# Patient Record
Sex: Female | Born: 1935 | Race: White | Hispanic: No | Marital: Married | Smoking: Never smoker
Health system: Southern US, Community
[De-identification: ages and names within clinical notes are randomized; demographics above are authoritative.]

## PROBLEM LIST (undated history)

## (undated) DIAGNOSIS — Z8669 Personal history of other diseases of the nervous system and sense organs: Secondary | ICD-10-CM

## (undated) DIAGNOSIS — K5190 Ulcerative colitis, unspecified, without complications: Secondary | ICD-10-CM

## (undated) DIAGNOSIS — B0229 Other postherpetic nervous system involvement: Secondary | ICD-10-CM

## (undated) DIAGNOSIS — I10 Essential (primary) hypertension: Secondary | ICD-10-CM

## (undated) DIAGNOSIS — I341 Nonrheumatic mitral (valve) prolapse: Secondary | ICD-10-CM

## (undated) DIAGNOSIS — H3530 Unspecified macular degeneration: Secondary | ICD-10-CM

## (undated) DIAGNOSIS — R002 Palpitations: Secondary | ICD-10-CM

## (undated) DIAGNOSIS — E785 Hyperlipidemia, unspecified: Secondary | ICD-10-CM

## (undated) DIAGNOSIS — C9000 Multiple myeloma not having achieved remission: Secondary | ICD-10-CM

## (undated) DIAGNOSIS — Z8619 Personal history of other infectious and parasitic diseases: Secondary | ICD-10-CM

## (undated) DIAGNOSIS — M1990 Unspecified osteoarthritis, unspecified site: Secondary | ICD-10-CM

## (undated) HISTORY — DX: Nonrheumatic mitral (valve) prolapse: I34.1

## (undated) HISTORY — DX: Essential (primary) hypertension: I10

## (undated) HISTORY — DX: Personal history of other infectious and parasitic diseases: Z86.19

## (undated) HISTORY — DX: Unspecified osteoarthritis, unspecified site: M19.90

## (undated) HISTORY — DX: Hyperlipidemia, unspecified: E78.5

## (undated) HISTORY — DX: Other postherpetic nervous system involvement: B02.29

## (undated) HISTORY — DX: Personal history of other diseases of the nervous system and sense organs: Z86.69

## (undated) HISTORY — DX: Multiple myeloma not having achieved remission: C90.00

## (undated) HISTORY — DX: Unspecified macular degeneration: H35.30

## (undated) HISTORY — DX: Palpitations: R00.2

## (undated) HISTORY — DX: Ulcerative colitis, unspecified, without complications: K51.90

---
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
## 1973-05-10 HISTORY — PX: BREAST BIOPSY: SHX20

## 2004-05-06 ENCOUNTER — Ambulatory Visit: Payer: Self-pay | Admitting: Gastroenterology

## 2004-08-18 ENCOUNTER — Ambulatory Visit: Payer: Self-pay | Admitting: Gastroenterology

## 2004-09-17 ENCOUNTER — Ambulatory Visit: Payer: Self-pay | Admitting: Gastroenterology

## 2004-09-28 ENCOUNTER — Ambulatory Visit: Payer: Self-pay | Admitting: Gastroenterology

## 2004-09-28 ENCOUNTER — Encounter (INDEPENDENT_AMBULATORY_CARE_PROVIDER_SITE_OTHER): Payer: Self-pay | Admitting: Specialist

## 2004-10-30 ENCOUNTER — Ambulatory Visit: Payer: Self-pay | Admitting: Gastroenterology

## 2007-05-26 ENCOUNTER — Ambulatory Visit: Payer: Self-pay | Admitting: Gastroenterology

## 2009-05-01 ENCOUNTER — Encounter: Payer: Self-pay | Admitting: Gastroenterology

## 2009-05-10 HISTORY — PX: COLONOSCOPY: SHX174

## 2009-10-14 ENCOUNTER — Ambulatory Visit: Payer: Self-pay | Admitting: Gastroenterology

## 2009-10-14 DIAGNOSIS — K5120 Ulcerative (chronic) proctitis without complications: Secondary | ICD-10-CM

## 2009-10-14 DIAGNOSIS — Z8679 Personal history of other diseases of the circulatory system: Secondary | ICD-10-CM | POA: Insufficient documentation

## 2009-10-14 DIAGNOSIS — K5130 Ulcerative (chronic) rectosigmoiditis without complications: Secondary | ICD-10-CM | POA: Insufficient documentation

## 2009-10-21 ENCOUNTER — Telehealth: Payer: Self-pay | Admitting: Gastroenterology

## 2009-11-03 ENCOUNTER — Encounter: Payer: Self-pay | Admitting: Gastroenterology

## 2009-11-13 ENCOUNTER — Ambulatory Visit: Payer: Self-pay | Admitting: Gastroenterology

## 2009-11-18 ENCOUNTER — Telehealth: Payer: Self-pay | Admitting: Gastroenterology

## 2009-11-19 ENCOUNTER — Encounter (INDEPENDENT_AMBULATORY_CARE_PROVIDER_SITE_OTHER): Payer: Self-pay | Admitting: *Deleted

## 2009-11-20 ENCOUNTER — Ambulatory Visit: Payer: Self-pay | Admitting: Gastroenterology

## 2009-11-24 ENCOUNTER — Ambulatory Visit: Payer: Self-pay | Admitting: Gastroenterology

## 2009-11-25 ENCOUNTER — Telehealth (INDEPENDENT_AMBULATORY_CARE_PROVIDER_SITE_OTHER): Payer: Self-pay | Admitting: *Deleted

## 2009-11-25 ENCOUNTER — Telehealth: Payer: Self-pay | Admitting: Gastroenterology

## 2009-11-26 ENCOUNTER — Encounter: Payer: Self-pay | Admitting: Gastroenterology

## 2010-05-08 ENCOUNTER — Ambulatory Visit: Payer: Self-pay | Admitting: Cardiology

## 2010-06-09 NOTE — Progress Notes (Signed)
Summary: Rowasa enema change  Phone Note Outgoing Call   Summary of Call: Received fax from [facility] requesting change from Rowasa enema to Mesalamine.  States pt can save $5.04 per year.  LM for pt to call to see if she wants to change. Initial call taken by: Alberteen Spindle RN,  October 21, 2009 11:47 AM  Follow-up for Phone Call        Talked with pt.  She does not want to change meds.  Pt wants to continue Rowasa. Follow-up by: Alberteen Spindle RN,  October 21, 2009 3:20 PM

## 2010-06-09 NOTE — Assessment & Plan Note (Signed)
Summary: F/U APPT...LSW.   History of Present Illness Visit Type: Follow-up Visit Primary GI MD: Verl Blalock MD [place] Primary Provider: Darlin Coco, MD Requesting Provider: n/a Chief Complaint: Follow up appointment, wants to discuss Rowasa and canasa medications History of Present Illness:   The Patient continues with blood and mucus in her stools and has been unable to tolerate Canasa suppositories, in fact, feels that these may be worsening her condition. She tolerates her half a Rowasa enema at bedtime well and continues on Azulfidine 1 g twice a day by mouth. She denies abdominal pain or systemic complaints. She continued to deny use of NSAIDs, alcohol, or cigarettes. She is on Benefiber daily MiraLax. Last colonoscopy exam was over 5 years ago. She denies fever, chills, upper gastrointestinal, or hepatobiliary complaints.   GI Review of Systems    Reports abdominal pain.      Denies acid reflux, belching, bloating, chest pain, dysphagia with liquids, dysphagia with solids, heartburn, loss of appetite, nausea, vomiting, vomiting blood, weight loss, and  weight gain.      Reports rectal bleeding.     Denies anal fissure, black tarry stools, change in bowel habit, constipation, diarrhea, diverticulosis, fecal incontinence, heme positive stool, hemorrhoids, irritable bowel syndrome, jaundice, light color stool, liver problems, and  rectal pain.    Current Medications (verified): 1)  Sulfasalazine 500 Mg Tabs (Sulfasalazine) .... Take 2 Tablet By Mouth Twice A Day 2)  Propranolol Hcl 20 Mg Tabs (Propranolol Hcl) .... Once Daily 3)  Multivitamins  Tabs (Multiple Vitamin) .... Once Daily 4)  Miralax  Pack (Polyethylene Glycol 3350) .... Once Daily 5)  Benefiber  Powd (Wheat Dextrin) .... Once Daily 6)  Canasa 1000 Mg  Supp (Mesalamine) .Marland Kitchen.. 1 Per Rectum Q Am 7)  Rowasa 4 Gm  (Mesalamine) .Marland Kitchen.. 1 Per Rectum Q Hs  Allergies (verified): 1)  ! Cortisone 2)  ! Codeine  Past  History:  Family History: Last updated: 10/14/2009 Melenoma: Father & Brother Family History of Heart Disease: Father & Mother  Social History: Last updated: 10/14/2009 Occupation: Retired Patient has never smoked.  Alcohol Use - no Daily Caffeine Use Illicit Drug Use - no  Past medical, surgical, family and social histories (including risk factors) reviewed for relevance to current acute and chronic problems.  Past Medical History: Reviewed history from 10/14/2009 and no changes required. Proctitis  Past Surgical History: Reviewed history from 10/14/2009 and no changes required. Breast Biopsy  Family History: Reviewed history from 10/14/2009 and no changes required. Melenoma: Father & Brother Family History of Heart Disease: Father & Mother  Social History: Reviewed history from 10/14/2009 and no changes required. Occupation: Retired Patient has never smoked.  Alcohol Use - no Daily Caffeine Use Illicit Drug Use - no  Review of Systems       The patient complains of fatigue.  The patient denies allergy/sinus, anemia, anxiety-new, arthritis/joint pain, back pain, blood in urine, breast changes/lumps, change in vision, confusion, cough, coughing up blood, depression-new, fainting, fever, headaches-new, hearing problems, heart murmur, heart rhythm changes, itching, menstrual pain, muscle pains/cramps, night sweats, nosebleeds, pregnancy symptoms, shortness of breath, skin rash, sleeping problems, sore throat, swelling of feet/legs, swollen lymph glands, thirst - excessive , urination - excessive , urination changes/pain, urine leakage, vision changes, and voice change.    Vital Signs:  Patient profile:   77 year old female Height:      63 inches Weight:      139 pounds BMI:  24.71 BSA:     1.66 Pulse rate:   64 / minute Pulse rhythm:   regular BP sitting:   128 / 78  (left arm)  Vitals Entered By: Whitman Deborra Medina) (November 13, 2009 8:53 AM)  Physical  Exam  General:  Well developed, well nourished, no acute distress.healthy appearing.  healthy appearing.   Head:  Normocephalic and atraumatic. Eyes:  PERRLA, no icterus.exam deferred to patient's ophthalmologist.  exam deferred to patient's ophthalmologist.   Abdomen:  Soft, nontender and nondistended. No masses, hepatosplenomegaly or hernias noted. Normal bowel sounds. Rectal:  Skin tags noted but no fissures or fistulae, masses or tenderness noted. Stool is brown normal color but is trace guaiac positive.hemoccult positive.  hemoccult positive.   Extremities:  No clubbing, cyanosis, edema or deformities noted. Neurologic:  Alert and  oriented x4;  grossly normal neurologically. Psych:  Alert and cooperative. Normal mood and affect.   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 Colonoscopy followup has been scheduled, and screening labs for review including sed rate and anemia profile.  Problem # 2:  MITRAL VALVE PROLAPSE, HX OF (ICD-V12.50) Assessment: Unchanged continue propanolol 20 mg a day as per Dr. Mare Ferrari.  Patient Instructions: 1)  We will let you know if you need to have labs drawn. 2)  Stop Azulfidine and suppositories. 3)  Begin Lialda 2 tabs daily. 4)  Please call back to sch a colonoscopy. 5)  The medication list was reviewed and reconciled.  All changed / newly prescribed medications were explained.  A complete medication list was provided to the patient / caregiver. 6)  Copy sent to : Dr. Darlin Coco.

## 2010-06-09 NOTE — Letter (Signed)
Summary: Patient Notice- Colon Biospy Results  [facility]  [address] [place], [place] [address]   Phone: [phone]  Fax: [phone]        November 26, 2009 MRN: [id number]    D[address] [place], [place]  [address]    Dear Ms. Clarey,  I am pleased to inform you that the biopsies taken during your recent colonoscopy did not show any evidence of cancer upon pathologic examination.  Additional information/recommendations:  __No further action is needed at this time.  Please follow-up with      your primary care physician for your other healthcare needs.  __Please call [phone] to schedule a return visit to review      your condition.  _X_Continue with the treatment plan as outlined on the day of your      exam.  __You should have a repeat colonoscopy examination for this problem           in _ years.  Please call us if you are having persistent problems or have questions about your condition that have not been fully answered at this time.  Sincerely,  Sable Feil MD [facility]   This letter has been electronically signed by your physician.  Appended Document: Patient Notice- Colon Biospy Results letter mailed.

## 2010-06-09 NOTE — Assessment & Plan Note (Signed)
Summary: Rectal Bleeding/dfs   History of Present Illness Visit Type: Follow-up Visit Primary GI MD: Verl Blalock MD [place] Primary Provider: Darlin Coco, MD Chief Complaint: Rectal bleeding x 3 months,  BRB & mucous in toilet History of Present Illness:   77 year old Caucasian female with a long history of posterior proctosigmoiditis in remission for several year long sulfasalazine 500 mg 2 tablets in the morning and one in the evening. She also in the past has responded to topical aminosalicylate therapy. She's had a flare of the last 3 weeks of her colitis with tenesmus and some minor rectal bleeding. She denies abdominal pain or any systemic complaints. She also denies use of NSAIDs, antibiotics, or other new medications. Her health is excellent excellent otherwise.   GI Review of Systems    Reports abdominal pain and  bloating.     Location of  Abdominal pain: LLQ.    Denies acid reflux, belching, chest pain, dysphagia with liquids, dysphagia with solids, heartburn, loss of appetite, nausea, vomiting, vomiting blood, weight loss, and  weight gain.      Reports constipation, hemorrhoids, and  rectal bleeding.     Denies anal fissure, black tarry stools, change in bowel habit, diarrhea, diverticulosis, fecal incontinence, heme positive stool, irritable bowel syndrome, jaundice, light color stool, liver problems, and  rectal pain. Preventive Screening-Counseling & Management  Alcohol-Tobacco     Smoking Status: never      Drug Use:  no.      Current Medications (verified): 1)  Sulfasalazine 500 Mg Tabs (Sulfasalazine) .... Take 2 Tablet By Mouth Twice A Day 2)  Propranolol Hcl 20 Mg Tabs (Propranolol Hcl) .... Once Daily 3)  Multivitamins  Tabs (Multiple Vitamin) .... Once Daily 4)  Miralax  Pack (Polyethylene Glycol 3350) .... Once Daily 5)  Benefiber  Powd (Wheat Dextrin) .... Once Daily  Allergies (verified): 1)  ! Cortisone 2)  ! Codeine  Past History:  Past  medical, surgical, family and social histories (including risk factors) reviewed for relevance to current acute and chronic problems.  Past Medical History: Proctitis  Past Surgical History: Breast Biopsy  Family History: Reviewed history and no changes required. Melenoma: Father & Brother Family History of Heart Disease: Father & Mother  Social History: Reviewed history and no changes required. Occupation: Retired Patient has never smoked.  Alcohol Use - no Daily Caffeine Use Illicit Drug Use - no Smoking Status:  never Drug Use:  no  Review of Systems       The patient complains of change in vision, muscle pains/cramps, and sleeping problems.  The patient denies allergy/sinus, anemia, anxiety-new, arthritis/joint pain, back pain, blood in urine, breast changes/lumps, confusion, cough, coughing up blood, depression-new, fainting, fatigue, fever, headaches-new, hearing problems, heart murmur, heart rhythm changes, itching, menstrual pain, night sweats, nosebleeds, pregnancy symptoms, shortness of breath, skin rash, sore throat, swelling of feet/legs, swollen lymph glands, thirst - excessive, urination - excessive, urination changes/pain, urine leakage, vision changes, and voice change.    Vital Signs:  Patient profile:   77 year old female Height:      63 inches Weight:      141.50 pounds BMI:     25.16 Pulse rate:   60 / minute Pulse rhythm:   regular BP sitting:   132 / 70  (right arm) Cuff size:   regular  Vitals Entered By: June McMurray Culver Deborra Medina) (October 14, 2009 2:06 PM)  Physical Exam  General:  Well developed, well nourished, no  acute distress.healthy appearing.   Head:  Normocephalic and atraumatic. Eyes:  PERRLA, no icterus.exam deferred to patient's ophthalmologist.   Neck:  Supple; no masses or thyromegaly. Lungs:  Clear throughout to auscultation. Heart:  Regular rate and rhythm; no murmurs, rubs,  or bruits. Abdomen:  Soft, nontender and nondistended. No  masses, hepatosplenomegaly or hernias noted. Normal bowel sounds. Rectal:  Normal exam.Bloody mucus present rectal vault without masses or tenderness. There is no evidence of fissuring or fistulae. Extremities:  No clubbing, cyanosis, edema or deformities noted. Neurologic:  Alert and  oriented x4;  grossly normal neurologically. Psych:  Alert and cooperative. Normal mood and affect.   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 She may well be due for followup colonoscopy exam, but compliance has been a problem in the past.  Problem # 2:  MITRAL VALVE PROLAPSE, HX OF (ICD-V12.50) Assessment: Unchanged I can appreciate a click on exam but no significant murmur. She certainly is asymptomatic in terms of any current or past cardiovascular problems. She relates that Dr. Mare Ferrari does lab work every 6 months, and we'll request that information including her CBC.  Patient Instructions: 1)  Begin Canasa suppository each morning for 2 weeks.Marland Kitchen 2)  Begin Rowasa enema at bedtime for 2 weeks. 3)  After 2 weeks you may use these meds just when needed. 4)  Increase Azulfidine to 2 tabs two times a day. 5)  Please schedule a follow-up appointment in 1 month.  6)  The medication list was reviewed and reconciled.  All changed / newly prescribed medications were explained.  A complete medication list was provided to the patient / caregiver. 7)  Copy sent to : Dr. Darlin Coco 8)  Please schedule a follow-up appointment in 4 to 6 weeks.  Prescriptions: SULFASALAZINE 500 MG TABS (SULFASALAZINE) Take 2 tablet by mouth twice a day  #120 x 6   Entered by:   Alberteen Spindle RN    Authorized by:   Sable Feil MD [facility]   Signed by:   Alberteen Spindle RN on 10/14/2009   Method used:   Electronically to        [facility] [address] [address] [facility]* (retail)       [address]       [place], [place]  [address]       Ph: [phone] or [phone]       Fax: [phone]   RxID:   [id number] ROWASA 4 GM  (MESALAMINE) 1 per rectum q hs  #30 x 6   Entered by:   Alberteen Spindle RN   Authorized by:   Sable Feil MD [facility]   Signed by:   Alberteen Spindle RN on 10/14/2009   Method used:   Faxed to ...       [facility]* (retail)       [address]       [place], [place]  [address]       Ph: [phone] or [phone]       Fax: [phone]   RxID:  [id number] CANASA 1000 MG  SUPP (MESALAMINE) 1 per rectum q am  #30 x 6   Entered by:   Alberteen Spindle RN   Authorized by:   Sable Feil MD [facility]   Signed by:   Alberteen Spindle RN on 10/14/2009   Method used:   Electronically to        [facility] (retail)       [address]       [place], [place]  [address]       Ph: [phone] or [phone]       Fax: [phone]   RxID:   [id number]

## 2010-06-09 NOTE — Progress Notes (Signed)
  Phone Note Call from Patient   Reason for Call: Talk to Nurse Summary of Call: Patient wanted explanation of colitis vs proctitis.  Discussed in detail and reassured patient that [name] would see her if she had a flare-up.  Reassured her that she should continue her medications as listed on the med rec form.  Patient verbally stated," I am satisfied." Initial call taken by: Silver Huguenin RN,  November 25, 2009 4:55 PM

## 2010-06-09 NOTE — Procedures (Signed)
Summary: Colonoscopy  Patient: [name] Note: All result statuses are Final unless otherwise noted.  Tests: (1) Colonoscopy (COL)   COL Colonoscopy           DONE (C)     [facility]     [place], [place]  [address]           COLONOSCOPY PROCEDURE REPORT           PATIENT:  Diana Brewer, Diana Brewer  MR#:  [id number]     BIRTHDATE:  1933-07-06, 75 yrs. old  GENDER:  female     ENDOSCOPIST:  Loralee Pacas. Sharlett Iles, MD, [facility]     REF. BY:  Loralee Pacas. Sharlett Iles, M.D.     PROCEDURE DATE:  11/24/2009     PROCEDURE:  Colonoscopy with biopsy     ASA CLASS:  Class II     INDICATIONS:  Colorectal cancer screening, average risk,     evaluation of ulcerative colitis     MEDICATIONS:   Fentanyl 50 mcg PO, Versed 4 mg IV           DESCRIPTION OF PROCEDURE:   After the risks benefits and     alternatives of the procedure were thoroughly explained, informed     consent was obtained.  Digital rectal exam was performed and     revealed no abnormalities.   The LB CF-[id number] endoscope     was introduced through the anus and advanced to the cecum, which     was identified by both the appendix and ileocecal valve, limited     by a redundant colon.    The quality of the prep was excellent,     using MoviPrep.  The instrument was then slowly withdrawn as the     colon was fully examined.     <<PROCEDUREIMAGES>>           FINDINGS:  There were mucosal changes consistent with left-sided     ulcerative colitis. red,granular,friable mucosa from 0-10 cm     biopsied.no ulcers or bleeding.r  No polyps or cancers were seen.     This was otherwise a normal examination of the colon.  Mild     diverticulosis was found in the sigmoid colon.   Retroflexed views     in the rectum revealed no abnormalities.    The scope was then     withdrawn from the patient and the procedure completed.           COMPLICATIONS:  None     ENDOSCOPIC IMPRESSION:     1) Colitis - left UC     2) No  polyps or cancers     3) Otherwise normal examination     4) Diverticulosis     minor PROCTITIS.     RECOMMENDATIONS:     1) Await biopsy results     2) Continue Surveillance     3) Continue current medications     REPEAT EXAM:  No           ______________________________     Loralee Pacas. Sharlett Iles, MD, [facility]           CC:  Darlin Coco, MD           n.     REVISED:  11/24/2009 09:25 AM     eSIGNED:   Loralee Pacas. Patterson at 11/24/2009 09:25 AM           Doristine Church,  [id number]  Note: An exclamation mark (!) indicates a result that was not dispersed into the flowsheet. Document Creation Date: 11/24/2009 9:26 AM _______________________________________________________________________  (1) Order result status: Final Collection or observation date-time: 11/24/2009 09:14 Requested date-time:  Receipt date-time:  Reported date-time:  Referring Physician:   Ordering Physician: Verl Blalock [phone]) Specimen Source:  Source: Tawanna Cooler Order Number: [id number] Lab site:

## 2010-06-09 NOTE — Miscellaneous (Signed)
Summary: Colon 11/26/09/dfs  Clinical Lists Changes  Medications: Added new medication of MOVIPREP 100 GM  SOLR (PEG-KCL-NACL-NASULF-NA ASC-C) As directed - Signed Rx of MOVIPREP 100 GM  SOLR (PEG-KCL-NACL-NASULF-NA ASC-C) As directed;  #1 x 0;  Signed;  Entered by: Ernestine Conrad RN;  Authorized by: Sable Feil MD [facility];  Method used: Electronically to [facility] [phone]*, [address] [place], [place], [place]  [address], Ph: [phone] or [phone], Fax: [phone] Allergies: Changed allergy or adverse reaction from CORTISONE to CORTISONE Changed allergy or adverse reaction from CODEINE to CODEINE    Prescriptions: MOVIPREP 100 GM  SOLR (PEG-KCL-NACL-NASULF-NA ASC-C) As directed  #1 x 0   Entered by:   Ernestine Conrad RN   Authorized by:   Sable Feil MD [facility]   Signed by:   Ernestine Conrad RN on 11/20/2009   Method used:   Electronically to        [facility] (retail)       [address]       [place], [place]  [address]       Ph: [phone] or [phone]       Fax: [phone]   RxID:   [id number]

## 2010-06-09 NOTE — Progress Notes (Signed)
Summary: Triage  Phone Note Call from Patient Call back at Home Phone [phone] Call back at [phone] [phone]   Caller: Patient Call For: [name] Reason for Call: Talk to Nurse Summary of Call: pt. has some questions about her procedure yesterday, meds. diagnosis, etc. Initial call taken by: Webb Laws,  November 25, 2009 8:20 AM  Follow-up for Phone Call        Attempted to call patient and the line was busy x 2.  Will attempt to call back later on today.    Patient called our message line with many questions about her diagnosis and treatments.   She said that we were supposed to call earlier which I did but the wrong # was on the sheet.   Pt is clear about what she is supposed to do.  She was also told to make an appt with [name] if she had any further questions about her care. Follow-up by: Ernestine Conrad RN,  November 25, 2009 8:41 AM

## 2010-06-09 NOTE — Letter (Signed)
Summary: [facility] Instructions   Gastroenterology  [place] [address], [place] [address]   Phone: [phone]  Fax: [phone]       Diana Brewer    [birth year]    MRN: [id number]        Procedure Day Diana Brewer:  Diana Brewer  11/24/09     Arrival Time:  8:00AM     Procedure Time:  9:00AM     Location of Procedure:                    [place] _  [place] (4th Floor)                        [place]   Starting 5 days prior to your procedure 11/19/09 do not eat nuts, seeds, popcorn, corn, beans, peas,  salads, or any raw vegetables.  Do not take any fiber supplements (e.g. Metamucil, Citrucel, and Benefiber).  THE DAY BEFORE YOUR PROCEDURE         DATE: 11/23/09  DAY: SUNDAY  1.  Drink clear liquids the entire day-NO SOLID FOOD  2.  Do not drink anything colored red or purple.  Avoid juices with pulp.  No orange juice.  3.  Drink at least 64 oz. (8 glasses) of fluid/clear liquids during the day to prevent dehydration and help the prep work efficiently.  CLEAR LIQUIDS INCLUDE: Water Jello Ice Popsicles Tea (sugar ok, no milk/cream) Powdered fruit flavored drinks Coffee (sugar ok, no milk/cream) Gatorade Juice: apple, white grape, white cranberry  Lemonade Clear bullion, consomm, broth Carbonated beverages (any kind) Strained chicken noodle soup Hard Candy                             4.  In the morning, mix first dose of MoviPrep solution:    Empty 1 Pouch A and 1 Pouch B into the disposable container    Add lukewarm drinking water to the top line of the container. Mix to dissolve    Refrigerate (mixed solution should be used within 24 hrs)  5.  Begin drinking the prep at 5:00 p.m. The MoviPrep container is divided by 4 marks.   Every 15 minutes drink the solution down to the next mark (approximately 8 oz) until the full liter is complete.   6.  Follow completed prep with 16 oz of clear liquid of your choice  (Nothing red or purple).  Continue to drink clear liquids until bedtime.  7.  Before going to bed, mix second dose of MoviPrep solution:    Empty 1 Pouch A and 1 Pouch B into the disposable container    Add lukewarm drinking water to the top line of the container. Mix to dissolve    Refrigerate  THE DAY OF YOUR PROCEDURE      DATE: 11/24/09  DAY: MONDAY  Beginning at 4:00AM (5 hours before procedure):         1. Every 15 minutes, drink the solution down to the next mark (approx 8 oz) until the full liter is complete.  2. Follow completed prep with 16 oz. of clear liquid of your choice.    3. You may drink clear liquids until 7:00AM (2 HOURS BEFORE PROCEDURE).   MEDICATION INSTRUCTIONS  Unless otherwise instructed, you should take regular prescription medications with a small sip of water   as early as possible the morning  of your procedure.           OTHER INSTRUCTIONS  You will need a responsible adult at least 75 years of age to accompany you and drive you home.   This person must remain in the waiting room during your procedure.  Wear loose fitting clothing that is easily removed.  Leave jewelry and other valuables at home.  However, you may wish to bring a book to read or  an iPod/MP3 player to listen to music as you wait for your procedure to start.  Remove all body piercing jewelry and leave at home.  Total time from sign-in until discharge is approximately 2-3 hours.  You should go home directly after your procedure and rest.  You can resume normal activities the  day after your procedure.  The day of your procedure you should not:   Drive   Make legal decisions   Operate machinery   Drink alcohol   Return to work  You will receive specific instructions about eating, activities and medications before you leave.    The above instructions have been reviewed and explained to me by   Ernestine Conrad, RN_______________________    I fully understand  and can verbalize these instructions _____________________________ Date _________

## 2010-06-09 NOTE — Progress Notes (Signed)
Summary: Triage  Phone Note Call from Patient   Caller: Patient Summary of Call: Pt called.  She does not feel that she can take tha lialda.  Symptoms are worse since starting the med.  Having more abd pain, loose stools, mucus in stools.  Incontenient at times.  Pt only took one lialda daily.  She was afraid to take 2 tabs.  Asks if she should go back to azulfidine.  Pt is using the rowase enema 1/2 at bedtime.  Pt will be having lab work drawn at PCP office and have results sent to [place]. Initial call taken by: Alberteen Spindle RN,  November 18, 2009 11:58 AM    Additional Follow-up for Phone Call Additional follow up Details #2::    ok...colon asap Follow-up by: Sable Feil MD Nita Sickle 12:31 PM  Additional Follow-up for Phone Call Additional follow up Details #3:: Details for Additional Follow-up Action Taken: Lm for pt to call Alberteen Spindle RN  November 19, 2009 10:26 AM  Previsit and colon scheduled.  Pt will resume azulfidine until colon is done. Additional Follow-up by: Alberteen Spindle RN,  November 19, 2009 11:10 AM

## 2010-07-29 ENCOUNTER — Other Ambulatory Visit: Payer: Self-pay | Admitting: *Deleted

## 2010-07-29 MED ORDER — SULFASALAZINE 500 MG PO TABS: 1000.0000 mg | ORAL_TABLET | Freq: Two times a day (BID) | ORAL | Status: DC

## 2010-09-22 NOTE — Assessment & Plan Note (Signed)
[name]                         GASTROENTEROLOGY OFFICE NOTE   NAME:BYRLEYMarlaysia, Diana Brewer                  MRN:          [id number]  DATE:05/26/2007                            DOB:          Mar 09, 1934    I have not seen [name] since June of 2006.  She was supposed to be on  Azulfidine 1 gram twice a day and she self dropped this back to 500 mg  twice a day and discontinued her prophylactic Rowasa enemas.  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  She sees  Dr. Mare Ferrari routinely and has blood work performed every 6 months and  I will ask him to send [place] a copy of this report.  In addition to her  Azulfidine, she is on daily Benefiber and Inderal 20 mg a day.   She is awake and alert, in no acute distress, appearing her stated age.  She weighs 135 pounds and blood pressure is 112/62 and pulse was 72 and  regular.  ABDOMINAL:  Benign without organomegaly, masses or significant  tenderness.  Inspection of the rectum was unremarkable as was rectal exam.  There was  soft stool present, it had a little bloody mucus on the surface.   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   RECOMMENDATIONS:  1. Increase Azulfidine to 1 gram twice a day.  2. Nightly Rowasa 4 gram enemas for the next month and then will try      to taper as tolerated to every other day and eventually every 3 day      prophylactic use.  3. I have asked Dr. Mare Ferrari to send [place] pertinent laboratory data.     Loralee Pacas. Sharlett Iles, MD, [place], [place]  Electronically Signed    DRP/MedQ  DD: 05/26/2007  DT: 05/26/2007  Job #: [id number]   cc:   Darlin Coco, M.D.

## 2010-11-17 ENCOUNTER — Telehealth: Payer: Self-pay | Admitting: Cardiology

## 2010-11-17 DIAGNOSIS — Z79899 Other long term (current) drug therapy: Secondary | ICD-10-CM

## 2010-11-17 DIAGNOSIS — E7800 Pure hypercholesterolemia, unspecified: Secondary | ICD-10-CM

## 2010-11-17 NOTE — Telephone Encounter (Signed)
Scheduled appointment

## 2010-11-17 NOTE — Telephone Encounter (Signed)
Patient needs appointment. Should have been seen in June.  Patient a little upset, needs to be seen.

## 2010-12-15 ENCOUNTER — Encounter: Payer: Self-pay | Admitting: Cardiology

## 2010-12-24 ENCOUNTER — Encounter: Payer: Self-pay | Admitting: Cardiology

## 2010-12-24 ENCOUNTER — Ambulatory Visit (INDEPENDENT_AMBULATORY_CARE_PROVIDER_SITE_OTHER): Payer: Medicare Other | Admitting: Cardiology

## 2010-12-24 ENCOUNTER — Ambulatory Visit (INDEPENDENT_AMBULATORY_CARE_PROVIDER_SITE_OTHER): Payer: Medicare Other | Admitting: *Deleted

## 2010-12-24 VITALS — BP 124/80 | HR 80 | Wt 140.0 lb

## 2010-12-24 DIAGNOSIS — E785 Hyperlipidemia, unspecified: Secondary | ICD-10-CM

## 2010-12-24 DIAGNOSIS — Z8679 Personal history of other diseases of the circulatory system: Secondary | ICD-10-CM

## 2010-12-24 DIAGNOSIS — R002 Palpitations: Secondary | ICD-10-CM | POA: Insufficient documentation

## 2010-12-24 DIAGNOSIS — E782 Mixed hyperlipidemia: Secondary | ICD-10-CM

## 2010-12-24 DIAGNOSIS — Z79899 Other long term (current) drug therapy: Secondary | ICD-10-CM

## 2010-12-24 DIAGNOSIS — E7800 Pure hypercholesterolemia, unspecified: Secondary | ICD-10-CM | POA: Insufficient documentation

## 2010-12-24 NOTE — Assessment & Plan Note (Signed)
Patient has a past history of significant hypercholesterolemia but because of her gastrointestinal problems has been unable or unwilling to take statin therapy.  She is trying to bring her cholesterol down with prudent diet.

## 2010-12-24 NOTE — Assessment & Plan Note (Signed)
The patient has had a history of mitral valve prolapse diagnosed initially in 1989.  Echocardiogram at that time showed mild mitral regurgitation.  Clinically the mitral valve regurgitation has not increased significantly over the years.

## 2010-12-24 NOTE — Progress Notes (Signed)
Diana Brewer Date of Birth:  29-Jul-1933 [facility] [address]   [place], [place]  [address] [phone]           Fax   [phone]  History of Present Illness: This pleasant 77 year old and is seen for a scheduled followup office visit.  She has a past history of palpitations and mild mitral valve prolapse.  She had an echocardiogram on 05/27/87 which showed an abnormal mitral valve with mild mitral valve prolapse and mild mitral regurgitation.  Patient also has a history of hypercholesterolemia.  She had lipids drawn in December of 2011 which included a total cholesterol of 235 and LDL cholesterol of 151.  Because of other medical issues the patient has not been able to take any statins.  The patient does have significant problems with chronic inflammatory proctitis and is followed closely by [name].Patient has not been expressing any recent chest pain or increased shortness of breath.  Current Outpatient Prescriptions  Medication Sig Dispense Refill  . Multiple Vitamins-Minerals (CENTRUM SILVER) tablet Take 1 tablet by mouth as needed.        . polyethylene glycol (MIRALAX / GLYCOLAX) packet Take 17 g by mouth daily.        . propranolol (INDERAL) 20 MG tablet Take 20 mg by mouth 2 (two) times daily.        Marland Kitchen sulfaSALAzine (AZULFIDINE) 500 MG tablet Take 2 tablets (1,000 mg total) by mouth 2 (two) times daily.  120 tablet  3  . Wheat Dextrin (BENEFIBER PO) Take by mouth as needed.          Allergies  Allergen Reactions  . Codeine     REACTION: swelling  . Cortisone     REACTION: nauseated  . Mucinex     Patient Active Problem List  Diagnoses  . ULCERATIVE PROCTITIS  . MITRAL VALVE PROLAPSE, HX OF    History  Smoking status  . Never Smoker   Smokeless tobacco  . Not on file    History  Alcohol Use No    Family History  Problem Relation Age of Onset  . Hypertension Mother   . Stroke Mother   . Heart disease  Father   . Arthritis Father     Review of Systems: Constitutional: no fever chills diaphoresis or fatigue or change in weight.  Head and neck: no hearing loss, no epistaxis, no photophobia or visual disturbance. Respiratory: No cough, shortness of breath or wheezing. Cardiovascular: No chest pain peripheral edema, palpitations. Gastrointestinal: No abdominal distention, no abdominal pain, no change in bowel habits hematochezia or melena. Genitourinary: No dysuria, no frequency, no urgency, no nocturia. Musculoskeletal:No arthralgias, no back pain, no gait disturbance or myalgias. Neurological: No dizziness, no headaches, no numbness, no seizures, no syncope, no weakness, no tremors. Hematologic: No lymphadenopathy, no easy bruising. Psychiatric: No confusion, no hallucinations, no sleep disturbance.    Physical Exam: Filed Vitals:   12/24/10 1601  BP: 124/80  Pulse: 80  The general appearance reveals a well-developed well-nourished woman in no distress.Pupils equal and reactive.   Extraocular Movements are full.  There is no scleral icterus.  The mouth and pharynx are normal.  The neck is supple.  The carotids reveal no bruits.  The jugular venous pressure is normal.  The thyroid is not enlarged.  There is no lymphadenopathy.  The chest is clear to percussion and auscultation. There are no rales or rhonchi. Expansion of the chest is symmetrical.  The heart reveals a soft midsystolic clickThe abdomen is soft and nontender. Bowel sounds are normal. The liver and spleen are not enlarged. There Are no abdominal masses. There are no bruits.  The pedal pulses are good.  There is no phlebitis or edema.  There is no cyanosis or clubbing.  Strength is normal and symmetrical in all extremities.  There is no lateralizing weakness.  There are no sensory deficits.  The skin is warm and dry.  There is no rash.      Assessment / Plan: The patient is to continue same medication.  Recheck in  6 months for followup office visit and lab work and EKG

## 2010-12-25 LAB — HEPATIC FUNCTION PANEL
ALT: 19 U/L (ref 0–35)
AST: 27 U/L (ref 0–37)
Alkaline Phosphatase: 97 U/L (ref 39–117)
Bilirubin, Direct: 0.1 mg/dL (ref 0.0–0.3)
Total Bilirubin: 0.9 mg/dL (ref 0.3–1.2)

## 2010-12-25 LAB — CBC WITH DIFFERENTIAL/PLATELET
Basophils Absolute: 0 10*3/uL (ref 0.0–0.1)
Eosinophils Absolute: 0.4 10*3/uL (ref 0.0–0.7)
Lymphocytes Relative: 24 % (ref 12.0–46.0)
Lymphs Abs: 1.9 10*3/uL (ref 0.7–4.0)
MCHC: 33.2 g/dL (ref 30.0–36.0)
Monocytes Relative: 9.2 % (ref 3.0–12.0)
Platelets: 282 10*3/uL (ref 150.0–400.0)
RDW: 14 % (ref 11.5–14.6)

## 2010-12-25 LAB — BASIC METABOLIC PANEL
BUN: 18 mg/dL (ref 6–23)
CO2: 30 mEq/L (ref 19–32)
Calcium: 9.6 mg/dL (ref 8.4–10.5)
GFR: 50.96 mL/min — ABNORMAL LOW (ref >60.00)
Glucose, Bld: 92 mg/dL (ref 70–99)

## 2010-12-29 ENCOUNTER — Telehealth: Payer: Self-pay | Admitting: Cardiology

## 2010-12-29 NOTE — Telephone Encounter (Signed)
PATIENT states that she is returning your call

## 2010-12-29 NOTE — Telephone Encounter (Signed)
Left message

## 2010-12-30 ENCOUNTER — Other Ambulatory Visit: Payer: Self-pay | Admitting: Cardiology

## 2010-12-30 ENCOUNTER — Telehealth: Payer: Self-pay | Admitting: *Deleted

## 2010-12-30 MED ORDER — PROPRANOLOL HCL 20 MG PO TABS: 20.0000 mg | ORAL_TABLET | Freq: Two times a day (BID) | ORAL | Status: DC

## 2010-12-30 NOTE — Telephone Encounter (Signed)
Message copied by Deniece Ree on Wed Dec 30, 2010  1:30 PM ------      Message from: Darlin Coco      Created: Sat Dec 26, 2010  7:44 PM       Lipids good.Cholest only sl high 214.  LDL 139.  Continue careful diet.LIVer and kidneys okay

## 2010-12-30 NOTE — Progress Notes (Signed)
Advised patient of labs

## 2010-12-30 NOTE — Telephone Encounter (Signed)
Advised patient of labs

## 2010-12-30 NOTE — Telephone Encounter (Signed)
Advised of labs 

## 2010-12-30 NOTE — Telephone Encounter (Signed)
Message copied by Deniece Ree on Wed Dec 30, 2010  1:28 PM ------      Message from: Darlin Coco      Created: Sat Dec 26, 2010  7:45 PM       CBC okay.  No anemia.

## 2010-12-30 NOTE — Telephone Encounter (Signed)
Pharmacy is [facility] [phone].  Pt states the pharmacy got a response from our office that she was not a patient.  Pt was going to pick up Propranolol and they stated not a patient.  Please call in script to [facility] and confirm.  Pt states there may have been a name mix up.  Please call pt to confirm there will be a prescription available to pick up.

## 2010-12-30 NOTE — Telephone Encounter (Signed)
Diana Brewer spoke with patient and gave lab results

## 2010-12-30 NOTE — Telephone Encounter (Signed)
Called pharmacy

## 2011-02-16 ENCOUNTER — Other Ambulatory Visit: Payer: Self-pay

## 2011-02-16 MED ORDER — SULFASALAZINE 500 MG PO TABS: 1000.0000 mg | ORAL_TABLET | Freq: Two times a day (BID) | ORAL | Status: DC

## 2011-06-30 ENCOUNTER — Encounter: Payer: Self-pay | Admitting: Cardiology

## 2011-06-30 ENCOUNTER — Ambulatory Visit (INDEPENDENT_AMBULATORY_CARE_PROVIDER_SITE_OTHER): Payer: Medicare Other | Admitting: Cardiology

## 2011-06-30 VITALS — BP 128/82 | HR 78 | Ht 62.0 in | Wt 143.0 lb

## 2011-06-30 DIAGNOSIS — K6289 Other specified diseases of anus and rectum: Secondary | ICD-10-CM | POA: Diagnosis not present

## 2011-06-30 DIAGNOSIS — R002 Palpitations: Secondary | ICD-10-CM

## 2011-06-30 DIAGNOSIS — E7800 Pure hypercholesterolemia, unspecified: Secondary | ICD-10-CM

## 2011-06-30 DIAGNOSIS — E785 Hyperlipidemia, unspecified: Secondary | ICD-10-CM

## 2011-06-30 DIAGNOSIS — K5120 Ulcerative (chronic) proctitis without complications: Secondary | ICD-10-CM

## 2011-06-30 LAB — LIPID PANEL
HDL: 59.8 mg/dL (ref >39.00)
VLDL: 23 mg/dL (ref 0.0–40.0)

## 2011-06-30 LAB — BASIC METABOLIC PANEL
BUN: 20 mg/dL (ref 6–23)
Chloride: 106 mEq/L (ref 96–112)
Glucose, Bld: 93 mg/dL (ref 70–99)
Potassium: 4.7 mEq/L (ref 3.5–5.1)
Sodium: 142 mEq/L (ref 135–145)

## 2011-06-30 LAB — CBC WITH DIFFERENTIAL/PLATELET
Eosinophils Relative: 3.3 % (ref 0.0–5.0)
HCT: 38.1 % (ref 36.0–46.0)
Hemoglobin: 12.7 g/dL (ref 12.0–15.0)
Lymphs Abs: 1.6 10*3/uL (ref 0.7–4.0)
MCV: 97.8 fl (ref 78.0–100.0)
Monocytes Absolute: 0.6 10*3/uL (ref 0.1–1.0)
Neutro Abs: 4.5 10*3/uL (ref 1.4–7.7)
Platelets: 302 10*3/uL (ref 150.0–400.0)
WBC: 7.1 10*3/uL (ref 4.5–10.5)

## 2011-06-30 LAB — HEPATIC FUNCTION PANEL
Bilirubin, Direct: 0.1 mg/dL (ref 0.0–0.3)
Total Bilirubin: 0.7 mg/dL (ref 0.3–1.2)

## 2011-06-30 LAB — LDL CHOLESTEROL, DIRECT: Direct LDL: 149.7 mg/dL

## 2011-06-30 NOTE — Assessment & Plan Note (Signed)
The patient has been doing well in terms of her proctitis.  She has one formed stool per day and she has not been seeing any blood in her stool.  She has an appointment soon with her gastroenterologist.

## 2011-06-30 NOTE — Assessment & Plan Note (Signed)
Since last visit to her frequency of palpitations has decreased.  She remains on long-term daily Inderal.

## 2011-06-30 NOTE — Assessment & Plan Note (Signed)
The patient has a history of hypercholesterolemia.  She is not on any statin therapy.  She has been able to decrease her lipids diet careful diet.  We're checking blood work today.

## 2011-06-30 NOTE — Progress Notes (Signed)
Diana Brewer Date of Birth:  1933-11-15 [facility] [address] [place], [place]  [address] [phone]         Fax   [phone]  History of Present Illness: This pleasant 78 year old woman is seen for a scheduled followup office visit.  She has a history of palpitations and a history of mitral valve prolapse.  She has a history of hypercholesterolemia.  She also has a history of chronic inflammatory proctitis.  Since last visit she has been doing reasonably well.  She has developed a problem with a bony spur in her left shoulder causing her discomfort.  She had been prescribed Voltaren gel by her orthopedic surgeon but when she read the potential side effects including cardiovascular problems she was reluctant to take it.  She was also concerned that it might change her gastrointestinal balance which is quite precarious.  We agreed that it would be better for her not to use the Voltaren gel but just use aspirin or Tylenol if necessary  Current Outpatient Prescriptions  Medication Sig Dispense Refill  . Multiple Vitamins-Minerals (CENTRUM SILVER) tablet Take 1 tablet by mouth as needed.        . polyethylene glycol (MIRALAX / GLYCOLAX) packet Take 17 g by mouth daily.        . propranolol (INDERAL) 20 MG tablet Take 1 tablet (20 mg total) by mouth 2 (two) times daily.  60 tablet  11  . sulfaSALAzine (AZULFIDINE) 500 MG tablet Take 2 tablets (1,000 mg total) by mouth 2 (two) times daily.  120 tablet  1  . Wheat Dextrin (BENEFIBER PO) Take by mouth as needed.        . VOLTAREN 1 % GEL as directed.        Allergies  Allergen Reactions  . Codeine     REACTION: swelling  . Cortisone     REACTION: nauseated  . Mucinex     Patient Active Problem List  Diagnoses  . ULCERATIVE PROCTITIS  . MITRAL VALVE PROLAPSE, HX OF  . Rapid palpitations  . Hypercholesterolemia    History  Smoking status  . Never Smoker   Smokeless tobacco  . Not on file     History  Alcohol Use No    Family History  Problem Relation Age of Onset  . Hypertension Mother   . Stroke Mother   . Heart disease Father   . Arthritis Father     Review of Systems: Constitutional: no fever chills diaphoresis or fatigue or change in weight.  Head and neck: no hearing loss, no epistaxis, no photophobia or visual disturbance. Respiratory: No cough, shortness of breath or wheezing. Cardiovascular: No chest pain peripheral edema, palpitations. Gastrointestinal: No abdominal distention, no abdominal pain, no change in bowel habits hematochezia or melena. Genitourinary: No dysuria, no frequency, no urgency, no nocturia. Musculoskeletal:No arthralgias, no back pain, no gait disturbance or myalgias. Neurological: No dizziness, no headaches, no numbness, no seizures, no syncope, no weakness, no tremors. Hematologic: No lymphadenopathy, no easy bruising. Psychiatric: No confusion, no hallucinations, no sleep disturbance.    Physical Exam: Filed Vitals:   06/30/11 0936  BP: 128/82  Pulse: 78   the general appearance reveals a well-developed well-nourished woman in no distress.Pupils equal and reactive.   Extraocular Movements are full.  There is no scleral icterus.  The mouth and pharynx are normal.  The neck is supple.  The carotids reveal no bruits.  The jugular venous pressure is normal.  The  thyroid is not enlarged.  There is no lymphadenopathy.  The chest is clear to percussion and auscultation. There are no rales or rhonchi. Expansion of the chest is symmetrical.  Heart reveals a soft apical murmur of mitral regurgitation.The abdomen is soft and nontender. Bowel sounds are normal. The liver and spleen are not enlarged. There Are no abdominal masses. There are no bruits.  The pedal pulses are good.  There is no phlebitis or edema.  There is no cyanosis or clubbing. Strength is normal and symmetrical in all extremities.  There is no lateralizing weakness.  There  are no sensory deficits.  The skin is warm and dry.  There is no rash.    Assessment / Plan: Continue same medication.  Recheck for office visit and lab work in 6 months.  Blood work today is pending

## 2011-06-30 NOTE — Patient Instructions (Signed)
Will obtain labs today and call you with the results  Your physician recommends that you continue on your current medications as directed. Please refer to the Current Medication list given to you today.  Your physician wants you to follow-up in: 6 months You will receive a reminder letter in the mail two months in advance. If you don't receive a letter, please call our office to schedule the follow-up appointment.   

## 2011-06-30 NOTE — Progress Notes (Signed)
Quick Note:  Please report to patient. The recent labs are stable. Continue same medication and careful diet.The cholesterol and LDL are still too high. CBC normal ______

## 2011-07-05 ENCOUNTER — Telehealth: Payer: Self-pay | Admitting: *Deleted

## 2011-07-05 NOTE — Telephone Encounter (Signed)
Message copied by Earvin Hansen on Mon Jul 05, 2011 10:38 AM ------      Message from: Darlin Coco      Created: Wed Jun 30, 2011  9:07 PM       Please report to patient.  The recent labs are stable. Continue same medication and careful diet.The cholesterol and LDL are still too high.  CBC normal

## 2011-07-05 NOTE — Telephone Encounter (Signed)
Mailed copy of labs and left message to call if any questions  

## 2011-07-07 ENCOUNTER — Encounter: Payer: Self-pay | Admitting: *Deleted

## 2011-07-08 ENCOUNTER — Encounter: Payer: Self-pay | Admitting: Gastroenterology

## 2011-07-08 ENCOUNTER — Ambulatory Visit (INDEPENDENT_AMBULATORY_CARE_PROVIDER_SITE_OTHER): Payer: Medicare Other | Admitting: Gastroenterology

## 2011-07-08 VITALS — BP 120/76 | HR 68 | Ht 62.5 in | Wt 145.0 lb

## 2011-07-08 DIAGNOSIS — K5190 Ulcerative colitis, unspecified, without complications: Secondary | ICD-10-CM | POA: Diagnosis not present

## 2011-07-08 MED ORDER — SULFASALAZINE 500 MG PO TABS: 1000.0000 mg | ORAL_TABLET | Freq: Two times a day (BID) | ORAL | Status: DC

## 2011-07-08 NOTE — Patient Instructions (Signed)
Your prescription(s) have been sent to you pharmacy.

## 2011-07-08 NOTE — Progress Notes (Signed)
This is a 78 year old Caucasian female with chronic left-sided ulcerative colitis currently in remission on Azulfidine 1 g a day. She is suppose to be on 1 g twice a day, but self medicates her dosage per her symptomatology. Currently she denies abdominal pain, rectal bleeding but does have some loose stooling related to MiraLax use. Patient denies systemic, upper GI, or hepatobiliary complaints. Her appetite is good and her weight is stable. She is followed by Dr. Darlin Coco for her cardiovascular issues, but denies having a primary care physician. Review of labs this month showed normal CBC and metabolic profile.   Current Medications, Allergies, Past Medical History, Past Surgical History, Family History and Social History were reviewed in [employer] record.  Pertinent Review of Systems Negative   Physical Exam: Healthy patient with blood pressure 120/76 and pulse 68 and regular. BMI is 26.1. I cannot appreciate stigmata of chronic liver disease, thyromegaly or lymphadenopathy. Chest is clear she appears to be in a regular rhythm without murmurs gallops or rubs. There is no organomegaly, abdominal masses or tenderness. Bowel sounds are normal. Mental status is normal, there is no peripheral edema, phlebitis, or swollen joints.    Assessment and Plan: Chronic left-sided ulcerative colitis in remission on low-dose maintenance therapy. I have reviewed her medications and we'll see her yearly or when necessary as needed. She is up-to-date on her colonoscopy exams. She does not want referral to primary care.  Copy to [employer] No diagnosis found.

## 2011-08-17 ENCOUNTER — Encounter (INDEPENDENT_AMBULATORY_CARE_PROVIDER_SITE_OTHER): Payer: Medicare Other | Admitting: Ophthalmology

## 2011-08-17 DIAGNOSIS — H43819 Vitreous degeneration, unspecified eye: Secondary | ICD-10-CM | POA: Diagnosis not present

## 2011-08-17 DIAGNOSIS — H2510 Age-related nuclear cataract, unspecified eye: Secondary | ICD-10-CM

## 2011-08-17 DIAGNOSIS — H3530 Unspecified macular degeneration: Secondary | ICD-10-CM | POA: Diagnosis not present

## 2012-01-05 ENCOUNTER — Other Ambulatory Visit (INDEPENDENT_AMBULATORY_CARE_PROVIDER_SITE_OTHER): Payer: Medicare Other

## 2012-01-05 ENCOUNTER — Encounter: Payer: Self-pay | Admitting: Cardiology

## 2012-01-05 ENCOUNTER — Ambulatory Visit (INDEPENDENT_AMBULATORY_CARE_PROVIDER_SITE_OTHER): Payer: Medicare Other | Admitting: Cardiology

## 2012-01-05 VITALS — BP 128/80 | HR 68 | Ht 62.0 in | Wt 142.0 lb

## 2012-01-05 DIAGNOSIS — K5120 Ulcerative (chronic) proctitis without complications: Secondary | ICD-10-CM | POA: Diagnosis not present

## 2012-01-05 DIAGNOSIS — Z8679 Personal history of other diseases of the circulatory system: Secondary | ICD-10-CM | POA: Diagnosis not present

## 2012-01-05 DIAGNOSIS — E7800 Pure hypercholesterolemia, unspecified: Secondary | ICD-10-CM

## 2012-01-05 DIAGNOSIS — R002 Palpitations: Secondary | ICD-10-CM | POA: Diagnosis not present

## 2012-01-05 DIAGNOSIS — R0989 Other specified symptoms and signs involving the circulatory and respiratory systems: Secondary | ICD-10-CM

## 2012-01-05 LAB — LIPID PANEL
HDL: 59 mg/dL (ref >39.00)
Triglycerides: 87 mg/dL (ref 0.0–149.0)
VLDL: 17.4 mg/dL (ref 0.0–40.0)

## 2012-01-05 LAB — BASIC METABOLIC PANEL
BUN: 21 mg/dL (ref 6–23)
Chloride: 105 mEq/L (ref 96–112)
Potassium: 4 mEq/L (ref 3.5–5.1)
Sodium: 141 mEq/L (ref 135–145)

## 2012-01-05 LAB — LDL CHOLESTEROL, DIRECT: Direct LDL: 128 mg/dL

## 2012-01-05 LAB — HEPATIC FUNCTION PANEL
Bilirubin, Direct: 0.1 mg/dL (ref 0.0–0.3)
Total Bilirubin: 0.7 mg/dL (ref 0.3–1.2)
Total Protein: 6.6 g/dL (ref 6.0–8.3)

## 2012-01-05 NOTE — Assessment & Plan Note (Signed)
The patient has a history of hypercholesterolemia.  She has declined statin therapy.  She is adhering to a heart healthy diet.  We are checking lab work today.  Her weight is down 1 pound since last visit.  She stays physically active working in her garden and she brings vegetables to the [employer] every Saturday morning.

## 2012-01-05 NOTE — Assessment & Plan Note (Signed)
The patient denies any recent chest pain.  She's had very little palpitations since last visit

## 2012-01-05 NOTE — Patient Instructions (Addendum)
Will obtain labs today and call you with the results (lp/bmet/hfp)  Your physician recommends that you continue on your current medications as directed. Please refer to the Current Medication list given to you today.  Your physician wants you to follow-up in: 6 months with fasting labs (lp/bmet/hfp)  You will receive a reminder letter in the mail two months in advance. If you don't receive a letter, please call our office to schedule the follow-up appointment.

## 2012-01-05 NOTE — Assessment & Plan Note (Signed)
The patient has been stable in terms of her ulcerative proctitis.  She needs a lot of vegetables and she is on daily MiraLax.  She has to avoid nuts.  She has not been having any hematochezia.

## 2012-01-05 NOTE — Progress Notes (Signed)
Quick Note:  Please report to patient. The recent labs are stable. Continue same medication and careful diet.Lipids are better. ______

## 2012-01-05 NOTE — Progress Notes (Signed)
Kennith Maes Date of Birth:  1933/06/25 [facility] [address] [place], [place]  [address] [phone]         Fax   [phone]  History of Present Illness: This pleasant 78 year old woman is seen for a scheduled followup office visit. She has a history of palpitations and a history of mitral valve prolapse. She has a history of hypercholesterolemia. She also has a history of chronic inflammatory proctitis. Since last visit she has been doing reasonably well. She has developed a problem with a bony spur in her left shoulder causing her discomfort.   Current Outpatient Prescriptions  Medication Sig Dispense Refill  . Multiple Vitamins-Minerals (CENTRUM SILVER) tablet Take 1 tablet by mouth as needed.        . polyethylene glycol (MIRALAX / GLYCOLAX) packet Take 17 g by mouth daily.        . propranolol (INDERAL) 20 MG tablet Take 1 tablet (20 mg total) by mouth 2 (two) times daily.  60 tablet  11  . sulfaSALAzine (AZULFIDINE) 500 MG tablet Take 2 tablets (1,000 mg total) by mouth 2 (two) times daily.  120 tablet  6  . Wheat Dextrin (BENEFIBER PO) Take by mouth as needed.          Allergies  Allergen Reactions  . Codeine     REACTION; swelling    . Cortisone     REACTION: nauseated  . Guaifenesin Er     Patient Active Problem List  Diagnosis  . ULCERATIVE PROCTITIS  . MITRAL VALVE PROLAPSE, HX OF  . Rapid palpitations  . Hypercholesterolemia  . Colitis, ulcerative chronic    History  Smoking status  . Never Smoker   Smokeless tobacco  . Not on file    History  Alcohol Use No    Family History  Problem Relation Age of Onset  . Hypertension Mother   . Stroke Mother   . Heart disease Father   . Arthritis Father   . Melanoma Father   . Melanoma Brother     Review of Systems: Constitutional: no fever chills diaphoresis or fatigue or change in weight.  Head and neck: no hearing loss, no epistaxis, no photophobia or visual  disturbance. Respiratory: No cough, shortness of breath or wheezing. Cardiovascular: No chest pain peripheral edema, palpitations. Gastrointestinal: No abdominal distention, no abdominal pain, no change in bowel habits hematochezia or melena. Genitourinary: No dysuria, no frequency, no urgency, no nocturia. Musculoskeletal:No arthralgias, no back pain, no gait disturbance or myalgias. Neurological: No dizziness, no headaches, no numbness, no seizures, no syncope, no weakness, no tremors. Hematologic: No lymphadenopathy, no easy bruising. Psychiatric: No confusion, no hallucinations, no sleep disturbance.    Physical Exam: Filed Vitals:   01/05/12 0843  BP: 128/80  Pulse: 68   the general appearance reveals a well-developed well-nourished woman in no distress.The head and neck exam reveals pupils equal and reactive.  Extraocular movements are full.  There is no scleral icterus.  The mouth and pharynx are normal.  The neck is supple.  The carotids reveal no bruits.  The jugular venous pressure is normal.  The  thyroid is not enlarged.  There is no lymphadenopathy.  The chest is clear to percussion and auscultation.  There are no rales or rhonchi.  Expansion of the chest is symmetrical.  The precordium is quiet.  The first heart sound is normal.  The second heart sound is physiologically split.  There is no murmur gallop rub or  click.  Rhythm is regular There is no abnormal lift or heave.  The abdomen is soft and nontender.  The bowel sounds are normal.  The liver and spleen are not enlarged.  There are no abdominal masses.  There are no abdominal bruits.  Extremities reveal good pedal pulses.  There is no phlebitis or edema.  There is no cyanosis or clubbing.  Strength is normal and symmetrical in all extremities.  There is no lateralizing weakness.  There are no sensory deficits.  The skin is warm and dry.  There is no rash.     Assessment / Plan: Continue same medication.  Recheck in 6 months  for office visit lipid panel hepatic function panel and basal metabolic panel

## 2012-01-06 ENCOUNTER — Telehealth: Payer: Self-pay | Admitting: *Deleted

## 2012-01-06 NOTE — Telephone Encounter (Signed)
Message copied by Earvin Hansen on Thu Jan 06, 2012  2:24 PM ------      Message from: Darlin Coco      Created: Wed Jan 05, 2012  9:29 PM       Please report to patient.  The recent labs are stable. Continue same medication and careful diet.Lipids are better.

## 2012-01-06 NOTE — Telephone Encounter (Signed)
Advised patient of lab results  

## 2012-02-14 ENCOUNTER — Other Ambulatory Visit: Payer: Self-pay | Admitting: Cardiology

## 2012-02-14 ENCOUNTER — Other Ambulatory Visit: Payer: Self-pay | Admitting: *Deleted

## 2012-02-14 MED ORDER — PROPRANOLOL HCL 20 MG PO TABS: 20.0000 mg | ORAL_TABLET | Freq: Two times a day (BID) | ORAL | Status: DC

## 2012-02-14 NOTE — Telephone Encounter (Signed)
Refilled Propranol.

## 2012-03-16 ENCOUNTER — Other Ambulatory Visit: Payer: Self-pay

## 2012-03-16 ENCOUNTER — Other Ambulatory Visit: Payer: Self-pay | Admitting: Cardiology

## 2012-03-16 MED ORDER — PROPRANOLOL HCL 20 MG PO TABS: 20.0000 mg | ORAL_TABLET | Freq: Two times a day (BID) | ORAL | Status: DC

## 2012-05-26 ENCOUNTER — Telehealth: Payer: Self-pay | Admitting: Nurse Practitioner

## 2012-05-26 ENCOUNTER — Other Ambulatory Visit: Payer: Self-pay | Admitting: *Deleted

## 2012-05-26 ENCOUNTER — Telehealth: Payer: Self-pay | Admitting: Cardiology

## 2012-05-26 MED ORDER — SULFASALAZINE 500 MG PO TABS: 1000.0000 mg | ORAL_TABLET | Freq: Two times a day (BID) | ORAL | Status: DC

## 2012-05-26 NOTE — Telephone Encounter (Signed)
New Problem:   Patient called in wanting to know if she could stop by today and have you take her BP.  Please call back.

## 2012-05-26 NOTE — Telephone Encounter (Signed)
Pt called stating that her bp remained high this afternoon, confirmed by bp cuff at local pharmacy - 905'W systolic.  She tells me that she has not been taking her propranolol bid, though it was previously Rx this way.  I rec that she take a second dose of propranolol 60m tonight and check her BP in a few hrs.  If she is still above 1797mg, she should call back as she may require ER eval for more intensive BP lowering.  She verbalized understanding.

## 2012-05-26 NOTE — Telephone Encounter (Signed)
Patient called stating that her blood pressure had been running up in the 160's yesterday and today with her home blood pressure cuff. Patient stated she used a wrist cuff, advised that those were not usually recommended by  Dr. Mare Ferrari. Patient stated her daughter brought her arm cuff blood pressure machine over this morning and her blood pressure was 167/101 heart rate 74.  Discussed with Dr Acie Fredrickson and he recommended patient cut back on her salt intake and to check blood pressure and keep a log.  Patient has had normal blood pressure readings at her office visits.  Did schedule an appointment for patient to see Cecille Rubin NP on Monday. Recommended that patient get an [employer] blood pressure machine to monitor her blood pressure.  If continues to be up she can go to be evaluated or call on call MD. Patient did tell me that her Propranolol she had been taking had expired (husband got wrong bottle)but last night and this am medication not expired. Advised patient she needed to dispose of her expired Propranolol. Patient verbalized understanding.

## 2012-05-29 ENCOUNTER — Encounter: Payer: Self-pay | Admitting: Nurse Practitioner

## 2012-05-29 ENCOUNTER — Ambulatory Visit (INDEPENDENT_AMBULATORY_CARE_PROVIDER_SITE_OTHER): Payer: Medicare Other | Admitting: Nurse Practitioner

## 2012-05-29 VITALS — BP 140/80 | HR 68 | Resp 20 | Ht 62.0 in | Wt 141.9 lb

## 2012-05-29 DIAGNOSIS — R030 Elevated blood-pressure reading, without diagnosis of hypertension: Secondary | ICD-10-CM

## 2012-05-29 DIAGNOSIS — IMO0001 Reserved for inherently not codable concepts without codable children: Secondary | ICD-10-CM

## 2012-05-29 NOTE — Patient Instructions (Addendum)
Continue with your current medicines. We are not going to make changes today.  Resume your normal activities  Monitor your blood pressure at home three times a day for the next 2 weeks.   Record your readings and bring to your next visit. If your readings are above 140 consistently - then we will add medicine  Limit sodium intake.  See Dr. Mare Ferrari next month as planned - we will get you a time scheduled today.   Call the [facility] office at [phone] if you have any questions, problems or concerns.

## 2012-05-29 NOTE — Progress Notes (Signed)
Dartha Lodge Date of Birth: 1934-04-23 Medical Record #[id number]  History of Present Illness: Ms. Diana Brewer is seen back today for a work in visit. She is seen for Dr. Mare Ferrari. She has HLD, MVP and palpitations. Last seen here in August and was doing ok.   She called last week with reports of elevated BP. Was using a wrist cuff. Has not had issues with her blood pressure here in the office noted. She noted that for perhaps one week she was taking a bottle of expired Propranolol.    She comes in today. She is here alone. She feels ok. She got a new BP cuff. It does correlate. She feels ok. Trying to not use salt but probably had had more recently. She is normally very active and usually walks at the mall. Took it "easy" over the past weekend. No chest pain. Not short of breath. Not dizzy or lightheaded. No swelling.   Current Outpatient Prescriptions on File Prior to Visit  Medication Sig Dispense Refill  . Multiple Vitamins-Minerals (CENTRUM SILVER) tablet Take 1 tablet by mouth as needed.        . polyethylene glycol (MIRALAX / GLYCOLAX) packet Take 17 g by mouth daily.        . propranolol (INDERAL) 20 MG tablet Take 1 tablet (20 mg total) by mouth 2 (two) times daily.  60 tablet  6  . sulfaSALAzine (AZULFIDINE) 500 MG tablet Take 2 tablets (1,000 mg total) by mouth 2 (two) times daily.  120 tablet  6  . Wheat Dextrin (BENEFIBER PO) Take by mouth as needed.          Allergies  Allergen Reactions  . Codeine     REACTION; swelling    . Cortisone     REACTION: nauseated  . Guaifenesin Er     Past Medical History  Diagnosis Date  . MVP (mitral valve prolapse)   . Palpitations   . Proctosigmoiditis   . HLD (hyperlipidemia)     Past Surgical History  Procedure Date  . Breast biopsy     History  Smoking status  . Never Smoker   Smokeless tobacco  . Not on file    History  Alcohol Use No    Family History  Problem Relation Age of Onset  . Hypertension Mother    . Stroke Mother   . Heart disease Father   . Arthritis Father   . Melanoma Father   . Melanoma Brother     Review of Systems: The review of systems is per the HPI.  All other systems were reviewed and are negative.  Physical Exam: BP 140/80  Pulse 68  Resp 20  Ht 5' 2"  (1.575 m)  Wt 141 lb 14.4 oz (64.365 kg)  BMI 25.95 kg/m2 Her machine reads 139/67.  Patient is very pleasant and in no acute distress. She looks younger than her stated age. Skin is warm and dry. Color is normal.  HEENT is unremarkable. Normocephalic/atraumatic. PERRL. Sclera are nonicteric. Neck is supple. No masses. No JVD. Lungs are clear. Cardiac exam shows a regular rate and rhythm. Abdomen is soft. Extremities are without edema. Gait and ROM are intact. No gross neurologic deficits noted.   LABORATORY DATA: N/A  Lab Results  Component Value Date   WBC 7.1 06/30/2011   HGB 12.7 06/30/2011   HCT 38.1 06/30/2011   PLT 302.0 06/30/2011   GLUCOSE 84 01/05/2012   CHOL 206* 01/05/2012   TRIG 87.0 01/05/2012  HDL 59.00 01/05/2012   LDLDIRECT 128.0 01/05/2012   ALT 23 01/05/2012   AST 24 01/05/2012   NA 141 01/05/2012   K 4.0 01/05/2012   CL 105 01/05/2012   CREATININE 1.2 01/05/2012   BUN 21 01/05/2012   CO2 29 01/05/2012     Assessment / Plan: 1. Elevated BP - minimally elevated. I would like for her to monitor over the next 2 weeks and cut back on her salt use. I have given her the ok to resume her normal activities.   2. MVP with palpitations - had used expired Propranolol - resolved.  We will see her back in 2 weeks with a blood pressure diary. No change in her medicines for now.   Patient is agreeable to this plan and will call if any problems develop in the interim.

## 2012-06-02 ENCOUNTER — Telehealth: Payer: Self-pay | Admitting: Cardiology

## 2012-06-02 NOTE — Telephone Encounter (Signed)
Patient does not really want to start anything else and thinks this going up is just being anxious about checking it so often. Patient would like to just check once a day between now and ov 06/12/12 and will call if elevated. Advised patient ok but to call if continues to go up

## 2012-06-02 NOTE — Telephone Encounter (Signed)
Add lisinopril 5 mg daily to help with BP.  Check BMET in 7-10 days

## 2012-06-02 NOTE — Telephone Encounter (Signed)
Pt calling re being in on Monday due to problem with BP problems, saw lori, has been in the 120's ever since , yesterday started going up, today 149 laying down  pls advise

## 2012-06-02 NOTE — Telephone Encounter (Signed)
Pt called concerned over BP elevation with reading this morning at rest of systolic 109. Pt seen 05/29/12 by Tera Helper., NP, who advised to take BP readings three times a day for two weeks, limit salt intake and follow back up with office. Readings from 05/29/12 through 06/01/12 (midday) in 604'V systolic. Last night and this morning systolic readings 409'W. Advised pt that she should continue instructions to take BP readings three times/day and log them, limit salt intake and take readings in resting state. Dr. Mare Ferrari will be advised of pt concerns also.

## 2012-06-02 NOTE — Telephone Encounter (Signed)
Agree with plan 

## 2012-06-09 ENCOUNTER — Ambulatory Visit (INDEPENDENT_AMBULATORY_CARE_PROVIDER_SITE_OTHER): Payer: Medicare Other | Admitting: *Deleted

## 2012-06-09 DIAGNOSIS — E7800 Pure hypercholesterolemia, unspecified: Secondary | ICD-10-CM | POA: Diagnosis not present

## 2012-06-09 LAB — LIPID PANEL
HDL: 53.7 mg/dL (ref >39.00)
VLDL: 23.8 mg/dL (ref 0.0–40.0)

## 2012-06-09 LAB — HEPATIC FUNCTION PANEL
Albumin: 4.3 g/dL (ref 3.5–5.2)
Alkaline Phosphatase: 78 U/L (ref 39–117)

## 2012-06-09 LAB — BASIC METABOLIC PANEL
Calcium: 9.5 mg/dL (ref 8.4–10.5)
GFR: 37.89 mL/min — ABNORMAL LOW (ref >60.00)
Glucose, Bld: 89 mg/dL (ref 70–99)
Sodium: 140 mEq/L (ref 135–145)

## 2012-06-09 NOTE — Progress Notes (Signed)
Quick Note:  Please make copy of labs for patient visit. ______ 

## 2012-06-12 ENCOUNTER — Ambulatory Visit (INDEPENDENT_AMBULATORY_CARE_PROVIDER_SITE_OTHER): Payer: Medicare Other | Admitting: Cardiology

## 2012-06-12 ENCOUNTER — Encounter: Payer: Self-pay | Admitting: Cardiology

## 2012-06-12 VITALS — BP 130/70 | HR 77 | Resp 18 | Ht 62.0 in | Wt 143.0 lb

## 2012-06-12 DIAGNOSIS — E7800 Pure hypercholesterolemia, unspecified: Secondary | ICD-10-CM

## 2012-06-12 DIAGNOSIS — Z8679 Personal history of other diseases of the circulatory system: Secondary | ICD-10-CM | POA: Diagnosis not present

## 2012-06-12 DIAGNOSIS — B029 Zoster without complications: Secondary | ICD-10-CM | POA: Diagnosis not present

## 2012-06-12 MED ORDER — FAMCICLOVIR 500 MG PO TABS: 500.0000 mg | ORAL_TABLET | Freq: Three times a day (TID) | ORAL | Status: DC

## 2012-06-12 NOTE — Progress Notes (Signed)
Diana Brewer Date of Birth:  1934/04/23 [facility] [address] [place], [place]  [address] [phone]         Fax   [phone]  History of Present Illness: This pleasant 79 year old woman is seen for a work in office visit.  She has developed a rash on her back and her left breast consistent with shingles. She has a history of palpitations and a history of mitral valve prolapse. She has a history of hypercholesterolemia. She also has a history of chronic inflammatory proctitis. Since last visit she has been doing reasonably well otherwise.  Her blood pressure has been labile and she has had her blood pressure machine calibrated by her druggist.   Current Outpatient Prescriptions  Medication Sig Dispense Refill  . famciclovir (FAMVIR) 500 MG tablet Take 1 tablet (500 mg total) by mouth 3 (three) times daily.  21 tablet  0  . Multiple Vitamins-Minerals (CENTRUM SILVER) tablet Take 1 tablet by mouth as needed.        . polyethylene glycol (MIRALAX / GLYCOLAX) packet Take 17 g by mouth daily.        . propranolol (INDERAL) 20 MG tablet Take 1 tablet (20 mg total) by mouth 2 (two) times daily.  60 tablet  6  . sulfaSALAzine (AZULFIDINE) 500 MG tablet Take 2 tablets (1,000 mg total) by mouth 2 (two) times daily.  120 tablet  6  . Wheat Dextrin (BENEFIBER PO) Take by mouth as needed.          Allergies  Allergen Reactions  . Codeine     REACTION; swelling    . Cortisone     REACTION: nauseated  . Guaifenesin Er     Patient Active Problem List  Diagnosis  . ULCERATIVE PROCTITIS  . MITRAL VALVE PROLAPSE, HX OF  . Rapid palpitations  . Hypercholesterolemia  . Colitis, ulcerative chronic  . Herpes zoster    History  Smoking status  . Never Smoker   Smokeless tobacco  . Not on file    History  Alcohol Use No    Family History  Problem Relation Age of Onset  . Hypertension Mother   . Stroke Mother   . Heart disease Father   . Arthritis  Father   . Melanoma Father   . Melanoma Brother     Review of Systems: Constitutional: no fever chills diaphoresis or fatigue or change in weight.  Head and neck: no hearing loss, no epistaxis, no photophobia or visual disturbance. Respiratory: No cough, shortness of breath or wheezing. Cardiovascular: No chest pain peripheral edema, palpitations. Gastrointestinal: No abdominal distention, no abdominal pain, no change in bowel habits hematochezia or melena. Genitourinary: No dysuria, no frequency, no urgency, no nocturia. Musculoskeletal:No arthralgias, no back pain, no gait disturbance or myalgias. Neurological: No dizziness, no headaches, no numbness, no seizures, no syncope, no weakness, no tremors. Hematologic: No lymphadenopathy, no easy bruising. Psychiatric: No confusion, no hallucinations, no sleep disturbance.    Physical Exam: Filed Vitals:   06/12/12 1405  BP: 130/70  Pulse: 77  Resp: 18   the general appearance reveals a well-developed well-nourished woman in no distress.The head and neck exam reveals pupils equal and reactive.  Extraocular movements are full.  There is no scleral icterus.  The mouth and pharynx are normal.  The neck is supple.  The carotids reveal no bruits.  The jugular venous pressure is normal.  The  thyroid is not enlarged.  There is no lymphadenopathy.  The chest is clear to percussion and auscultation.  There are no rales or rhonchi.  Expansion of the chest is symmetrical.  The precordium is quiet.  The first heart sound is normal.  The second heart sound is physiologically split.  There is no murmur gallop rub or click.  There is no abnormal lift or heave.  The abdomen is soft and nontender.  The bowel sounds are normal.  The liver and spleen are not enlarged.  There are no abdominal masses.  There are no abdominal bruits.  Extremities reveal good pedal pulses.  There is no phlebitis or edema.  There is no cyanosis or clubbing.  Strength is normal and  symmetrical in all extremities.  There is no lateralizing weakness.  There are no sensory deficits.  The skin is warm and dry.  There is a rash of shingles on the left breast and on the left upper posterior thorax.     Assessment / Plan: Continue same medication and generic Famvir 500 mg 3 times a day for one week.  Get extra rest. Recheck in 6 months for followup office visit lipid panel hepatic function panel and basal metabolic panel.

## 2012-06-12 NOTE — Assessment & Plan Note (Signed)
Recently the patient's blood pressure has been more labile and has been elevated at times.  About 5 days ago the patient noted some burning stinging discomfort on the lateral aspect of the left breast.  At about that same time she noted the outbreak of a rash which is consistent with herpes zoster she also has a few lesions to the left of her upper thoracic spine consistent with herpes zoster.  She has not had the shingles vaccine.  We will give her a 7 day course of generic Famvir 500 mg 3 times a day

## 2012-06-12 NOTE — Assessment & Plan Note (Signed)
The patient has a history of hypercholesterolemia.  She is treating this with careful diet.  Her recent blood work has been satisfactory

## 2012-06-12 NOTE — Patient Instructions (Signed)
Rx Famvir 500 mg tablet three times a day for 7 days, Rx sent to [facility]. Otherwise, continue same dose of medications   Your physician wants you to follow-up in: 6 months with fasting labs (lp/bmet/hfp) You will receive a reminder letter in the mail two months in advance. If you don't receive a letter, please call our office to schedule the follow-up appointment.

## 2012-06-12 NOTE — Assessment & Plan Note (Signed)
The patient has not been having a recent chest pain to suggest mitral valve prolapse or angina pectoris

## 2012-08-05 ENCOUNTER — Telehealth: Payer: Self-pay | Admitting: Physician Assistant

## 2012-08-05 ENCOUNTER — Other Ambulatory Visit: Payer: Self-pay | Admitting: Physician Assistant

## 2012-08-05 MED ORDER — AZITHROMYCIN 250 MG PO TABS: ORAL_TABLET | ORAL | Status: DC

## 2012-08-05 NOTE — Telephone Encounter (Signed)
Pt called because she was febrile, c/o general malaise and has a severe HA. Recently around a relative with the same symptoms. Taking all Rx as prescribed. Pt requested ABX.  Advised her if symptoms do not improve, go to Urgent Care or the ER. Sent in Rx for Z-pack.

## 2012-08-13 ENCOUNTER — Ambulatory Visit (INDEPENDENT_AMBULATORY_CARE_PROVIDER_SITE_OTHER): Payer: Medicare Other | Admitting: Family Medicine

## 2012-08-13 VITALS — BP 156/94 | HR 88 | Temp 98.0°F | Resp 16 | Ht 62.0 in | Wt 138.0 lb

## 2012-08-13 DIAGNOSIS — B9789 Other viral agents as the cause of diseases classified elsewhere: Secondary | ICD-10-CM | POA: Diagnosis not present

## 2012-08-13 DIAGNOSIS — B349 Viral infection, unspecified: Secondary | ICD-10-CM

## 2012-08-13 DIAGNOSIS — R110 Nausea: Secondary | ICD-10-CM

## 2012-08-13 LAB — POCT CBC
Hemoglobin: 12.3 g/dL (ref 12.2–16.2)
MCH, POC: 30.3 pg (ref 27–31.2)
MPV: 8.8 fL (ref 0–99.8)
POC Granulocyte: 4.1 (ref 2–6.9)
POC MID %: 8.6 %M (ref 0–12)
RBC: 4.06 M/uL (ref 4.04–5.48)
WBC: 6.3 10*3/uL (ref 4.6–10.2)

## 2012-08-13 MED ORDER — ONDANSETRON 4 MG PO TBDP: 4.0000 mg | ORAL_TABLET | Freq: Three times a day (TID) | ORAL | Status: DC | PRN

## 2012-08-13 NOTE — Progress Notes (Signed)
Subjective: 79 year old lady who is here with a history of 9 days of an illness. She started out with fever and chills. She was congested. The following day she called Dr. Sherryl Barters office and he placed her on phone on a Z-Pak for 5 days. She took that. She has not gotten better. She had headache and congestion and drainage initially. She's had some cough. She's had some nausea. She just mostly just feels sick still, and a fairly nonspecific fashion. She has had a coating on her since taking the as Zithromax. She wondered whether she could have had some kind of reaction to it. Her husband has not been ill. However grandchild has at times. She does not have a primary care doctor currently, used to see Dr. Mare Ferrari for primary care but he now just does cardiology.  She has a history of ulcerative proctitis. She had shingles 2 months ago. She is on Azulfidine for the bowels and Inderal for her mitral valve prolapse.  Objective: Somewhat pale appearing lady in no major distress but looks like she does not feel well. Her TMs both have some wax partially occluding the canals but otherwise okay. Throat was clear but she has a brownish coating on her tongue. That may be aggravating factors she's been having a lot of [employer] which could make it a brownish color. Her neck was supple without significant nodes. No carotid bruits. Chest is clear to auscultation. Heart regular without murmurs. Abdomen soft without masses or tenderness.  Assessment: Post viral syndrome with generalized malaise, slight cough, nausea, loss of appetite.  Plan: Check CBC and proceed from there  Results for orders placed in visit on 08/13/12  POCT CBC      Result Value Range   WBC 6.3  4.6 - 10.2 K/uL   Lymph, poc 1.7  0.6 - 3.4   POC LYMPH PERCENT 26.7  10 - 50 %L   MID (cbc) 0.5  0 - 0.9   POC MID % 8.6  0 - 12 %M   POC Granulocyte 4.1  2 - 6.9   Granulocyte percent 64.7  37 - 80 %G   RBC 4.06  4.04 - 5.48 M/uL   Hemoglobin 12.3  12.2 - 16.2 g/dL   HCT, POC 40.2  37.7 - 47.9 %   MCV 99.0 (*) 80 - 97 fL   MCH, POC 30.3  27 - 31.2 pg   MCHC 30.6 (*) 31.8 - 35.4 g/dL   RDW, POC 13.9     Platelet Count, POC 317  142 - 424 K/uL   MPV 8.8  0 - 99.8 fL   I think this represents a viral syndrome. We will just have her treated symptomatically with something for nausea, Zofran, and given a few more days.

## 2012-08-13 NOTE — Patient Instructions (Addendum)
Viral Syndrome You or your child has Viral Syndrome. It is the most common infection causing "colds" and infections in the nose, throat, sinuses, and breathing tubes. Sometimes the infection causes nausea, vomiting, or diarrhea. The germ that causes the infection is a virus. No antibiotic or other medicine will kill it. There are medicines that you can take to make you or your child more comfortable.  HOME CARE INSTRUCTIONS   Rest in bed until you start to feel better.  If you have diarrhea or vomiting, eat small amounts of crackers and toast. Soup is helpful.  Do not give aspirin or medicine that contains aspirin to children.  Only take over-the-counter or prescription medicines for pain, discomfort, or fever as directed by your caregiver. SEEK IMMEDIATE MEDICAL CARE IF:   You or your child has not improved within one week.  You or your child has pain that is not at least partially relieved by over-the-counter medicine.  Thick, colored mucus or blood is coughed up.  Discharge from the nose becomes thick yellow or green.  Diarrhea or vomiting gets worse.  There is any major change in your or your child's condition.  You or your child develops a skin rash, stiff neck, severe headache, or are unable to hold down food or fluid.  You or your child has an oral temperature above 102 F (38.9 C), not controlled by medicine.  Your baby is older than 3 months with a rectal temperature of 102 F (38.9 C) or higher.  Your baby is 87 months old or younger with a rectal temperature of 100.4 F (38 C) or higher. Document Released: 04/11/2006 Document Revised: 07/19/2011 Document Reviewed: 04/12/2007 [facility] Patient Information 2013 [place].

## 2012-08-16 ENCOUNTER — Ambulatory Visit (INDEPENDENT_AMBULATORY_CARE_PROVIDER_SITE_OTHER): Payer: Medicare Other | Admitting: Ophthalmology

## 2012-09-04 ENCOUNTER — Ambulatory Visit (INDEPENDENT_AMBULATORY_CARE_PROVIDER_SITE_OTHER): Payer: Medicare Other | Admitting: Ophthalmology

## 2012-09-04 DIAGNOSIS — H43819 Vitreous degeneration, unspecified eye: Secondary | ICD-10-CM

## 2012-09-04 DIAGNOSIS — H3530 Unspecified macular degeneration: Secondary | ICD-10-CM | POA: Diagnosis not present

## 2012-09-04 DIAGNOSIS — H2510 Age-related nuclear cataract, unspecified eye: Secondary | ICD-10-CM

## 2012-12-18 ENCOUNTER — Other Ambulatory Visit: Payer: Self-pay | Admitting: Cardiology

## 2012-12-27 ENCOUNTER — Other Ambulatory Visit (INDEPENDENT_AMBULATORY_CARE_PROVIDER_SITE_OTHER): Payer: Medicare Other

## 2012-12-27 ENCOUNTER — Ambulatory Visit (INDEPENDENT_AMBULATORY_CARE_PROVIDER_SITE_OTHER): Payer: Medicare Other | Admitting: Cardiology

## 2012-12-27 ENCOUNTER — Encounter: Payer: Self-pay | Admitting: Cardiology

## 2012-12-27 VITALS — BP 162/84 | HR 72 | Ht 61.0 in | Wt 145.0 lb

## 2012-12-27 DIAGNOSIS — E7800 Pure hypercholesterolemia, unspecified: Secondary | ICD-10-CM

## 2012-12-27 DIAGNOSIS — R002 Palpitations: Secondary | ICD-10-CM | POA: Diagnosis not present

## 2012-12-27 DIAGNOSIS — B0229 Other postherpetic nervous system involvement: Secondary | ICD-10-CM

## 2012-12-27 DIAGNOSIS — I119 Hypertensive heart disease without heart failure: Secondary | ICD-10-CM | POA: Insufficient documentation

## 2012-12-27 HISTORY — DX: Other postherpetic nervous system involvement: B02.29

## 2012-12-27 LAB — HEPATIC FUNCTION PANEL
ALT: 48 U/L — ABNORMAL HIGH (ref 0–35)
AST: 40 U/L — ABNORMAL HIGH (ref 0–37)
Bilirubin, Direct: 0 mg/dL (ref 0.0–0.3)
Total Bilirubin: 0.6 mg/dL (ref 0.3–1.2)

## 2012-12-27 LAB — BASIC METABOLIC PANEL
BUN: 18 mg/dL (ref 6–23)
Calcium: 9.4 mg/dL (ref 8.4–10.5)
Creatinine, Ser: 1.3 mg/dL — ABNORMAL HIGH (ref 0.4–1.2)
GFR: 43.79 mL/min — ABNORMAL LOW (ref >60.00)
Glucose, Bld: 84 mg/dL (ref 70–99)

## 2012-12-27 LAB — LIPID PANEL: Triglycerides: 91 mg/dL (ref 0.0–149.0)

## 2012-12-27 MED ORDER — HYDROCHLOROTHIAZIDE 12.5 MG PO CAPS: 12.5000 mg | ORAL_CAPSULE | ORAL | Status: DC

## 2012-12-27 NOTE — Progress Notes (Signed)
Quick Note:  Please report to patient. The recent labs are stable. Continue same medication and careful diet. Total cholesterol is down although LDL is not much lower. Two of the LFTs are slightly elevated. This may be from fatty liver from gaining weight. We should recheck LFTs at her next visit. ______

## 2012-12-27 NOTE — Assessment & Plan Note (Signed)
The patient has not been experiencing any recent rapid palpitations.  She remains on propranolol twice a day.

## 2012-12-27 NOTE — Assessment & Plan Note (Signed)
Blood pressure today on arrival was elevated.  I rechecked it and it is still high in the range of 160/86.  She does enjoy eating salt.  We will add hydrochlorothiazide 12.5 mg every other day to her regimen and we have recommended that she cut back on her dietary salt intake also.  She has a blood pressure machine at home and she will check her blood pressure about once a week.  If she checks it too frequently she gets nervous and excited.

## 2012-12-27 NOTE — Patient Instructions (Addendum)
Will obtain labs today and call you with the results (lp/bmet/hfp)  Your physician recommends that you schedule a follow-up appointment in: 2 month ov/bmet  START HCTZ 12.5 MG EVERY OTHER DAY, RX SENT TO PHARMACY

## 2012-12-27 NOTE — Assessment & Plan Note (Signed)
Patient has a past history of hypercholesterolemia.  She has declined statin therapy in the past.  We are checking lab work today.

## 2012-12-27 NOTE — Progress Notes (Signed)
Diana Brewer Date of Birth:  Jul 16, 1933 [facility] [address] [place], [place]  [address] [phone]         Fax   [phone]  History of Present Illness: This pleasant 79 year old woman is seen for a work in office visit.  When last seen 6 months ago she had developed herpes zoster in the area of her left breast.  She did receive Famvir for it.  She is experiencing ongoing postherpetic neuralgia. She has a history of palpitations and a history of mitral valve prolapse. She has a history of hypercholesterolemia. She also has a history of chronic inflammatory proctitis. Since last visit she has been doing reasonably well otherwise. Her blood pressure has been labile and she has had her blood pressure machine calibrated by her druggist.   Current Outpatient Prescriptions  Medication Sig Dispense Refill  . Multiple Vitamins-Minerals (CENTRUM SILVER) tablet Take 1 tablet by mouth as needed.        . ondansetron (ZOFRAN ODT) 4 MG disintegrating tablet Take 1 tablet (4 mg total) by mouth every 8 (eight) hours as needed for nausea.  15 tablet  0  . polyethylene glycol (MIRALAX / GLYCOLAX) packet Take 17 g by mouth daily.        . propranolol (INDERAL) 20 MG tablet TAKE ONE TABLET BY MOUTH TWICE DAILY  60 tablet  0  . sulfaSALAzine (AZULFIDINE) 500 MG tablet Take 2 tablets (1,000 mg total) by mouth 2 (two) times daily.  120 tablet  6  . Wheat Dextrin (BENEFIBER PO) Take by mouth as needed.        . hydrochlorothiazide (MICROZIDE) 12.5 MG capsule Take 1 capsule (12.5 mg total) by mouth every other day.  15 capsule  5   No current facility-administered medications for this visit.    Allergies  Allergen Reactions  . Codeine     REACTION; swelling    . Cortisone     REACTION: nauseated  . Guaifenesin Er     Patient Active Problem List   Diagnosis Date Noted  . Postherpetic neuralgia 12/27/2012  . Benign hypertensive heart disease without heart failure  12/27/2012  . Herpes zoster 06/12/2012  . Colitis, ulcerative chronic 07/08/2011  . Rapid palpitations 12/24/2010  . Hypercholesterolemia 12/24/2010  . ULCERATIVE PROCTITIS 10/14/2009  . MITRAL VALVE PROLAPSE, HX OF 10/14/2009    History  Smoking status  . Never Smoker   Smokeless tobacco  . Not on file    History  Alcohol Use No    Family History  Problem Relation Age of Onset  . Hypertension Mother   . Stroke Mother   . Heart disease Father   . Arthritis Father   . Melanoma Father   . Melanoma Brother     Review of Systems: Constitutional: no fever chills diaphoresis or fatigue or change in weight.  Head and neck: no hearing loss, no epistaxis, no photophobia or visual disturbance. Respiratory: No cough, shortness of breath or wheezing. Cardiovascular: No chest pain peripheral edema, palpitations. Gastrointestinal: No abdominal distention, no abdominal pain, no change in bowel habits hematochezia or melena. Genitourinary: No dysuria, no frequency, no urgency, no nocturia. Musculoskeletal:No arthralgias, no back pain, no gait disturbance or myalgias. Neurological: No dizziness, no headaches, no numbness, no seizures, no syncope, no weakness, no tremors. Hematologic: No lymphadenopathy, no easy bruising. Psychiatric: No confusion, no hallucinations, no sleep disturbance.    Physical Exam: Filed Vitals:   12/27/12 1107  BP: 162/84  Pulse: 72  the general appearance reveals a well-developed well-nourished woman in no distress.  Her weight is up 2 pounds since last visit.The head and neck exam reveals pupils equal and reactive.  Extraocular movements are full.  There is no scleral icterus.  The mouth and pharynx are normal.  The neck is supple.  The carotids reveal no bruits.  The jugular venous pressure is normal.  The  thyroid is not enlarged.  There is no lymphadenopathy.  The chest is clear to percussion and auscultation.  There are no rales or rhonchi.  Expansion  of the chest is symmetrical.  The precordium is quiet.  The first heart sound is normal.  The second heart sound is physiologically split.  There is no murmur gallop rub or click.  There is no abnormal lift or heave.  The abdomen is soft and nontender.  The bowel sounds are normal.  The liver and spleen are not enlarged.  There are no abdominal masses.  There are no abdominal bruits.  Extremities reveal good pedal pulses.  There is no phlebitis or edema.  There is no cyanosis or clubbing.  Strength is normal and symmetrical in all extremities.  There is no lateralizing weakness.  There are no sensory deficits.  The skin is warm and dry.  There is no rash. She reports residual scarring on her left breast from previous herpes zoster.     Assessment / Plan: Continue same medication.  Add HCTZ 12.5 mg every other day.  We're checking full lab work today fasting.  Recheck in 2 months for followup office visit and basal metabolic panel

## 2013-01-05 ENCOUNTER — Telehealth: Payer: Self-pay | Admitting: Cardiology

## 2013-01-05 NOTE — Telephone Encounter (Signed)
Called patient with lab results.  

## 2013-01-05 NOTE — Telephone Encounter (Signed)
F/ up  Pt returning a call about results.Diana Brewer

## 2013-01-22 ENCOUNTER — Other Ambulatory Visit: Payer: Self-pay | Admitting: Cardiology

## 2013-01-26 ENCOUNTER — Telehealth: Payer: Self-pay | Admitting: Cardiology

## 2013-01-26 DIAGNOSIS — J029 Acute pharyngitis, unspecified: Secondary | ICD-10-CM

## 2013-01-26 MED ORDER — AZITHROMYCIN 250 MG PO TABS: ORAL_TABLET | ORAL | Status: DC

## 2013-01-26 NOTE — Telephone Encounter (Signed)
Okay for her to have a Z-Pak. Her allergies indicates that she does not tolerate Mucinex.

## 2013-01-26 NOTE — Telephone Encounter (Signed)
Husband and granddaughter both have bronchitis and she would like something phoned in. When tried to explain preventative was not something usually done and asked about symptoms stated she was having scratchy throat. Will forward to  Dr. Mare Ferrari for review

## 2013-01-26 NOTE — Telephone Encounter (Signed)
Advised patient

## 2013-01-26 NOTE — Telephone Encounter (Signed)
New Problem  Pt states there has been ana Outbreak of bronchitis within her home/// needs help to prevent.

## 2013-02-19 ENCOUNTER — Other Ambulatory Visit: Payer: Self-pay | Admitting: Cardiology

## 2013-02-20 ENCOUNTER — Other Ambulatory Visit: Payer: Self-pay | Admitting: Gastroenterology

## 2013-02-20 NOTE — Telephone Encounter (Signed)
PATIENT WILL NEED AN OFFICE VISIT FOR FURTHER REFILLS  

## 2013-02-26 ENCOUNTER — Other Ambulatory Visit: Payer: Medicare Other

## 2013-02-26 ENCOUNTER — Encounter: Payer: Self-pay | Admitting: Cardiology

## 2013-02-26 ENCOUNTER — Ambulatory Visit (INDEPENDENT_AMBULATORY_CARE_PROVIDER_SITE_OTHER): Payer: Medicare Other | Admitting: Cardiology

## 2013-02-26 VITALS — BP 130/78 | HR 74 | Wt 136.0 lb

## 2013-02-26 DIAGNOSIS — B0229 Other postherpetic nervous system involvement: Secondary | ICD-10-CM | POA: Diagnosis not present

## 2013-02-26 DIAGNOSIS — I119 Hypertensive heart disease without heart failure: Secondary | ICD-10-CM | POA: Diagnosis not present

## 2013-02-26 DIAGNOSIS — R945 Abnormal results of liver function studies: Secondary | ICD-10-CM

## 2013-02-26 DIAGNOSIS — R7989 Other specified abnormal findings of blood chemistry: Secondary | ICD-10-CM

## 2013-02-26 DIAGNOSIS — R002 Palpitations: Secondary | ICD-10-CM

## 2013-02-26 DIAGNOSIS — K5120 Ulcerative (chronic) proctitis without complications: Secondary | ICD-10-CM

## 2013-02-26 LAB — BASIC METABOLIC PANEL
BUN: 16 mg/dL (ref 6–23)
CO2: 29 mEq/L (ref 19–32)
Calcium: 9.5 mg/dL (ref 8.4–10.5)
Creatinine, Ser: 1.2 mg/dL (ref 0.4–1.2)
GFR: 45 mL/min — ABNORMAL LOW (ref >60.00)
Glucose, Bld: 88 mg/dL (ref 70–99)
Sodium: 142 mEq/L (ref 135–145)

## 2013-02-26 LAB — HEPATIC FUNCTION PANEL
ALT: 16 U/L (ref 0–35)
AST: 22 U/L (ref 0–37)
Bilirubin, Direct: 0.1 mg/dL (ref 0.0–0.3)
Total Bilirubin: 0.9 mg/dL (ref 0.3–1.2)
Total Protein: 6.8 g/dL (ref 6.0–8.3)

## 2013-02-26 NOTE — Assessment & Plan Note (Signed)
The patient has not been expressing any rapid palpitations.  Occasionally at night she will feel her heart beat beating forcefully in her ear when she is trying to sleep but is not going fast.

## 2013-02-26 NOTE — Assessment & Plan Note (Signed)
At her last visit her we noted that her LFTs were mildly elevated which was new for her.  We are getting repeat basal metabolic panel and hepatic function panel today.  She is not having any new GI symptoms.

## 2013-02-26 NOTE — Patient Instructions (Signed)
Will obtain labs today and call you with the results (bmet/hfp)  Your physician recommends that you continue on your current medications as directed. Please refer to the Current Medication list given to you today.  Your physician wants you to follow-up in: 6 months with fasting labs (lp/bmet/hfp) You will receive a reminder letter in the mail two months in advance. If you don't receive a letter, please call our office to schedule the follow-up appointment.

## 2013-02-26 NOTE — Assessment & Plan Note (Signed)
Blood pressure has stabilized since adding low-dose alternate day HCTZ.  She is not having any headaches or dizzy spells.  She strides to stay active and that she and her husband walk every morning for exercise.  She has lost 9 pounds since last visit intentionally

## 2013-02-26 NOTE — Progress Notes (Signed)
Diana Brewer Date of Birth:  [birth year] [address] [place], [place]  [address] [phone]         Fax   [phone]  History of Present Illness: This pleasant 79 year old woman is seen for a followup office visit.  6 months ago she developed a typical severe left chest rash consistent with herpes zoster.  She was treated promptly with Famvir. She has a history of palpitations and a history of mitral valve prolapse. She has a history of hypercholesterolemia. She also has a history of chronic inflammatory proctitis. Since last visit she has been doing reasonably well otherwise.  At her last visit on 12/27/12 she was noted to be hypertensive and was started on HCTZ 12.5 mg every other day.  She returns now for followup.  Current Outpatient Prescriptions  Medication Sig Dispense Refill  . hydrochlorothiazide (MICROZIDE) 12.5 MG capsule Take 1 capsule (12.5 mg total) by mouth every other day.  15 capsule  5  . Multiple Vitamins-Minerals (CENTRUM SILVER) tablet Take 1 tablet by mouth daily.       . ondansetron (ZOFRAN ODT) 4 MG disintegrating tablet Take 1 tablet (4 mg total) by mouth every 8 (eight) hours as needed for nausea.  15 tablet  0  . polyethylene glycol (MIRALAX / GLYCOLAX) packet Take 17 g by mouth daily.        . propranolol (INDERAL) 20 MG tablet TAKE 1 TABLET BY MOUTH TWICE DAILY  60 tablet  0  . sulfaSALAzine (AZULFIDINE) 500 MG tablet 2 TAB PO QD      . Wheat Dextrin (BENEFIBER PO) Take by mouth as needed.         No current facility-administered medications for this visit.    Allergies  Allergen Reactions  . Codeine     REACTION; swelling    . Cortisone     REACTION: nauseated  . Guaifenesin Er     Patient Active Problem List   Diagnosis Date Noted  . Abnormal LFTs (liver function tests) 02/26/2013  . Postherpetic neuralgia 12/27/2012  . Benign hypertensive heart disease without heart failure 12/27/2012  . Herpes zoster 06/12/2012  .  Colitis, ulcerative chronic 07/08/2011  . Rapid palpitations 12/24/2010  . Hypercholesterolemia 12/24/2010  . ULCERATIVE PROCTITIS 10/14/2009  . MITRAL VALVE PROLAPSE, HX OF 10/14/2009    History  Smoking status  . Never Smoker   Smokeless tobacco  . Not on file    History  Alcohol Use No    Family History  Problem Relation Age of Onset  . Hypertension Mother   . Stroke Mother   . Heart disease Father   . Arthritis Father   . Melanoma Father   . Melanoma Brother     Review of Systems: Constitutional: no fever chills diaphoresis or fatigue or change in weight.  Head and neck: no hearing loss, no epistaxis, no photophobia or visual disturbance. Respiratory: No cough, shortness of breath or wheezing. Cardiovascular: No chest pain peripheral edema, palpitations. Gastrointestinal: No abdominal distention, no abdominal pain, no change in bowel habits hematochezia or melena. Genitourinary: No dysuria, no frequency, no urgency, no nocturia. Musculoskeletal:No arthralgias, no back pain, no gait disturbance or myalgias. Neurological: No dizziness, no headaches, no numbness, no seizures, no syncope, no weakness, no tremors. Hematologic: No lymphadenopathy, no easy bruising. Psychiatric: No confusion, no hallucinations, no sleep disturbance.    Physical Exam: Filed Vitals:   02/26/13 1053  BP: 130/78  Pulse:    the general appearance  reveals a well-developed well-nourished woman in no distress.The head and neck exam reveals pupils equal and reactive.  Extraocular movements are full.  There is no scleral icterus.  The mouth and pharynx are normal.  The neck is supple.  The carotids reveal no bruits.  The jugular venous pressure is normal.  The  thyroid is not enlarged.  There is no lymphadenopathy.  The chest is clear to percussion and auscultation.  There are no rales or rhonchi.  Expansion of the chest is symmetrical.  The precordium is quiet.  The first heart sound is normal.   The second heart sound is physiologically split.  There is no murmur gallop rub or click.  There is no abnormal lift or heave.  The abdomen is soft and nontender.  The bowel sounds are normal.  The liver and spleen are not enlarged.  There are no abdominal masses.  There are no abdominal bruits.  Extremities reveal good pedal pulses.  There is no phlebitis or edema.  There is no cyanosis or clubbing.  Strength is normal and symmetrical in all extremities.  There is no lateralizing weakness.  There are no sensory deficits. The patient does experience hyperesthesia of the left arm secondary to previous herpes zoster.  The skin is warm and dry.    EKG today shows normal sinus rhythm and is within normal limits   Assessment / Plan: Continue same medication.  Blood work today pending.  Recheck in 6 months for office visit fasting lipid panel hepatic function panel and basal metabolic panel.

## 2013-02-26 NOTE — Progress Notes (Signed)
Quick Note:  Please report to patient. The recent labs are stable. Continue same medication and careful diet. Liver test are back to normal. ______

## 2013-02-27 ENCOUNTER — Telehealth: Payer: Self-pay | Admitting: Cardiology

## 2013-02-27 ENCOUNTER — Telehealth: Payer: Self-pay | Admitting: *Deleted

## 2013-02-27 NOTE — Telephone Encounter (Signed)
Mailed copy of labs and left message to call if any questions  

## 2013-02-27 NOTE — Telephone Encounter (Signed)
Message copied by Earvin Hansen on Tue Feb 27, 2013  9:47 AM ------      Message from: Darlin Coco      Created: Mon Feb 26, 2013  7:30 PM       Please report to patient.  The recent labs are stable. Continue same medication and careful diet. Liver test are back to normal. ------

## 2013-02-27 NOTE — Telephone Encounter (Signed)
New Problem  Pt called // Concerned about the report on her liver// requesting a call back to discuss.

## 2013-02-27 NOTE — Telephone Encounter (Signed)
Advised patient of lab results  

## 2013-04-26 ENCOUNTER — Encounter: Payer: Self-pay | Admitting: Family Medicine

## 2013-04-26 ENCOUNTER — Ambulatory Visit (INDEPENDENT_AMBULATORY_CARE_PROVIDER_SITE_OTHER): Payer: Medicare Other | Admitting: Family Medicine

## 2013-04-26 VITALS — BP 160/74 | HR 80 | Temp 97.7°F | Ht 61.0 in | Wt 135.5 lb

## 2013-04-26 DIAGNOSIS — E7800 Pure hypercholesterolemia, unspecified: Secondary | ICD-10-CM

## 2013-04-26 DIAGNOSIS — K5190 Ulcerative colitis, unspecified, without complications: Secondary | ICD-10-CM | POA: Diagnosis not present

## 2013-04-26 DIAGNOSIS — I119 Hypertensive heart disease without heart failure: Secondary | ICD-10-CM | POA: Diagnosis not present

## 2013-04-26 MED ORDER — HYDROCHLOROTHIAZIDE 12.5 MG PO CAPS: 12.5000 mg | ORAL_CAPSULE | ORAL | Status: DC

## 2013-04-26 NOTE — Assessment & Plan Note (Signed)
Stable on current regimen, continue.  Followed by GI.

## 2013-04-26 NOTE — Assessment & Plan Note (Signed)
She has tried to reduce salt intake and is compliant with current antihypertensive regimen managed by cardiology. discusesd elevated blood pressure today -a dvised to start monitoring at home and if consistently elevated to update [place] or cardiology for a change (would likely increase hctz to once daily). Pt agrees with plan.

## 2013-04-26 NOTE — Progress Notes (Signed)
Subjective:    Patient ID: Diana Brewer, female    DOB: Sep 01, 1933, 79 y.o.   MRN: [id number]  HPI CC: new pt to establish  HTN - elevated since after shingles case last year. HLD - not on meds. Both these things are followed by Dr. Mare Ferrari. Pt wants to get most of her care through Dr. Mare Ferrari. H/o shingles 2013 - treated with antiviral and has had elevated blood pressure since. 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  Preventative: No recent wellness exam. Does not want to pursue medicare wellness currently but will consider it  Medications and allergies reviewed and updated in chart.  Past histories reviewed and updated if relevant as below. Patient Active Problem List   Diagnosis Date Noted  . Abnormal LFTs (liver function tests) 02/26/2013  . Postherpetic neuralgia 12/27/2012  . Benign hypertensive heart disease without heart failure 12/27/2012  . Herpes zoster 06/12/2012  . Colitis, ulcerative chronic 07/08/2011  . Rapid palpitations 12/24/2010  . Hypercholesterolemia 12/24/2010  . ULCERATIVE PROCTITIS 10/14/2009  . MITRAL VALVE PROLAPSE, HX OF 10/14/2009   Past Medical History  Diagnosis Date  . MVP (mitral valve prolapse)   . Palpitations   . Hypertension   . HLD (hyperlipidemia)   . Ulcerative colitis     left sided - proctosigmoiditis ([name])  . Macular degeneration     [name]  . History of shingles 2013  . History of migraine remote   Past Surgical History  Procedure Laterality Date  . Breast biopsy  1975  . Tracheostomy  1942    due to Strep Throat (?epiglottitis)   History  Substance Use Topics  . Smoking status: Never Smoker   . Smokeless tobacco: Never Used  . Alcohol Use: No   Family History  Problem Relation Age of Onset  . Hypertension Mother   . Stroke Mother   . Heart disease Mother   . Rheum arthritis Father   . Melanoma Father   . Melanoma Brother   . Cancer Paternal Grandfather    lung   Allergies  Allergen Reactions  . Codeine     REACTION; swelling    . Cortisone     REACTION: nauseated  . Guaifenesin Er     Doesn't work, made her sick   Current Outpatient Prescriptions on File Prior to Visit  Medication Sig Dispense Refill  . hydrochlorothiazide (MICROZIDE) 12.5 MG capsule Take 1 capsule (12.5 mg total) by mouth every other day.  15 capsule  5  . Multiple Vitamins-Minerals (CENTRUM SILVER) tablet Take 1 tablet by mouth daily.       . polyethylene glycol (MIRALAX / GLYCOLAX) packet Take 17 g by mouth daily.        . propranolol (INDERAL) 20 MG tablet TAKE 1 TABLET BY MOUTH TWICE DAILY  60 tablet  0  . sulfaSALAzine (AZULFIDINE) 500 MG tablet 2 TAB PO QD      . Wheat Dextrin (BENEFIBER PO) Take by mouth as needed.         No current facility-administered medications on file prior to visit.    Review of Systems Per HPI    Objective:   Physical Exam  Nursing note and vitals reviewed. Constitutional: She is oriented to person, place, and time. She appears well-developed and well-nourished. No distress.  HENT:  Head: Normocephalic and atraumatic.  Right Ear: Hearing, external ear and ear canal normal.  Left Ear: Hearing, tympanic membrane, external ear and ear canal normal.  Nose: Nose normal.  Mouth/Throat: Oropharynx is clear and moist. No oropharyngeal exudate.  Crumen covering R TM  Eyes: Conjunctivae and EOM are normal. Pupils are equal, round, and reactive to light. No scleral icterus.  Neck: Normal range of motion. Neck supple. Carotid bruit is not present.  Cardiovascular: Normal rate, regular rhythm, normal heart sounds and intact distal pulses.   No murmur heard. Pulses:      Radial pulses are 2+ on the right side, and 2+ on the left side.  Pulmonary/Chest: Effort normal and breath sounds normal. No respiratory distress. She has no wheezes. She has no rales.  Musculoskeletal: Normal range of motion. She exhibits no edema.  Lymphadenopathy:      She has no cervical adenopathy.  Neurological: She is alert and oriented to person, place, and time.  CN grossly intact, station and gait intact  Skin: Skin is warm and dry. No rash noted.  Psychiatric: She has a normal mood and affect. Her behavior is normal. Judgment and thought content normal.       Assessment & Plan:

## 2013-04-26 NOTE — Patient Instructions (Addendum)
Give [place] a call for next wellness exam. Your blood pressure is staying elevated - keep an eye at home and let us or Dr. Sherryl Barters office know if it's staying consistently >140/90.   Nice to meet you today, call us with questions. Call your insurance about the shingles shot to see if it is covered or how much it would cost and where is cheaper (here or pharmacy).  If you want to receive here, call for nurse visit.

## 2013-04-26 NOTE — Assessment & Plan Note (Signed)
Mild off meds.

## 2013-04-26 NOTE — Progress Notes (Signed)
Pre-visit discussion using our clinic review tool. No additional management support is needed unless otherwise documented below in the visit note.  

## 2013-04-27 ENCOUNTER — Other Ambulatory Visit: Payer: Self-pay | Admitting: Cardiology

## 2013-06-13 ENCOUNTER — Telehealth: Payer: Self-pay | Admitting: Gastroenterology

## 2013-06-13 NOTE — Telephone Encounter (Signed)
Patient made a follow up visit for medication refill

## 2013-06-21 ENCOUNTER — Encounter: Payer: Self-pay | Admitting: Gastroenterology

## 2013-06-21 ENCOUNTER — Ambulatory Visit (INDEPENDENT_AMBULATORY_CARE_PROVIDER_SITE_OTHER): Payer: Medicare Other | Admitting: Gastroenterology

## 2013-06-21 VITALS — BP 122/70 | HR 74 | Ht 61.0 in | Wt 135.4 lb

## 2013-06-21 DIAGNOSIS — K5120 Ulcerative (chronic) proctitis without complications: Secondary | ICD-10-CM | POA: Diagnosis not present

## 2013-06-21 DIAGNOSIS — K5900 Constipation, unspecified: Secondary | ICD-10-CM | POA: Diagnosis not present

## 2013-06-21 MED ORDER — SULFASALAZINE 500 MG PO TABS: ORAL_TABLET | ORAL | Status: DC

## 2013-06-21 NOTE — Patient Instructions (Signed)
Follow up in one year with Dr. Hilarie Fredrickson   We have sent the following medications to your pharmacy for you to pick up at your convenience: Sulfasalazine 500 mg tablet

## 2013-06-21 NOTE — Progress Notes (Signed)
This is a very nice 80 year old Caucasian female with ulcerative proctitis currently in remission on Azulfidine 500 mg twice a day.  She denies any rectal bleeding, and in fact has mild constipation requiring occasional Benefiber with every other day MiraLax.  She has not had a relapse of IBD in over 2 years.  She is up-to-date on her colonoscopy.  She is followed closely by Dr. Darlin Coco for mitral valve prolapse, and lab review shows no evidence of anemia metabolic or liver abnormalities.  Her appetite is good and her weight is stable regular diet.  Last colonoscopy was July of 2011.  Family history is noncontributory.  Patient has never smoked and does not use alcohol or NSAIDs.  Current Medications, Allergies, Past Medical History, Past Surgical History, Family History and Social History were reviewed in [employer] record.  ROS: All systems were reviewed and are negative unless otherwise stated in the HPI.          Physical Exam: Blood pressure 122/70, pulse 74 and regular, and weight 135 with BMI of 25.60.  Cannot appreciate stigmata of chronic liver disease.  Her chest is clear she appear to be in a regular rhythm without murmurs gallops or rubs.  Her abdomen shows no organomegaly, masses or tenderness.  Bowel sounds are normal.  Mental status is normal.  Peripheral extremities are unremarkable.    Assessment and Plan: 80 year-old chronic ulcerative proctitis in remission on low-dose aminosalicylate therapy.  She seems to be under fairly good constipation regime at this time, and I've suggested to her that she take daily Benefiber possible.  I do not think she needs colonoscopy other labs performed at this time.  We will continue her medications as listed and reviewed with followup yearly with Dr. Hilarie Fredrickson or as needed.  CC; Dr. Darlin Coco

## 2013-08-19 ENCOUNTER — Other Ambulatory Visit: Payer: Self-pay | Admitting: Cardiology

## 2013-08-30 ENCOUNTER — Other Ambulatory Visit: Payer: Medicare Other

## 2013-08-30 ENCOUNTER — Ambulatory Visit (INDEPENDENT_AMBULATORY_CARE_PROVIDER_SITE_OTHER): Payer: Medicare Other | Admitting: Cardiology

## 2013-08-30 ENCOUNTER — Encounter: Payer: Self-pay | Admitting: Cardiology

## 2013-08-30 VITALS — BP 138/78 | HR 69 | Ht 61.0 in | Wt 136.0 lb

## 2013-08-30 DIAGNOSIS — B0229 Other postherpetic nervous system involvement: Secondary | ICD-10-CM

## 2013-08-30 DIAGNOSIS — Z8679 Personal history of other diseases of the circulatory system: Secondary | ICD-10-CM

## 2013-08-30 DIAGNOSIS — R002 Palpitations: Secondary | ICD-10-CM

## 2013-08-30 DIAGNOSIS — E7800 Pure hypercholesterolemia, unspecified: Secondary | ICD-10-CM | POA: Diagnosis not present

## 2013-08-30 DIAGNOSIS — I119 Hypertensive heart disease without heart failure: Secondary | ICD-10-CM | POA: Diagnosis not present

## 2013-08-30 LAB — LIPID PANEL
CHOL/HDL RATIO: 4
Cholesterol: 193 mg/dL (ref 0–200)
HDL: 55 mg/dL (ref >39.00)
LDL CALC: 124 mg/dL — AB (ref 0–99)
Triglycerides: 71 mg/dL (ref 0.0–149.0)
VLDL: 14.2 mg/dL (ref 0.0–40.0)

## 2013-08-30 LAB — BASIC METABOLIC PANEL
BUN: 24 mg/dL — AB (ref 6–23)
CHLORIDE: 107 meq/L (ref 96–112)
CO2: 29 mEq/L (ref 19–32)
Calcium: 9.4 mg/dL (ref 8.4–10.5)
Creatinine, Ser: 1.3 mg/dL — ABNORMAL HIGH (ref 0.4–1.2)
GFR: 42.54 mL/min — AB (ref >60.00)
GLUCOSE: 89 mg/dL (ref 70–99)
Potassium: 4.1 mEq/L (ref 3.5–5.1)
Sodium: 142 mEq/L (ref 135–145)

## 2013-08-30 LAB — HEPATIC FUNCTION PANEL
ALT: 15 U/L (ref 0–35)
AST: 21 U/L (ref 0–37)
Albumin: 4 g/dL (ref 3.5–5.2)
Alkaline Phosphatase: 69 U/L (ref 39–117)
BILIRUBIN DIRECT: 0 mg/dL (ref 0.0–0.3)
BILIRUBIN TOTAL: 0.6 mg/dL (ref 0.3–1.2)
Total Protein: 6.3 g/dL (ref 6.0–8.3)

## 2013-08-30 NOTE — Assessment & Plan Note (Signed)
Patient has mitral valve prolapse.  She has not been having any recent palpitations or cardiac chest pain.  Her energy level is good.

## 2013-08-30 NOTE — Assessment & Plan Note (Signed)
The patient is still having intermittent postherpetic neuralgia in the area of the left breast

## 2013-08-30 NOTE — Assessment & Plan Note (Signed)
Her blood pressure was a little high on arrival today.  She has not taken her blood pressure pills today.  On recheck her blood pressure had come down to normal.  She has not been having any headaches or dizzy spells.  She has been trying to walk more.  Her weight is unchanged.

## 2013-08-30 NOTE — Progress Notes (Signed)
Quick Note:  Please report to patient. The recent labs are stable. Continue same medication and careful diet. LFTs are normal. Kidneys dry. Drink more water.   ______

## 2013-08-30 NOTE — Progress Notes (Signed)
Diana Brewer Date of Birth:  1933-12-09 [address] [place], [place]  [address] [phone]         Fax   [phone]  History of Present Illness: This pleasant 80 year old woman is seen for a followup office visit.  12 months ago she developed a typical severe left chest rash consistent with herpes zoster.  She was treated promptly with Famvir.  She still has some postherpetic neuralgia. She has a history of palpitations and a history of mitral valve prolapse. She has a history of hypercholesterolemia. She also has a history of chronic inflammatory proctitis. Since last visit she has been doing reasonably well otherwise.  At her last visit on 12/27/12 she was noted to be hypertensive and was started on HCTZ 12.5 mg every other day.  She also takes propranolol every day.  Current Outpatient Prescriptions  Medication Sig Dispense Refill  . hydrochlorothiazide (MICROZIDE) 12.5 MG capsule Take 1 capsule (12.5 mg total) by mouth every other day.  15 capsule  6  . Multiple Vitamins-Minerals (CENTRUM SILVER) tablet Take 1 tablet by mouth daily.       . polyethylene glycol (MIRALAX / GLYCOLAX) packet Take 17 g by mouth daily.        . propranolol (INDERAL) 20 MG tablet TAKE 1 TABLET BY MOUTH TWICE DAILY  60 tablet  0  . sulfaSALAzine (AZULFIDINE) 500 MG tablet Take two tablets by mouth once daily  60 tablet  6  . Wheat Dextrin (BENEFIBER PO) Take by mouth as needed.         No current facility-administered medications for this visit.    Allergies  Allergen Reactions  . Codeine     nausea    . Cortisone     Swelling   . Guaifenesin Er     Doesn't work, made her sick    Patient Active Problem List   Diagnosis Date Noted  . Abnormal LFTs (liver function tests) 02/26/2013  . Postherpetic neuralgia 12/27/2012  . Benign hypertensive heart disease without heart failure 12/27/2012  . Colitis, ulcerative chronic 07/08/2011  . Rapid palpitations 12/24/2010  .  Hypercholesterolemia 12/24/2010  . ULCERATIVE PROCTITIS 10/14/2009  . MITRAL VALVE PROLAPSE, HX OF 10/14/2009    History  Smoking status  . Never Smoker   Smokeless tobacco  . Never Used    History  Alcohol Use No    Family History  Problem Relation Age of Onset  . Hypertension Mother   . Stroke Mother   . Heart disease Mother   . Rheum arthritis Father   . Melanoma Father   . Melanoma Brother   . Cancer Paternal Grandfather     lung    Review of Systems: Constitutional: no fever chills diaphoresis or fatigue or change in weight.  Head and neck: no hearing loss, no epistaxis, no photophobia or visual disturbance. Respiratory: No cough, shortness of breath or wheezing. Cardiovascular: No chest pain peripheral edema, palpitations. Gastrointestinal: No abdominal distention, no abdominal pain, no change in bowel habits hematochezia or melena. Genitourinary: No dysuria, no frequency, no urgency, no nocturia. Musculoskeletal:No arthralgias, no back pain, no gait disturbance or myalgias. Neurological: No dizziness, no headaches, no numbness, no seizures, no syncope, no weakness, no tremors. Hematologic: No lymphadenopathy, no easy bruising. Psychiatric: No confusion, no hallucinations, no sleep disturbance.    Physical Exam: Filed Vitals:   08/30/13 0928  BP: 138/78  Pulse:    the general appearance reveals a well-developed well-nourished woman  in no distress.The head and neck exam reveals pupils equal and reactive.  Extraocular movements are full.  There is no scleral icterus.  The mouth and pharynx are normal.  The neck is supple.  The carotids reveal no bruits.  The jugular venous pressure is normal.  The  thyroid is not enlarged.  There is no lymphadenopathy.  The chest is clear to percussion and auscultation.  There are no rales or rhonchi.  Expansion of the chest is symmetrical.  The precordium is quiet.  The first heart sound is normal.  The second heart sound is  physiologically split.  There is no murmur gallop rub.  At the apex there is a faint midsystolic click without murmur. There is no abnormal lift or heave.  The abdomen is soft and nontender.  The bowel sounds are normal.  The liver and spleen are not enlarged.  There are no abdominal masses.  There are no abdominal bruits.  Extremities reveal good pedal pulses.  There is no phlebitis or edema.  There is no cyanosis or clubbing.  Strength is normal and symmetrical in all extremities.  There is no lateralizing weakness.  There are no sensory deficits. The patient does experience hyperesthesia of the left arm secondary to previous herpes zoster.  The skin is warm and dry.       Assessment / Plan: Continue same medication.  Blood work today pending.  Recheck in 6 months for office visit fasting lipid panel hepatic function panel and basal metabolic panel.

## 2013-08-30 NOTE — Patient Instructions (Signed)
Will obtain labs today and call you with the results ([email or web address])  Your physician recommends that you continue on your current medications as directed. Please refer to the Current Medication list given to you today.  Your physician wants you to follow-up in: 6 months with fasting labs (lp/bmet/hfp) You will receive a reminder letter in the mail two months in advance. If you don't receive a letter, please call our office to schedule the follow-up appointment.

## 2013-08-31 ENCOUNTER — Telehealth: Payer: Self-pay | Admitting: *Deleted

## 2013-08-31 NOTE — Telephone Encounter (Signed)
Message copied by Earvin Hansen on Fri Aug 31, 2013  4:45 PM ------      Message from: Darlin Coco      Created: Thu Aug 30, 2013  8:54 PM       Please report to patient.  The recent labs are stable. Continue same medication and careful diet. LFTs are normal.  Kidneys dry.  Drink more water.             ------

## 2013-08-31 NOTE — Telephone Encounter (Signed)
Advised patient of lab results  

## 2013-09-05 ENCOUNTER — Ambulatory Visit (INDEPENDENT_AMBULATORY_CARE_PROVIDER_SITE_OTHER): Payer: Medicare Other | Admitting: Ophthalmology

## 2013-09-18 ENCOUNTER — Other Ambulatory Visit: Payer: Self-pay | Admitting: Cardiology

## 2013-09-24 ENCOUNTER — Ambulatory Visit (INDEPENDENT_AMBULATORY_CARE_PROVIDER_SITE_OTHER): Payer: Medicare Other | Admitting: Ophthalmology

## 2013-09-24 DIAGNOSIS — H43819 Vitreous degeneration, unspecified eye: Secondary | ICD-10-CM

## 2013-09-24 DIAGNOSIS — H3530 Unspecified macular degeneration: Secondary | ICD-10-CM | POA: Diagnosis not present

## 2013-09-24 DIAGNOSIS — H35039 Hypertensive retinopathy, unspecified eye: Secondary | ICD-10-CM | POA: Diagnosis not present

## 2013-09-24 DIAGNOSIS — I10 Essential (primary) hypertension: Secondary | ICD-10-CM

## 2013-09-24 DIAGNOSIS — H2510 Age-related nuclear cataract, unspecified eye: Secondary | ICD-10-CM

## 2013-11-17 ENCOUNTER — Other Ambulatory Visit: Payer: Self-pay | Admitting: Family Medicine

## 2013-11-19 ENCOUNTER — Other Ambulatory Visit: Payer: Self-pay | Admitting: Cardiology

## 2013-11-19 ENCOUNTER — Other Ambulatory Visit: Payer: Self-pay | Admitting: Family Medicine

## 2014-01-15 ENCOUNTER — Other Ambulatory Visit: Payer: Self-pay | Admitting: Gastroenterology

## 2014-01-15 NOTE — Telephone Encounter (Signed)
Dr. Hilarie Fredrickson,   Patient is to follow up with you 06-2014. Patient is requesting Sulfasalazine. Is it okay to refill?

## 2014-01-15 NOTE — Telephone Encounter (Signed)
Ok to refill until follow-up

## 2014-02-06 ENCOUNTER — Other Ambulatory Visit: Payer: Self-pay | Admitting: Family Medicine

## 2014-02-18 ENCOUNTER — Encounter: Payer: Self-pay | Admitting: Gastroenterology

## 2014-02-23 ENCOUNTER — Other Ambulatory Visit: Payer: Self-pay | Admitting: Cardiology

## 2014-02-23 NOTE — Telephone Encounter (Signed)
Rx was sent to pharmacy electronically. 

## 2014-03-07 ENCOUNTER — Ambulatory Visit (INDEPENDENT_AMBULATORY_CARE_PROVIDER_SITE_OTHER): Payer: Medicare Other | Admitting: Cardiology

## 2014-03-07 ENCOUNTER — Other Ambulatory Visit (INDEPENDENT_AMBULATORY_CARE_PROVIDER_SITE_OTHER): Payer: Medicare Other | Admitting: *Deleted

## 2014-03-07 ENCOUNTER — Encounter: Payer: Self-pay | Admitting: Cardiology

## 2014-03-07 VITALS — BP 124/76 | HR 66 | Ht 61.0 in | Wt 141.0 lb

## 2014-03-07 DIAGNOSIS — E7800 Pure hypercholesterolemia, unspecified: Secondary | ICD-10-CM

## 2014-03-07 DIAGNOSIS — I119 Hypertensive heart disease without heart failure: Secondary | ICD-10-CM | POA: Diagnosis not present

## 2014-03-07 DIAGNOSIS — B0229 Other postherpetic nervous system involvement: Secondary | ICD-10-CM | POA: Diagnosis not present

## 2014-03-07 LAB — BASIC METABOLIC PANEL
BUN: 19 mg/dL (ref 6–23)
CHLORIDE: 104 meq/L (ref 96–112)
CO2: 29 mEq/L (ref 19–32)
Calcium: 9.5 mg/dL (ref 8.4–10.5)
Creatinine, Ser: 1.3 mg/dL — ABNORMAL HIGH (ref 0.4–1.2)
GFR: 43.65 mL/min — ABNORMAL LOW (ref >60.00)
Glucose, Bld: 88 mg/dL (ref 70–99)
POTASSIUM: 4.6 meq/L (ref 3.5–5.1)
Sodium: 140 mEq/L (ref 135–145)

## 2014-03-07 LAB — LIPID PANEL
Cholesterol: 220 mg/dL — ABNORMAL HIGH (ref 0–200)
HDL: 51.2 mg/dL (ref >39.00)
LDL CALC: 140 mg/dL — AB (ref 0–99)
NonHDL: 168.8
TRIGLYCERIDES: 142 mg/dL (ref 0.0–149.0)
Total CHOL/HDL Ratio: 4
VLDL: 28.4 mg/dL (ref 0.0–40.0)

## 2014-03-07 LAB — HEPATIC FUNCTION PANEL
ALBUMIN: 3.6 g/dL (ref 3.5–5.2)
ALT: 18 U/L (ref 0–35)
AST: 23 U/L (ref 0–37)
Alkaline Phosphatase: 80 U/L (ref 39–117)
BILIRUBIN DIRECT: 0 mg/dL (ref 0.0–0.3)
Total Bilirubin: 0.8 mg/dL (ref 0.2–1.2)
Total Protein: 6.6 g/dL (ref 6.0–8.3)

## 2014-03-07 NOTE — Assessment & Plan Note (Signed)
The symptoms are gradually improving with time

## 2014-03-07 NOTE — Assessment & Plan Note (Signed)
Her symptoms of colitis have been stable on current therapy.  She has to eat a lot of vegetables and greens each day.

## 2014-03-07 NOTE — Progress Notes (Signed)
Diana Brewer Date of Birth:  08/01/1933 [address] [address] [place], [place]  [address] [phone]        Fax   [phone]   History of Present Illness: This pleasant 80 year old woman is seen for a followup office visit. 18 months ago she developed a typical severe left chest rash consistent with herpes zoster. She was treated promptly with Famvir. She still has some mild postherpetic neuralgia which manifests itself as a swollen feeling along the left lateral side of her chest. She has a history of palpitations and a history of mitral valve prolapse. She has a history of hypercholesterolemia. She also has a history of chronic inflammatory proctitis.  She has a new gastroenterologist since [name] retired but she does not recall his name. Since last visit she has been doing reasonably well otherwise.    Current Outpatient Prescriptions  Medication Sig Dispense Refill  . hydrochlorothiazide (MICROZIDE) 12.5 MG capsule TAKE ONE CAPSULE BY MOUTH EVERY OTHER DAY  15 capsule  1  . Multiple Vitamins-Minerals (CENTRUM SILVER) tablet Take 1 tablet by mouth daily.       . polyethylene glycol (MIRALAX / GLYCOLAX) packet Take 17 g by mouth daily.        . propranolol (INDERAL) 20 MG tablet TAKE ONE TABLET BY MOUTH TWICE DAILY  180 tablet  1  . sulfaSALAzine (AZULFIDINE) 500 MG tablet TAKE TWO TABLETS BY MOUTH ONCE DAILY  60 tablet  6  . Wheat Dextrin (BENEFIBER PO) Take by mouth as needed.         No current facility-administered medications for this visit.    Allergies  Allergen Reactions  . Codeine     nausea    . Cortisone     Swelling   . Guaifenesin Er     Doesn't work, made her sick    Patient Active Problem List   Diagnosis Date Noted  . Abnormal LFTs (liver function tests) 02/26/2013  . Postherpetic neuralgia 12/27/2012  . Benign hypertensive heart disease without heart failure 12/27/2012  . Colitis, ulcerative chronic 07/08/2011    . Rapid palpitations 12/24/2010  . Hypercholesterolemia 12/24/2010  . ULCERATIVE PROCTITIS 10/14/2009  . MITRAL VALVE PROLAPSE, HX OF 10/14/2009    History  Smoking status  . Never Smoker   Smokeless tobacco  . Never Used    History  Alcohol Use No    Family History  Problem Relation Age of Onset  . Hypertension Mother   . Stroke Mother   . Heart disease Mother   . Rheum arthritis Father   . Melanoma Father   . Melanoma Brother   . Cancer Paternal Grandfather     lung    Review of Systems: Constitutional: no fever chills diaphoresis or fatigue or change in weight.  Head and neck: no hearing loss, no epistaxis, no photophobia or visual disturbance. Respiratory: No cough, shortness of breath or wheezing. Cardiovascular: No chest pain peripheral edema, palpitations. Gastrointestinal: No abdominal distention, no abdominal pain, no change in bowel habits hematochezia or melena. Genitourinary: No dysuria, no frequency, no urgency, no nocturia. Musculoskeletal:No arthralgias, no back pain, no gait disturbance or myalgias. Neurological: No dizziness, no headaches, no numbness, no seizures, no syncope, no weakness, no tremors. Hematologic: No lymphadenopathy, no easy bruising. Psychiatric: No confusion, no hallucinations, no sleep disturbance.    Physical Exam: Filed Vitals:   03/07/14 0909  BP: 124/76  Pulse: 66   her weight is 141, up  5 pounds. The patient appears to be in no distress.  Head and neck exam reveals that the pupils are equal and reactive.  The extraocular movements are full.  There is no scleral icterus.  Mouth and pharynx are benign.  No lymphadenopathy.  No carotid bruits.  The jugular venous pressure is normal.  Thyroid is not enlarged or tender.  Chest is clear to percussion and auscultation.  No rales or rhonchi.  Expansion of the chest is symmetrical.  Heart reveals no abnormal lift or heave.  First and second heart sounds are normal.  There is no  murmur gallop or rub.  There is a soft apical midsystolic click  The abdomen is soft and nontender.  Bowel sounds are normoactive.  There is no hepatosplenomegaly or mass.  There are no abdominal bruits.  Extremities reveal no phlebitis or edema.  Pedal pulses are good.  There is no cyanosis or clubbing.  Neurologic exam is normal strength and no lateralizing weakness.  No sensory deficits.  Integument reveals no rash    Assessment / Plan: 1. essential hypertension without heart failure 2. mitral valve prolapse 3. Dyslipidemia 4. ulcerative colitis and ulcerative proctitis followed by [facility] 5.  Postherpetic neuralgia of left chest  Disposition: Continue same medication.  Recheck in 6 months for office visit lipid panel hepatic function panel and basal metabolic panel

## 2014-03-07 NOTE — Assessment & Plan Note (Signed)
The patient has not been having and he recent exertional chest pain.  No symptoms of CHF.  No palpitations.

## 2014-03-07 NOTE — Patient Instructions (Signed)
Will obtain labs today and call you with the results (LP/BMET/HFP)  Your physician recommends that you continue on your current medications as directed. Please refer to the Current Medication list given to you today.  Your physician wants you to follow-up in: 6 months with fasting labs (lp/bmet/hfp)  You will receive a reminder letter in the mail two months in advance. If you don't receive a letter, please call our office to schedule the follow-up appointment.

## 2014-03-07 NOTE — Assessment & Plan Note (Signed)
The patient has not been experiencing any tachycardia or exertional chest pain.  She stays physically active.

## 2014-05-10 DIAGNOSIS — M1990 Unspecified osteoarthritis, unspecified site: Secondary | ICD-10-CM

## 2014-05-10 HISTORY — DX: Unspecified osteoarthritis, unspecified site: M19.90

## 2014-08-29 ENCOUNTER — Other Ambulatory Visit: Payer: Self-pay | Admitting: Gastroenterology

## 2014-09-25 ENCOUNTER — Ambulatory Visit (INDEPENDENT_AMBULATORY_CARE_PROVIDER_SITE_OTHER): Payer: Medicare Other | Admitting: Ophthalmology

## 2014-10-08 DIAGNOSIS — L821 Other seborrheic keratosis: Secondary | ICD-10-CM | POA: Diagnosis not present

## 2014-10-08 DIAGNOSIS — L814 Other melanin hyperpigmentation: Secondary | ICD-10-CM | POA: Diagnosis not present

## 2014-10-23 DIAGNOSIS — M25531 Pain in right wrist: Secondary | ICD-10-CM | POA: Diagnosis not present

## 2014-10-30 ENCOUNTER — Other Ambulatory Visit: Payer: Self-pay | Admitting: Cardiology

## 2014-10-30 ENCOUNTER — Other Ambulatory Visit: Payer: Self-pay | Admitting: Gastroenterology

## 2014-11-07 ENCOUNTER — Telehealth: Payer: Self-pay

## 2014-11-07 NOTE — Telephone Encounter (Signed)
She should get that drug from her gastroenterologist or her PCP.

## 2014-11-20 DIAGNOSIS — M19031 Primary osteoarthritis, right wrist: Secondary | ICD-10-CM | POA: Diagnosis not present

## 2014-12-04 ENCOUNTER — Other Ambulatory Visit: Payer: Self-pay | Admitting: Gastroenterology

## 2014-12-12 ENCOUNTER — Encounter: Payer: Self-pay | Admitting: Family Medicine

## 2015-01-03 ENCOUNTER — Encounter: Payer: Self-pay | Admitting: Cardiology

## 2015-01-03 ENCOUNTER — Ambulatory Visit (INDEPENDENT_AMBULATORY_CARE_PROVIDER_SITE_OTHER): Payer: Medicare Other | Admitting: Cardiology

## 2015-01-03 VITALS — BP 120/70 | HR 65 | Ht 61.0 in | Wt 147.8 lb

## 2015-01-03 DIAGNOSIS — I119 Hypertensive heart disease without heart failure: Secondary | ICD-10-CM

## 2015-01-03 DIAGNOSIS — R002 Palpitations: Secondary | ICD-10-CM

## 2015-01-03 DIAGNOSIS — E7800 Pure hypercholesterolemia, unspecified: Secondary | ICD-10-CM

## 2015-01-03 LAB — BASIC METABOLIC PANEL
BUN: 24 mg/dL — ABNORMAL HIGH (ref 6–23)
CALCIUM: 9.5 mg/dL (ref 8.4–10.5)
CO2: 31 meq/L (ref 19–32)
CREATININE: 1.32 mg/dL — AB (ref 0.40–1.20)
Chloride: 104 mEq/L (ref 96–112)
GFR: 41.28 mL/min — AB (ref >60.00)
GLUCOSE: 91 mg/dL (ref 70–99)
Potassium: 4.6 mEq/L (ref 3.5–5.1)
SODIUM: 141 meq/L (ref 135–145)

## 2015-01-03 LAB — LIPID PANEL
CHOLESTEROL: 199 mg/dL (ref 0–200)
HDL: 48.8 mg/dL (ref >39.00)
LDL Cholesterol: 128 mg/dL — ABNORMAL HIGH (ref 0–99)
NONHDL: 150.61
Total CHOL/HDL Ratio: 4
Triglycerides: 111 mg/dL (ref 0.0–149.0)
VLDL: 22.2 mg/dL (ref 0.0–40.0)

## 2015-01-03 LAB — HEPATIC FUNCTION PANEL
ALK PHOS: 83 U/L (ref 39–117)
ALT: 16 U/L (ref 0–35)
AST: 22 U/L (ref 0–37)
Albumin: 4 g/dL (ref 3.5–5.2)
BILIRUBIN TOTAL: 0.4 mg/dL (ref 0.2–1.2)
Bilirubin, Direct: 0 mg/dL (ref 0.0–0.3)
Total Protein: 6.6 g/dL (ref 6.0–8.3)

## 2015-01-03 MED ORDER — PROPRANOLOL HCL 20 MG PO TABS: 20.0000 mg | ORAL_TABLET | Freq: Three times a day (TID) | ORAL | Status: DC | PRN

## 2015-01-03 MED ORDER — PROPRANOLOL HCL 20 MG PO TABS
20.0000 mg | ORAL_TABLET | Freq: Three times a day (TID) | ORAL | Status: DC | PRN
Start: 2015-01-03 — End: 2015-06-26

## 2015-01-03 NOTE — Progress Notes (Signed)
Cardiology Office Note   Date:  01/03/2015   ID:  Diana Brewer, DOB 12-14-1933, MRN [id number]  PCP:  [name], MD  Cardiologist: Darlin Coco MD  No chief complaint on file.     History of Present Illness: Diana Brewer is a 81 y.o. female who presents for a six-month office visit This pleasant 81 year old woman is seen for a followup office visit. 24 months ago she developed a typical severe left chest rash consistent with herpes zoster. She was treated promptly with Famvir. She still has some mild postherpetic neuralgia which manifests itself as a swollen feeling along the left lateral side of her chest. She has a history of palpitations and a history of mitral valve prolapse. She has a history of hypercholesterolemia. She also has a history of chronic inflammatory proctitis. She has a new gastroenterologist since [name] retired but she does not recall his name. Since last visit she has been doing reasonably well otherwise.  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  She stays physically active.  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   Past Medical History  Diagnosis Date  . MVP (mitral valve prolapse)   . Palpitations   . Hypertension   . HLD (hyperlipidemia)   . Ulcerative colitis     left sided - proctosigmoiditis ([name])  . Macular degeneration     Diana Brewer  . History of shingles   . History of migraine remote  . Arthritis 2016    R wrist predominantly radius scaphoid and midcarpal [name]    Past Surgical History  Procedure Laterality Date  . Breast biopsy  1975  . Tracheostomy  1942    due to Strep Throat (?epiglottitis)  . [facility]oscopy  2011    [facility]     Current Outpatient Prescriptions  Medication Sig Dispense Refill  . hydrochlorothiazide (MICROZIDE) 12.5 MG capsule TAKE ONE CAPSULE BY MOUTH EVERY OTHER DAY 15 capsule 1  . Multiple  Vitamins-Minerals (CENTRUM SILVER) tablet Take 1 tablet by mouth daily.     . polyethylene glycol (MIRALAX / GLYCOLAX) packet Take 17 g by mouth daily.      . propranolol (INDERAL) 20 MG tablet Take 1 tablet (20 mg total) by mouth 3 (three) times daily as needed. 270 tablet 3  . sulfaSALAzine (AZULFIDINE) 500 MG tablet TAKE 2 TABLETS BY MOUTH ONCE DAILY 60 tablet 0  . Wheat Dextrin (BENEFIBER PO) Take by mouth as needed.       No current facility-administered medications for this visit.    Allergies:   Codeine; Cortisone; and Guaifenesin er    Social History:  The patient  reports that she has never smoked. She has never used smokeless tobacco. She reports that she does not drink alcohol or use illicit drugs.   Family History:  The patient's family history includes Cancer in her paternal grandfather; Heart disease in her mother; Hypertension in her mother; Melanoma in her brother and father; Rheum arthritis in her father; Stroke in her mother.    ROS:  Please see the history of present illness.   Otherwise, review of systems are positive for none.   All other systems are reviewed and negative.    PHYSICAL EXAM: VS:  BP 120/70 mmHg  Pulse 65  Ht 5' 1"  (1.549 m)  Wt 147 lb 12.8 oz (67.042 kg)  BMI 27.94 kg/m2  SpO2 98% , BMI Body  mass index is 27.94 kg/(m^2). GEN: Well nourished, well developed, in no acute distress HEENT: normal Neck: no JVD, carotid bruits, or masses Cardiac: RRR; no murmurs, rubs, or gallops,no edema  Respiratory:  clear to auscultation bilaterally, normal work of breathing GI: soft, nontender, nondistended, + BS MS: no deformity or atrophy Skin: warm and dry, no rash Neuro:  Strength and sensation are intact Psych: euthymic mood, full affect   EKG:  EKG is ordered today. The ekg ordered today demonstrates normal sinus rhythm.  Within normal limits.   Recent Labs: 03/07/2014: ALT 18; BUN 19; Creatinine, Ser 1.3*; Potassium 4.6; Sodium 140    Lipid  Panel    Component Value Date/Time   CHOL 220* 03/07/2014 0839   TRIG 142.0 03/07/2014 0839   HDL 51.20 03/07/2014 0839   CHOLHDL 4 03/07/2014 0839   VLDL 28.4 03/07/2014 0839   LDLCALC 140* 03/07/2014 0839   LDLDIRECT 132.6 12/27/2012 1146      Wt Readings from Last 3 Encounters:  01/03/15 147 lb 12.8 oz (67.042 kg)  03/07/14 141 lb (63.957 kg)  08/30/13 136 lb (61.689 kg)         ASSESSMENT AND PLAN: 1. essential hypertension without heart failure 2. mitral valve prolapse 3. Dyslipidemia 4. ulcerative colitis and ulcerative proctitis followed by [facility].  Dr. Hilarie Fredrickson has taken over for Dr. Verl Blalock 5. Postherpetic neuralgia of left chest  Disposition: Continue same medication. Recheck in 6 months for office visit lipid panel hepatic function panel and basal metabolic panel    Current medicines are reviewed at length with the patient today.  The patient does not have concerns regarding medicines.  The following changes have been made:  no change  Labs/ tests ordered today include:   Orders Placed This Encounter  Procedures  . Lipid panel  . Hepatic function panel  . Basic metabolic panel  . EKG 12-Lead     Disposition: She occasionally has to take Her propranolol for her palpitations.  Generally she gets by with just twice a day.  We will change the propranolol instructions to every 8 hours when necessary to allow for intermittent higher dosage.  We are checking lab work today fasting.  Recheck in 6 months for office visit and fasting lab work.  Continue other medicines as same. She is discouraged about her weight gain.  She will continue to try to limit calories and particularly carbs  Signed, Darlin Coco MD 01/03/2015 10:39 AM    [address], [place], [place]  [address] Phone: [phone]; Fax: [phone]

## 2015-01-03 NOTE — Progress Notes (Signed)
Quick Note:  Please report to patient. The recent labs are stable. Continue same medication and careful diet. ______ 

## 2015-01-03 NOTE — Patient Instructions (Signed)
Medication Instructions:  CHANGE THE PROPANOLOL TO 20 MG EVERY 8 HOURS AS NEEDED   Labwork: LP/BMET/HFP  Testing/Procedures: NON  Follow-Up: Your physician wants you to follow-up in: 6 months with fasting labs (lp/bmet/hfp)  You will receive a reminder letter in the mail two months in advance. If you don't receive a letter, please call our office to schedule the follow-up appointment.

## 2015-01-07 ENCOUNTER — Telehealth: Payer: Self-pay | Admitting: Cardiology

## 2015-01-07 NOTE — Telephone Encounter (Signed)
Advised patient of lab results  

## 2015-01-07 NOTE — Telephone Encounter (Signed)
-----   Message from Darlin Coco, MD sent at 01/03/2015  9:07 PM EDT ----- Please report to patient.  The recent labs are stable. Continue same medication and careful diet.

## 2015-01-07 NOTE — Telephone Encounter (Signed)
New message      Returning a call to [facility]

## 2015-01-17 ENCOUNTER — Other Ambulatory Visit: Payer: Self-pay | Admitting: Family Medicine

## 2015-01-28 ENCOUNTER — Other Ambulatory Visit: Payer: Self-pay | Admitting: Cardiology

## 2015-02-26 ENCOUNTER — Other Ambulatory Visit: Payer: Self-pay

## 2015-02-26 ENCOUNTER — Other Ambulatory Visit: Payer: Self-pay | Admitting: Family Medicine

## 2015-02-26 NOTE — Telephone Encounter (Signed)
Last filled 10/30/2014--please advise if okay to refill

## 2015-02-26 NOTE — Telephone Encounter (Signed)
This should go through GI.

## 2015-03-05 ENCOUNTER — Other Ambulatory Visit: Payer: Self-pay | Admitting: Internal Medicine

## 2015-03-05 NOTE — Telephone Encounter (Signed)
Ok to refill, will need routine ROV

## 2015-03-05 NOTE — Telephone Encounter (Signed)
Patient requests refills of sulfasalazine. She was last seen by Dr Sharlett Iles 06/21/13. She last got sulfasalazine on 10/30/14 #60. Do you want me to go ahead and refill or does she need office visit first.

## 2015-03-25 DIAGNOSIS — L918 Other hypertrophic disorders of the skin: Secondary | ICD-10-CM | POA: Diagnosis not present

## 2015-03-25 DIAGNOSIS — B078 Other viral warts: Secondary | ICD-10-CM | POA: Diagnosis not present

## 2015-04-08 ENCOUNTER — Other Ambulatory Visit: Payer: Self-pay | Admitting: Cardiology

## 2015-05-07 ENCOUNTER — Ambulatory Visit (INDEPENDENT_AMBULATORY_CARE_PROVIDER_SITE_OTHER): Payer: Medicare Other | Admitting: Internal Medicine

## 2015-05-07 ENCOUNTER — Encounter: Payer: Self-pay | Admitting: Internal Medicine

## 2015-05-07 VITALS — BP 150/80 | HR 68 | Ht 60.75 in | Wt 150.2 lb

## 2015-05-07 DIAGNOSIS — K5130 Ulcerative (chronic) rectosigmoiditis without complications: Secondary | ICD-10-CM

## 2015-05-07 DIAGNOSIS — R635 Abnormal weight gain: Secondary | ICD-10-CM | POA: Diagnosis not present

## 2015-05-07 DIAGNOSIS — K5909 Other constipation: Secondary | ICD-10-CM

## 2015-05-07 DIAGNOSIS — R12 Heartburn: Secondary | ICD-10-CM | POA: Diagnosis not present

## 2015-05-07 DIAGNOSIS — K5900 Constipation, unspecified: Secondary | ICD-10-CM

## 2015-05-07 MED ORDER — SULFASALAZINE 500 MG PO TABS: 1000.0000 mg | ORAL_TABLET | Freq: Every day | ORAL | Status: DC

## 2015-05-07 NOTE — Progress Notes (Signed)
Patient ID: Diana Brewer, female   DOB: 09-25-1933, 81 y.o.   MRN: [id number] HPI: Diana Brewer is a 81 year old female with history of very distal proctosigmoiditis, hyperlipidemia, mitral valve prolapse, hypertension who is seen in follow-up. She was previously managed by [name] for many many years. She is been maintained on sulfasalazine 1000 mg daily. She reports that she has been doing very well. Her colitis is asymptomatic. She was diagnosed many many years ago approximately around age 51. She has never had colon polyps. She's had no recent rectal bleeding, abdominal pain or tenesmus. Stools have been formed and actually hard if she does not use MiraLAX. She's been using MiraLAX 8.5 g daily which works very well for her. She rarely uses Benefiber. Appetite has been good. No nausea or vomiting, dysphagia or odynophagia. She has had some mild heartburn on occasion. No hepatobiliary complaint. She has gained 10-15 pounds in the past year but she states "I'm not going to starve myself to lose weight". She remains active with walking and walks in the mall during the winter. She lives with her husband in [place]. She has one daughter and one granddaughter.  Her last colonoscopy was in 2011 performed by [name]. This showed mild mucosal changes from 0-10 cm which was biopsied. The colon was otherwise normal except for mild diverticulosis in the sigmoid. Biopsies showed minimally active chronic colitis.  Past Medical History  Diagnosis Date  . MVP (mitral valve prolapse)   . Palpitations   . Hypertension   . HLD (hyperlipidemia)   . Ulcerative colitis ([facility])     left sided - proctosigmoiditis ([name])  . Macular degeneration     [name]  . History of shingles   . History of migraine remote  . Arthritis 2016    R wrist predominantly radius scaphoid and midcarpal [name]    Past Surgical History  Procedure Laterality Date  . Breast biopsy  1975  .  Tracheostomy  1942    due to Strep Throat (?epiglottitis)  . [facility]oscopy  2011    [facility]    Outpatient Prescriptions Prior to Visit  Medication Sig Dispense Refill  . hydrochlorothiazide (MICROZIDE) 12.5 MG capsule TAKE 1 CAPSULE BY MOUTH EVERY OTHER DAY 15 capsule 8  . Multiple Vitamins-Minerals (CENTRUM SILVER) tablet Take 1 tablet by mouth daily.     . polyethylene glycol (MIRALAX / GLYCOLAX) packet Take 17 g by mouth daily.      . propranolol (INDERAL) 20 MG tablet Take 1 tablet (20 mg total) by mouth 3 (three) times daily as needed. 270 tablet 3  . Wheat Dextrin (BENEFIBER PO) Take by mouth as needed.      . sulfaSALAzine (AZULFIDINE) 500 MG tablet TAKE 2 TABLETS BY MOUTH ONCE DAILY 180 tablet 0   No facility-administered medications prior to visit.    Allergies  Allergen Reactions  . Codeine     nausea    . Cortisone     Swelling   . Guaifenesin Er     Doesn't work, made her sick    Family History  Problem Relation Age of Onset  . Hypertension Mother   . Stroke Mother   . Heart disease Mother   . Rheum arthritis Father   . Melanoma Father   . Melanoma Brother   . Cancer Paternal Grandfather     lung    Social History  Substance Use Topics  . Smoking status: Never Smoker   . Smokeless tobacco: Never  Used  . Alcohol Use: No    ROS: As per history of present illness, otherwise negative  BP 150/80 mmHg  Pulse 68  Ht 5' 0.75" (1.543 m)  Wt 150 lb 4 oz (68.153 kg)  BMI 28.63 kg/m2 Constitutional: Well-developed and well-nourished. No distress. HEENT: Normocephalic and atraumatic. Oropharynx is clear and moist. No oropharyngeal exudate. Conjunctivae are normal.  No scleral icterus. Neck: Neck supple. Trachea midline. Cardiovascular: Normal rate, regular rhythm and intact distal pulses. No M/R/G Pulmonary/chest: Effort normal and breath sounds normal. No wheezing, rales or rhonchi. Abdominal: Soft, nontender, nondistended. Bowel sounds active throughout. There  are no masses palpable. No hepatosplenomegaly. Extremities: no clubbing, cyanosis, or edema Lymphadenopathy: No cervical adenopathy noted. Neurological: Alert and oriented to person place and time. Skin: Skin is warm and dry. No rashes noted. Psychiatric: Normal mood and affect. Behavior is normal.  RELEVANT LABS AND IMAGING: CBC    Component Value Date/Time   WBC 6.3 08/13/2012 1144   WBC 7.1 06/30/2011 1027   RBC 4.06 08/13/2012 1144   RBC 3.90 06/30/2011 1027   HGB 12.3 08/13/2012 1144   HGB 12.7 06/30/2011 1027   HCT 40.2 08/13/2012 1144   HCT 38.1 06/30/2011 1027   PLT 302.0 06/30/2011 1027   MCV 99.0* 08/13/2012 1144   MCV 97.8 06/30/2011 1027   MCH 30.3 08/13/2012 1144   MCHC 30.6* 08/13/2012 1144   MCHC 33.3 06/30/2011 1027   RDW 14.1 06/30/2011 1027   LYMPHSABS 1.6 06/30/2011 1027   MONOABS 0.6 06/30/2011 1027   EOSABS 0.2 06/30/2011 1027   BASOSABS 0.1 06/30/2011 1027    CMP     Component Value Date/Time   NA 141 01/03/2015 1003   K 4.6 01/03/2015 1003   CL 104 01/03/2015 1003   CO2 31 01/03/2015 1003   GLUCOSE 91 01/03/2015 1003   BUN 24* 01/03/2015 1003   CREATININE 1.32* 01/03/2015 1003   CALCIUM 9.5 01/03/2015 1003   PROT 6.6 01/03/2015 1003   ALBUMIN 4.0 01/03/2015 1003   AST 22 01/03/2015 1003   ALT 16 01/03/2015 1003   ALKPHOS 83 01/03/2015 1003   BILITOT 0.4 01/03/2015 1003    ASSESSMENT/PLAN:  81 year old female with history of very distal proctosigmoiditis, hyperlipidemia, mitral valve prolapse, hypertension who is seen in follow-up.   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 Chronic and in remission clinically. She is doing well on sulfasalazine. We'll continue sulfasalazine therapy 1000 mg daily. We discussed surveillance colonoscopy and the risk of colon cancer given her very limited disease is not felt significant. She does not have history of colon polyps or dysplasia on biopsy. We  discussed surveillance endoscopy and given her age she would like to avoid this if possible. I think this is reasonable. At this point we would plan surveillance or diagnostic endoscopy only if symptoms warrant. She is very happy with this plan. --Stable blood count and no evidence of hepatitis  2. Mild chronic constipation -- excellent response to half dose MiraLAX. She will continue half dose MiraLAX daily  3. Weight gain -- I recommended checking TSH to exclude hypothyroidism. She would prefer to have labs drawn per her usual with Dr. Mare Ferrari.  TSH can be checked when she sees him in February as scheduled  4. Intermittent heartburn -- no alarm symptoms. Likely related to gain weight. Zantac over-the-counter per box instruction recommended on an as-needed basis. Notify me if heartburn occurring more frequently are not relieved by Zantac or  symptoms change.  One year follow-up, sooner if needed 25 minutes spent with the patient today. Greater than 50% was spent in counseling and coordination of care with the patient      RV:U[address], [place], [place] [address]

## 2015-05-07 NOTE — Patient Instructions (Addendum)
We have sent the following medications to your pharmacy for you to pick up at your convenience: Sulfasalazine 1000 mg daily  Take 1/2 dose miralax (9 grams) daily.  Please follow up with Dr Hilarie Fredrickson in 1 year.  Please ask Dr Mare Ferrari to order a TSH (thyroid stimulating hormone) at your visit with him in February.  CC:Dr Brackbill

## 2015-06-03 ENCOUNTER — Other Ambulatory Visit: Payer: Self-pay | Admitting: Internal Medicine

## 2015-06-26 ENCOUNTER — Ambulatory Visit (INDEPENDENT_AMBULATORY_CARE_PROVIDER_SITE_OTHER): Payer: Medicare Other | Admitting: Cardiology

## 2015-06-26 ENCOUNTER — Encounter: Payer: Self-pay | Admitting: Cardiology

## 2015-06-26 VITALS — BP 110/76 | HR 66 | Ht 61.0 in | Wt 146.8 lb

## 2015-06-26 DIAGNOSIS — R635 Abnormal weight gain: Secondary | ICD-10-CM | POA: Diagnosis not present

## 2015-06-26 DIAGNOSIS — E7800 Pure hypercholesterolemia, unspecified: Secondary | ICD-10-CM

## 2015-06-26 DIAGNOSIS — R002 Palpitations: Secondary | ICD-10-CM

## 2015-06-26 DIAGNOSIS — I119 Hypertensive heart disease without heart failure: Secondary | ICD-10-CM | POA: Diagnosis not present

## 2015-06-26 DIAGNOSIS — B0229 Other postherpetic nervous system involvement: Secondary | ICD-10-CM | POA: Diagnosis not present

## 2015-06-26 LAB — BASIC METABOLIC PANEL
BUN: 21 mg/dL (ref 7–25)
CHLORIDE: 105 mmol/L (ref 98–110)
CO2: 30 mmol/L (ref 20–31)
Calcium: 9.5 mg/dL (ref 8.6–10.4)
Creat: 1.31 mg/dL — ABNORMAL HIGH (ref 0.60–0.93)
GLUCOSE: 86 mg/dL (ref 65–99)
POTASSIUM: 4.4 mmol/L (ref 3.5–5.3)
Sodium: 140 mmol/L (ref 135–146)

## 2015-06-26 LAB — LIPID PANEL
CHOLESTEROL: 215 mg/dL — AB (ref 125–200)
HDL: 52 mg/dL (ref >=46)
LDL CALC: 138 mg/dL — AB (ref <130)
Total CHOL/HDL Ratio: 4.1 Ratio (ref <=5.0)
Triglycerides: 126 mg/dL (ref <150)
VLDL: 25 mg/dL (ref <30)

## 2015-06-26 LAB — HEPATIC FUNCTION PANEL
ALT: 17 U/L (ref 6–29)
AST: 22 U/L (ref 10–35)
Albumin: 4.2 g/dL (ref 3.6–5.1)
Alkaline Phosphatase: 78 U/L (ref 33–130)
BILIRUBIN DIRECT: 0.1 mg/dL (ref <=0.2)
BILIRUBIN INDIRECT: 0.5 mg/dL (ref 0.2–1.2)
BILIRUBIN TOTAL: 0.6 mg/dL (ref 0.2–1.2)
Total Protein: 6.4 g/dL (ref 6.1–8.1)

## 2015-06-26 LAB — TSH: TSH: 4.25 mIU/L

## 2015-06-26 NOTE — Progress Notes (Signed)
Cardiology Office Note   Date:  06/26/2015   ID:  Diana Brewer, DOB July 02, 1933, MRN [id number]  PCP:  [name], MD  Cardiologist: Darlin Coco MD  Chief Complaint  Patient presents with  . routine follow up    benign hypertensive disease without heart failure      History of Present Illness: Diana Brewer is a 82 y.o. female who presents for scheduled 6 month follow-up visit  . She has a history of palpitations and a history of mitral valve prolapse. She has a history of hypercholesterolemia. She also has a history of chronic inflammatory proctitis. Dr. Hilarie Fredrickson is her gastroenterologist. Since last visit she has been doing reasonably well otherwise.  She had herpes zoster several years ago and still has a lot of postherpetic neuralgia on the left side of her chest. 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 She stays physically active. 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  They are starting to get ready for the spring growing season. From a cardiac standpoint the patient has been stable.  She has not been having any chest pain or shortness of breath.  She tries to exercise by walking at the mall 30 minutes several times a week. She is concerned about inability to lose weight.  Her weight has gradually been going up.  We will check a TSH today.  Past Medical History  Diagnosis Date  . MVP (mitral valve prolapse)   . Palpitations   . Hypertension   . HLD (hyperlipidemia)   . Ulcerative colitis ([place])     left sided - proctosigmoiditis ([name])  . Macular degeneration     Diana Brewer  . History of shingles   . History of migraine remote  . Arthritis 2016    R wrist predominantly radius scaphoid and midcarpal [name]    Past Surgical History  Procedure Laterality Date  . Breast biopsy  1975  . Tracheostomy  1942    due to Strep Throat (?epiglottitis)  . [facility]oscopy   [facility]    [facility]     Current Outpatient Prescriptions  Medication Sig Dispense Refill  . hydrochlorothiazide (MICROZIDE) 12.5 MG capsule TAKE 1 CAPSULE BY MOUTH EVERY OTHER DAY 15 capsule 8  . Multiple Vitamins-Minerals (CENTRUM SILVER) tablet Take 1 tablet by mouth daily.     . polyethylene glycol (MIRALAX / GLYCOLAX) packet Take 17 g by mouth daily.      . propranolol (INDERAL) 20 MG tablet Take 20 mg by mouth 2 (two) times daily. May take 1 extra tablet by mouth once daily (for a total of 3 tablets daily) if needed for rapid heart rate    . sulfaSALAzine (AZULFIDINE) 500 MG tablet Take 2 tablets (1,000 mg total) by mouth daily. 180 tablet 2  . Wheat Dextrin (BENEFIBER PO) Take 1 Dose by mouth daily as needed (constipation).      No current facility-administered medications for this visit.    Allergies:   Codeine; Cortisone; and Guaifenesin er    Social History:  The patient  reports that she has never smoked. She has never used smokeless tobacco. She reports that she does not drink alcohol or use illicit drugs.   Family History:  The patient's family history includes Cancer in her paternal grandfather; Heart disease in her mother; Hypertension in her mother; Melanoma in her brother and father; Rheum arthritis in her father; Stroke in her mother.  ROS:  Please see the history of present illness.   Otherwise, review of systems are positive for none.   All other systems are reviewed and negative.    PHYSICAL EXAM: VS:  BP 110/76 mmHg  Pulse 66  Ht 5' 1"  (1.549 m)  Wt 146 lb 12.8 oz (66.588 kg)  BMI 27.75 kg/m2 , BMI Body mass index is 27.75 kg/(m^2). GEN: Well nourished, well developed, in no acute distress HEENT: normal Neck: no JVD, carotid bruits, or masses Cardiac: RRR; no murmurs, rubs, or gallops, no click heard today,,no edema  Respiratory:  clear to auscultation bilaterally, normal work of breathing GI: soft, nontender, nondistended, + BS MS: no deformity or atrophy Skin:  warm and dry, no rash Neuro:  Strength and sensation are intact Psych: euthymic mood, full affect   EKG:  EKG is not ordered today.    Recent Labs: 01/03/2015: ALT 16; BUN 24*; Creatinine, Ser 1.32*; Potassium 4.6; Sodium 141    Lipid Panel    Component Value Date/Time   CHOL 199 01/03/2015 1003   TRIG 111.0 01/03/2015 1003   HDL 48.80 01/03/2015 1003   CHOLHDL 4 01/03/2015 1003   VLDL 22.2 01/03/2015 1003   LDLCALC 128* 01/03/2015 1003   LDLDIRECT 132.6 12/27/2012 1146      Wt Readings from Last 3 Encounters:  06/26/15 146 lb 12.8 oz (66.588 kg)  05/07/15 150 lb 4 oz (68.153 kg)  01/03/15 147 lb 12.8 oz (67.042 kg)        ASSESSMENT AND PLAN:  1. essential hypertension without heart failure.  Blood pressure under good control.  Continue current medication. 2. mitral valve prolapse, no tachycardia or chest pain. 3. Dyslipidemia, lab work being drawn today 4. ulcerative colitis and ulcerative proctitis followed by [facility]. Dr. Hilarie Fredrickson has taken over for Dr. Verl Blalock 5. Postherpetic neuralgia of left chest 6.  Unwanted weight gain.  We will check TSH today   Current medicines are reviewed at length with the patient today.  The patient does not have concerns regarding medicines.  The following changes have been made:  no change  Labs/ tests ordered today include:   Orders Placed This Encounter  Procedures  . Lipid panel  . TSH  . Hepatic function panel  . Basic metabolic panel    Disposition: Lab work today pending.  Continue current medication.  Recheck in 6 months for office visit and EKG with Dr. Claiborne Billings at [facility].  [name] Spare MD 06/26/2015 10:16 AM    [address], [place], [place]  [address] Phone: [phone]; Fax: [phone]

## 2015-06-26 NOTE — Progress Notes (Signed)
Quick Note:  Please report to patient. The recent labs are stable. Continue same medication and careful diet. The LDL is slightly higher. The thyroid hormone level is normal. ______

## 2015-06-26 NOTE — Patient Instructions (Signed)
Medication Instructions:  Your physician recommends that you continue on your current medications as directed. Please refer to the Current Medication list given to you today.  Labwork: Lp/bmet/hfp/tsh  Testing/Procedures: none  Follow-Up: Your physician wants you to follow-up in: 6 month ov/ekg with Dr Claiborne Billings at the [facility] office You will receive a reminder letter in the mail two months in advance. If you don't receive a letter, please call our office to schedule the follow-up appointment.  If you need a refill on your cardiac medications before your next appointment, please call your pharmacy.

## 2015-07-07 ENCOUNTER — Ambulatory Visit: Payer: Medicare Other | Admitting: Cardiology

## 2015-07-08 ENCOUNTER — Telehealth: Payer: Self-pay | Admitting: Cardiology

## 2015-07-08 NOTE — Telephone Encounter (Signed)
New message     Pt received a copy of her lab results.  She has a question

## 2015-07-08 NOTE — Telephone Encounter (Signed)
Patient received lab results in mail and didn't know hepatic was liver function.We went over results together

## 2015-10-16 ENCOUNTER — Telehealth: Payer: Self-pay | Admitting: Family Medicine

## 2015-10-16 NOTE — Telephone Encounter (Signed)
Per [facility], last seen 04/2013 as new pt. Pt needs to schedule cpe with Dr. Darnell Level. LVM to call back

## 2015-10-20 ENCOUNTER — Telehealth: Payer: Self-pay | Admitting: Cardiovascular Disease

## 2015-10-20 NOTE — Telephone Encounter (Signed)
Returned call to pt. She is requesting to only be seen by Dr Claiborne Billings. His schedule is full until the next months are opened for Sept and October. Pt understands and requested to be added to a waiting list. This was done. Pt will call back in July to check on an appt.

## 2015-10-20 NOTE — Telephone Encounter (Signed)
New Message   Pt is a former [facility] pt calling to schedule appt. None avil for Dr. Claiborne Billings and she only wants to see him. Pt requested a call from RN to try to be filled in his schedule. Please call back to advise

## 2015-12-01 ENCOUNTER — Telehealth: Payer: Self-pay | Admitting: Cardiovascular Disease

## 2015-12-01 DIAGNOSIS — R002 Palpitations: Secondary | ICD-10-CM

## 2015-12-01 DIAGNOSIS — R7989 Other specified abnormal findings of blood chemistry: Secondary | ICD-10-CM

## 2015-12-01 DIAGNOSIS — R5383 Other fatigue: Secondary | ICD-10-CM

## 2015-12-01 DIAGNOSIS — I119 Hypertensive heart disease without heart failure: Secondary | ICD-10-CM

## 2015-12-01 DIAGNOSIS — R945 Abnormal results of liver function studies: Secondary | ICD-10-CM

## 2015-12-01 DIAGNOSIS — Z79899 Other long term (current) drug therapy: Secondary | ICD-10-CM

## 2015-12-01 DIAGNOSIS — E7800 Pure hypercholesterolemia, unspecified: Secondary | ICD-10-CM

## 2015-12-01 NOTE — Telephone Encounter (Signed)
Returned call to pt-pt requesting lab work that is suppose to be done every 6 months-former pt of MD [facility].    Reports she is suppose to have Hepatic function and BMET every 6 months.  Pt is concerned because she last had them checked in February 2017, first appt with MD Claiborne Billings is 10/25.    Spoke with Primary RN-Will place order for lab work at [address] location (pt request) prior to appt with MD Claiborne Billings.    Pt made aware of lab orders and pt requesting to have them done Aug 1st-placed on schedule at [facility] lab.  Pt aware to be fasting and location of appt.

## 2015-12-01 NOTE — Telephone Encounter (Signed)
New Message  Pt called to make an appt for lab work.. I'm not showing an order for labs. Please call back to discuss

## 2015-12-09 ENCOUNTER — Other Ambulatory Visit: Payer: Medicare Other | Admitting: *Deleted

## 2015-12-09 ENCOUNTER — Encounter (INDEPENDENT_AMBULATORY_CARE_PROVIDER_SITE_OTHER): Payer: Self-pay

## 2015-12-09 DIAGNOSIS — R7989 Other specified abnormal findings of blood chemistry: Secondary | ICD-10-CM

## 2015-12-09 DIAGNOSIS — R945 Abnormal results of liver function studies: Secondary | ICD-10-CM

## 2015-12-09 DIAGNOSIS — E7800 Pure hypercholesterolemia, unspecified: Secondary | ICD-10-CM | POA: Diagnosis not present

## 2015-12-09 DIAGNOSIS — Z79899 Other long term (current) drug therapy: Secondary | ICD-10-CM

## 2015-12-09 DIAGNOSIS — R5383 Other fatigue: Secondary | ICD-10-CM | POA: Diagnosis not present

## 2015-12-09 LAB — TSH: TSH: 3.97 mIU/L

## 2015-12-09 LAB — CBC
HEMATOCRIT: 34.5 % — AB (ref 35.0–45.0)
HEMOGLOBIN: 11.7 g/dL (ref 11.7–15.5)
MCH: 32.8 pg (ref 27.0–33.0)
MCHC: 33.9 g/dL (ref 32.0–36.0)
MCV: 96.6 fL (ref 80.0–100.0)
MPV: 9.6 fL (ref 7.5–12.5)
Platelets: 313 10*3/uL (ref 140–400)
RBC: 3.57 MIL/uL — AB (ref 3.80–5.10)
RDW: 13.3 % (ref 11.0–15.0)
WBC: 6.7 10*3/uL (ref 3.8–10.8)

## 2015-12-09 LAB — LIPID PANEL
CHOL/HDL RATIO: 3.4 ratio (ref <=5.0)
Cholesterol: 185 mg/dL (ref 125–200)
HDL: 54 mg/dL (ref >=46)
LDL CALC: 110 mg/dL (ref <130)
TRIGLYCERIDES: 103 mg/dL (ref <150)
VLDL: 21 mg/dL (ref <30)

## 2015-12-09 LAB — COMPREHENSIVE METABOLIC PANEL
ALK PHOS: 64 U/L (ref 33–130)
ALT: 14 U/L (ref 6–29)
AST: 18 U/L (ref 10–35)
Albumin: 4 g/dL (ref 3.6–5.1)
BUN: 24 mg/dL (ref 7–25)
CALCIUM: 9 mg/dL (ref 8.6–10.4)
CHLORIDE: 106 mmol/L (ref 98–110)
CO2: 28 mmol/L (ref 20–31)
Creat: 1.6 mg/dL — ABNORMAL HIGH (ref 0.60–0.93)
GLUCOSE: 91 mg/dL (ref 65–99)
POTASSIUM: 4.8 mmol/L (ref 3.5–5.3)
SODIUM: 140 mmol/L (ref 135–146)
TOTAL PROTEIN: 6 g/dL — AB (ref 6.1–8.1)
Total Bilirubin: 0.4 mg/dL (ref 0.2–1.2)

## 2015-12-31 ENCOUNTER — Ambulatory Visit (INDEPENDENT_AMBULATORY_CARE_PROVIDER_SITE_OTHER): Payer: Medicare Other | Admitting: Ophthalmology

## 2015-12-31 DIAGNOSIS — I10 Essential (primary) hypertension: Secondary | ICD-10-CM

## 2015-12-31 DIAGNOSIS — H35033 Hypertensive retinopathy, bilateral: Secondary | ICD-10-CM | POA: Diagnosis not present

## 2015-12-31 DIAGNOSIS — H2513 Age-related nuclear cataract, bilateral: Secondary | ICD-10-CM

## 2015-12-31 DIAGNOSIS — H353121 Nonexudative age-related macular degeneration, left eye, early dry stage: Secondary | ICD-10-CM | POA: Diagnosis not present

## 2015-12-31 DIAGNOSIS — H353112 Nonexudative age-related macular degeneration, right eye, intermediate dry stage: Secondary | ICD-10-CM

## 2015-12-31 DIAGNOSIS — H43813 Vitreous degeneration, bilateral: Secondary | ICD-10-CM

## 2016-01-05 ENCOUNTER — Other Ambulatory Visit: Payer: Self-pay | Admitting: *Deleted

## 2016-01-05 ENCOUNTER — Telehealth: Payer: Self-pay | Admitting: Cardiovascular Disease

## 2016-01-05 MED ORDER — HYDROCHLOROTHIAZIDE 12.5 MG PO CAPS: ORAL_CAPSULE | ORAL | 0 refills | Status: DC

## 2016-01-05 MED ORDER — PROPRANOLOL HCL 20 MG PO TABS: 20.0000 mg | ORAL_TABLET | Freq: Two times a day (BID) | ORAL | 0 refills | Status: DC

## 2016-01-05 NOTE — Telephone Encounter (Signed)
New Message  Pt voiced she is calling to get her results from her lab/blood work from 62.1.17 and she hasn't heard anything back.  Please follow up with pt. Thanks!

## 2016-01-05 NOTE — Telephone Encounter (Signed)
Returned patient call-made aware MD Claiborne Billings needs to review lab work and if further recommendations were made we would contact her.  Pt verbalized understanding.  Also aware of f/u appt with MD Claiborne Billings 10/16 @ 815 at [facility] location.

## 2016-01-15 DIAGNOSIS — H353131 Nonexudative age-related macular degeneration, bilateral, early dry stage: Secondary | ICD-10-CM | POA: Diagnosis not present

## 2016-01-19 ENCOUNTER — Ambulatory Visit (INDEPENDENT_AMBULATORY_CARE_PROVIDER_SITE_OTHER): Payer: Medicare Other | Admitting: Ophthalmology

## 2016-01-27 ENCOUNTER — Telehealth: Payer: Self-pay | Admitting: *Deleted

## 2016-01-27 NOTE — Telephone Encounter (Signed)
LEFT MESSAGE TO RETURN A CALL TO ME.

## 2016-02-23 ENCOUNTER — Ambulatory Visit (INDEPENDENT_AMBULATORY_CARE_PROVIDER_SITE_OTHER): Payer: Medicare Other | Admitting: Cardiovascular Disease

## 2016-02-23 ENCOUNTER — Other Ambulatory Visit: Payer: Self-pay | Admitting: Internal Medicine

## 2016-02-23 ENCOUNTER — Encounter: Payer: Self-pay | Admitting: Cardiovascular Disease

## 2016-02-23 VITALS — BP 156/81 | HR 63 | Ht 61.5 in | Wt 148.8 lb

## 2016-02-23 DIAGNOSIS — Z79899 Other long term (current) drug therapy: Secondary | ICD-10-CM

## 2016-02-23 DIAGNOSIS — I119 Hypertensive heart disease without heart failure: Secondary | ICD-10-CM

## 2016-02-23 DIAGNOSIS — I341 Nonrheumatic mitral (valve) prolapse: Secondary | ICD-10-CM | POA: Diagnosis not present

## 2016-02-23 DIAGNOSIS — R002 Palpitations: Secondary | ICD-10-CM | POA: Diagnosis not present

## 2016-02-23 MED ORDER — METOPROLOL SUCCINATE ER 25 MG PO TB24: 25.0000 mg | ORAL_TABLET | Freq: Every day | ORAL | 6 refills | Status: DC

## 2016-02-23 MED ORDER — PROPRANOLOL HCL 20 MG PO TABS: 20.0000 mg | ORAL_TABLET | ORAL | 0 refills | Status: DC | PRN

## 2016-02-23 NOTE — Patient Instructions (Signed)
Medication Instructions:  The propanolol has been changed to take only as needed for breakthrough palpitations. Start new prescription for metoprolol succ 25 mg.  Labwork:  CBC and B-MET 1 week ( call [address] to schedule)   Testing/Procedures:  NONE  Follow-Up:  3 months with Dr Claiborne Billings  Any Other Special Instructions Will Be Listed Below (If Applicable).

## 2016-02-25 NOTE — Progress Notes (Signed)
Cardiology Office Note    Date:  02/25/2016   ID:  Diana Brewer, DOB 06/02/1933, MRN [id number]  PCP:  [name], MD  Cardiologist:  [name], MD   Chief Complaint  Patient presents with  . Follow-up    6 month  former Dr. Mady Gemma c/o weight gain    History of Present Illness:  Diana Brewer is a 82 y.o. female who has been followed by Dr. Mare Ferrari almost 30 years.  I have taking care of several of her family members and she presents to the office today to establish cardiology care with me.  Diana Brewer has a long-standing history of hypertension, as well as mitral valve prolapse.  She has a history of hyperlipidemia, as well as chronic inflammatory proctitis.  She has been maintained on propranolol 20 mg twice a day for palpitations and at times has to take extra pill for breakthrough ectopy.  She has complained that her weight has been gradually increasing.  Dr. Rush Landmark had previously checked a TSH.  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  She denies any recent episodes of chest pain.  She denies any change in development of shortness of breath.  He presents for evaluation    Past Medical History:  Diagnosis Date  . 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 History of migraine remote  . History of shingles   . HLD (hyperlipidemia)   . Hypertension   . Macular degeneration    [name]  . MVP (mitral valve prolapse)   . Palpitations   . Ulcerative colitis ([place])    left sided - proctosigmoiditis ([name])    Past Surgical History:  Procedure Laterality Date  . BREAST BIOPSY  1975  . COLONOSCOPY  2011   [facility]  . TRACHEOSTOMY  1942   due to Strep Throat (?epiglottitis)    Current Medications: Outpatient Medications Prior to Visit  Medication Sig Dispense Refill  . hydrochlorothiazide (MICROZIDE) 12.5 MG capsule TAKE 1 CAPSULE BY  MOUTH EVERY OTHER DAY 45 capsule 0  . Multiple Vitamins-Minerals (CENTRUM SILVER) tablet Take 1 tablet by mouth daily.     . polyethylene glycol (MIRALAX / GLYCOLAX) packet Take 17 g by mouth daily.      . Wheat Dextrin (BENEFIBER PO) Take 1 Dose by mouth daily as needed (constipation).     . propranolol (INDERAL) 20 MG tablet Take 1 tablet (20 mg total) by mouth 2 (two) times daily. May take an extra tablet by mouth daily as needed for rapid heart rate. 190 tablet 0  . sulfaSALAzine (AZULFIDINE) 500 MG tablet Take 2 tablets (1,000 mg total) by mouth daily. 180 tablet 2   No facility-administered medications prior to visit.      Allergies:   Codeine; Cortisone; and Guaifenesin er   Social History   Social History  . Marital status: Married    Spouse name: N/A  . Number of children: N/A  . Years of education: N/A   Social History Main Topics  . Smoking status: Never Smoker  . Smokeless tobacco: Never Used  . Alcohol use No  . Drug use: No  . Sexual activity: No   Other Topics Concern  . None   Social History Narrative   Lives with husband   Grown child (daughter)     Family History  family history includes Cancer in her paternal grandfather; Heart disease  in her mother; Hypertension in her mother; Melanoma in her brother and father; Rheum arthritis in her father; Stroke in her mother.   ROS General: Negative; No fevers, chills, or night sweats;  HEENT: Negative; No changes in vision or hearing, sinus congestion, difficulty swallowing Pulmonary: Negative; No cough, wheezing, shortness of breath, hemoptysis Cardiovascular: Positive for MVP, hypertension, and intermittent palpitations GI: Negative; No nausea, vomiting, diarrhea, or abdominal pain GU: Negative; No dysuria, hematuria, or difficulty voiding Musculoskeletal: Negative; no myalgias, joint pain, or weakness Hematologic/Oncology: Negative; no easy bruising, bleeding Endocrine: Negative; no heat/cold intolerance; no  diabetes Neuro: Negative; no changes in balance, headaches Skin: Negative; No rashes or skin lesions Psychiatric: Negative; No behavioral problems, depression Sleep: Negative; No snoring, daytime sleepiness, hypersomnolence, bruxism, restless legs, hypnogognic hallucinations, no cataplexy Other comprehensive 14 point system review is negative.   PHYSICAL EXAM:   VS:  BP (!) 156/81 (BP Location: Right Arm, Patient Position: Sitting, Cuff Size: Normal)   Pulse 63   Ht 5' 1.5" (1.562 m)   Wt 148 lb 12.8 oz (67.5 kg)   BMI 27.66 kg/m    Wt Readings from Last 3 Encounters:  02/23/16 148 lb 12.8 oz (67.5 kg)  06/26/15 146 lb 12.8 oz (66.6 kg)  05/07/15 150 lb 4 oz (68.2 kg)    General: Alert, oriented, no distress.  Skin: normal turgor, no rashes, warm and dry HEENT: Normocephalic, atraumatic. Pupils equal round and reactive to light; sclera anicteric; extraocular muscles intact; Fundi Without hemorrhages or exudates. Nose without nasal septal hypertrophy Mouth/Parynx benign; Mallinpatti scale Neck: No JVD, no carotid bruits; normal carotid upstroke Lungs: clear to ausculatation and percussion; no wheezing or rales Chest wall: without tenderness to palpitation Heart: PMI not displaced, RRR, s1 s2 normal, 1/6 systolic murmur, intermittent systolic click;  no diastolic murmur, no rubs, gallops, thrills, or heaves Abdomen: soft, nontender; no hepatosplenomehaly, BS+; abdominal aorta nontender and not dilated by palpation. Back: no CVA tenderness Pulses 2+ Musculoskeletal: full range of motion, normal strength, no joint deformities Extremities: no clubbing cyanosis or edema, Homan's sign negative  Neurologic: grossly nonfocal; Cranial nerves grossly wnl Psychologic: Normal mood and affect   Studies/Labs Reviewed:   ECG (independently read by me): Normal sinus rhythm at 63 bpm.  No ectopy.  Normal intervals.  Recent Labs: BMP Latest Ref Rng & Units 12/09/2015 06/26/2015 01/03/2015    Glucose 65 - 99 mg/dL 91 86 91  BUN 7 - 25 mg/dL 24 21 24(H)  Creatinine 0.60 - 0.93 mg/dL 1.60(H) 1.31(H) 1.32(H)  Sodium 135 - 146 mmol/L 140 140 141  Potassium 3.5 - 5.3 mmol/L 4.8 4.4 4.6  Chloride 98 - 110 mmol/L 106 105 104  CO2 20 - 31 mmol/L 28 30 31   Calcium 8.6 - 10.4 mg/dL 9.0 9.5 9.5     Hepatic Function Latest Ref Rng & Units 12/09/2015 06/26/2015 01/03/2015  Total Protein 6.1 - 8.1 g/dL 6.0(L) 6.4 6.6  Albumin 3.6 - 5.1 g/dL 4.0 4.2 4.0  AST 10 - 35 U/L 18 22 22   ALT 6 - 29 U/L 14 17 16   Alk Phosphatase 33 - 130 U/L 64 78 83  Total Bilirubin 0.2 - 1.2 mg/dL 0.4 0.6 0.4  Bilirubin, Direct <=0.2 mg/dL - 0.1 0.0    CBC Latest Ref Rng & Units 12/09/2015 08/13/2012 06/30/2011  WBC 3.8 - 10.8 K/uL 6.7 6.3 7.1  Hemoglobin 11.7 - 15.5 g/dL 11.7 12.3 12.7  Hematocrit 35.0 - 45.0 % 34.5(L) 40.2 38.1  Platelets 140 -  400 K/uL 313 - 302.0   Lab Results  Component Value Date   MCV 96.6 12/09/2015   MCV 99.0 (A) 08/13/2012   MCV 97.8 06/30/2011   Lab Results  Component Value Date   TSH 3.97 12/09/2015   No results found for: HGBA1C   BNP No results found for: BNP  ProBNP No results found for: PROBNP   Lipid Panel     Component Value Date/Time   CHOL 185 12/09/2015 0758   TRIG 103 12/09/2015 0758   HDL 54 12/09/2015 0758   CHOLHDL 3.4 12/09/2015 0758   VLDL 21 12/09/2015 0758   LDLCALC 110 12/09/2015 0758   LDLDIRECT 132.6 12/27/2012 1146     RADIOLOGY: No results found.   Additional studies/ records that were reviewed today include:  I reviewed previous records by Dr. Mare Ferrari.    ASSESSMENT:    1. Benign hypertensive heart disease without heart failure   2. Rapid palpitations   3. Drug therapy   4. MVP (mitral valve prolapse)      PLAN:  Diana Brewer Is a 82 year old female who has a history of mitral valve prolapse, hypertension, and palpitations.  She has been on long-standing treatment with propranolol and takes this 20 mg twice a day, but at  times has to take additional dose due to recurrent palpitations.  Her blood pressure today was elevated when checked by me at 150/81.  I have strongly recommended that we substitute short-acting propranolol to Toprol-XL 25 mg daily.  She can still take her propranolol 20 mg on a when necessary basis if she develops recurrent ectopy with long acting beta blocker treatment.  She also has noticed him intermittent edema for which she was started on HCTZ 12.5 mg by her GI physician and she has been taking this every other day.   I have reviewed recent laboratory. She has stage III chronic kidney disease.  I am recommending a follow-up BMET and CBC be obtained in one week.   Medication Adjustments/Labs and Tests Ordered: Current medicines are reviewed at length with the patient today.  Concerns regarding medicines are outlined above.  Medication changes, Labs and Tests ordered today are listed in the Patient Instructions below. Patient Instructions  Medication Instructions:  The propanolol has been changed to take only as needed for breakthrough palpitations. Start new prescription for metoprolol succ 25 mg.  Labwork:  CBC and B-MET 1 week ( call [address] to schedule)   Testing/Procedures:  NONE  Follow-Up:  3 months with Dr Claiborne Billings  Any Other Special Instructions Will Be Listed Below (If Applicable).     Signed, [name], MD  02/25/2016 7:11 PM    [facility] [address] [place], [place], [place]  [address] Phone: [phone]

## 2016-03-06 ENCOUNTER — Other Ambulatory Visit: Payer: Self-pay | Admitting: Cardiovascular Disease

## 2016-03-08 NOTE — Telephone Encounter (Signed)
Rx(s) sent to pharmacy electronically.  

## 2016-03-10 ENCOUNTER — Other Ambulatory Visit: Payer: Medicare Other | Admitting: *Deleted

## 2016-03-10 DIAGNOSIS — E7800 Pure hypercholesterolemia, unspecified: Secondary | ICD-10-CM

## 2016-03-10 DIAGNOSIS — I119 Hypertensive heart disease without heart failure: Secondary | ICD-10-CM | POA: Diagnosis not present

## 2016-03-10 DIAGNOSIS — R002 Palpitations: Secondary | ICD-10-CM

## 2016-03-10 LAB — CBC WITH DIFFERENTIAL/PLATELET
Basophils Absolute: 89 cells/uL (ref 0–200)
Basophils Relative: 1 %
EOS PCT: 3 %
Eosinophils Absolute: 267 cells/uL (ref 15–500)
HEMATOCRIT: 33.5 % — AB (ref 35.0–45.0)
Hemoglobin: 11.3 g/dL — ABNORMAL LOW (ref 11.7–15.5)
LYMPHS PCT: 20 %
Lymphs Abs: 1780 cells/uL (ref 850–3900)
MCH: 32.6 pg (ref 27.0–33.0)
MCHC: 33.7 g/dL (ref 32.0–36.0)
MCV: 96.5 fL (ref 80.0–100.0)
MPV: 9.6 fL (ref 7.5–12.5)
Monocytes Absolute: 979 cells/uL — ABNORMAL HIGH (ref 200–950)
Monocytes Relative: 11 %
NEUTROS PCT: 65 %
Neutro Abs: 5785 cells/uL (ref 1500–7800)
Platelets: 318 10*3/uL (ref 140–400)
RBC: 3.47 MIL/uL — AB (ref 3.80–5.10)
RDW: 13.9 % (ref 11.0–15.0)
WBC: 8.9 10*3/uL (ref 3.8–10.8)

## 2016-03-10 LAB — BASIC METABOLIC PANEL
BUN: 17 mg/dL (ref 7–25)
CALCIUM: 9.3 mg/dL (ref 8.6–10.4)
CO2: 28 mmol/L (ref 20–31)
CREATININE: 1.24 mg/dL — AB (ref 0.60–0.88)
Chloride: 106 mmol/L (ref 98–110)
GLUCOSE: 85 mg/dL (ref 65–99)
Potassium: 4.3 mmol/L (ref 3.5–5.3)
Sodium: 142 mmol/L (ref 135–146)

## 2016-03-11 ENCOUNTER — Encounter: Payer: Self-pay | Admitting: *Deleted

## 2016-03-26 ENCOUNTER — Telehealth: Payer: Self-pay | Admitting: Family Medicine

## 2016-03-26 NOTE — Telephone Encounter (Signed)
When I sent the note I could not get into [employer] [facility] portal but under final disposition user it had Home care by the RN. I now can get into the portal and this is the home care instructions.    PLEASE NOTE: All timestamps contained within this report are represented as [place] Standard Time. CONFIDENTIALTY NOTICE: This fax transmission is intended only for the addressee. It contains information that is legally privileged, confidential or otherwise protected from use or disclosure. If you are not the intended recipient, you are strictly prohibited from reviewing, disclosing, copying using or disseminating any of this information or taking any action in reliance on or regarding this information. If you have received this fax in error, please notify us immediately by telephone so that we can arrange for its return to [place]. Phone: [phone], Toll-Free: [phone], Fax: [phone] Page: 1 of 2 Call Id: [id number] [place] Patient Name: Diana Brewer Gender: Female DOB: 07/26/1933 Age: 82 Y 18 D Return Phone Number: [phone] (Primary) Address: City/State/Zip: [place] Client [place] [facility] - Client Client Site [place] Physician [name] - MD Contact Type Call Who Is Calling Patient / Member / Family / Caregiver Call Type Triage / Clinical Relationship To Patient Self Return Phone Number [phone] (Primary) Chief Complaint Sore Throat Reason for Call Symptomatic / Request for Health Information Initial Comment Caller states throat hurts when she coughs. Appointment Disposition EMR Appointment Not Necessary Info pasted into Epic Yes PreDisposition Did not know what to do Translation No Nurse Assessment Nurse: Dimas Chyle, RN, Dellis Filbert Date/Time Eilene Ghazi Time): 03/26/2016 10:49:16 AM Confirm and document reason for call. If  symptomatic, describe symptoms. You must click the next button to save text entered. ---Caller states throat hurts when she coughs. Symptoms for 3 days. No fever. Has the patient traveled out of the country within the last 30 days? ---No Does the patient have any new or worsening symptoms? ---Yes Will a triage be completed? ---Yes Related visit to physician within the last 2 weeks? ---No Does the PT have any chronic conditions? (i.e. diabetes, asthma, etc.) ---Yes List chronic conditions. ---HTN Is this a behavioral health or substance abuse call? ---No Guidelines Guideline Title Affirmed Question Affirmed Notes Nurse Date/Time Eilene Ghazi Time) Hoarseness Mild hoarseness (all triage questions negative) Dimas Chyle, RN, Dellis Filbert 03/26/2016 10:51:01 AM Disp. Time Eilene Ghazi Time) Disposition Final User 03/26/2016 10:56:07 AM Home Care Yes Dimas Chyle, RN, Dellis Filbert PLEASE NOTE: All timestamps contained within this report are represented as [place] Standard Time. CONFIDENTIALTY NOTICE: This fax transmission is intended only for the addressee. It contains information that is legally privileged, confidential or otherwise protected from use or disclosure. If you are not the intended recipient, you are strictly prohibited from reviewing, disclosing, copying using or disseminating any of this information or taking any action in reliance on or regarding this information. If you have received this fax in error, please notify us immediately by telephone so that we can arrange for its return to [place]. Phone: [phone], Toll-Free: [phone], Fax: [phone] Page: 2 of 2 Call Id: [id number] Caller Understands: Yes Disagree/Comply: Comply Care Advice Given Per Guideline HOME CARE: You should be able to treat this at home. REASSURANCE: * Most hoarseness is an irritation of the voice box (called laryngitis) that's part of a cold. It usually lasts under 1 week. * Hoarseness due to vocal cord abuse (yelling) can  have a  slower recovery. * Some people can have hoarseness from nasal allergies (Hay Fever). WARM LIQUIDS soothing, warm liquids. Hot water with honey is good. Hot tea with sugar and lemon is also good. Or, you can suck on hard candy or cough drops. HUMIDIFIER: If the air in your home is dry, use a humidifier. REST THE VOICE: Avoid yelling or talking loudly. Talk as little as possible or write notes for a few days. Also avoid throat-clearing. Talking and even throat clearing can strain the vocal cords. ANTIHISTAMINE FOR NASAL ALLERGIES: * If you have nasal allergies, take an antihistamine until improved. * Chlorpheniramine (Chlortrimeton) is available OTC. Adult dose is 4 mg PO. * Diphenhydramine (Benadryl) is available OTC. Adult dose is 25-50 mg PO. CAUTION - ANTIHISTAMINES: * Examples include diphenhydramine (Benadryl) and chlorpheniramine (Chlortrimeton, Chlor-tripolon) * May cause sleepiness. Do not drink alcohol, drive or operate dangerous machinery while taking antihistamines. * Do not take these medicines if you have prostate problems. * Read the package instructions and warnings. CONTAGIOUSNESS: Colds and upper respiratory infections are contagious. Allergies and voice abuse are not contagious. CALL BACK IF: * Difficulty breathing occurs * Hoarseness lasts over 2 weeks * You become worse. CARE ADVICE given per Hoarseness (Adult) guideline.

## 2016-03-26 NOTE — Telephone Encounter (Signed)
[facility] Patient Name: HIROMI KNODEL DOB: 09-11-1933 Initial Comment Caller states throat hurts when she coughs. Nurse Assessment Nurse: Dimas Chyle, RN, Dellis Filbert Date/Time Eilene Ghazi Time): 03/26/2016 10:49:16 AM Confirm and document reason for call. If symptomatic, describe symptoms. You must click the next button to save text entered. ---Caller states throat hurts when she coughs. Symptoms for 3 days. No fever. Has the patient traveled out of the country within the last 30 days? ---No Does the patient have any new or worsening symptoms? ---Yes Will a triage be completed? ---Yes Related visit to physician within the last 2 weeks? ---No Does the PT have any chronic conditions? (i.e. diabetes, asthma, etc.) ---Yes List chronic conditions. ---HTN Is this a behavioral health or substance abuse call? ---No Guidelines Guideline Title Affirmed Question Affirmed Notes Hoarseness Mild hoarseness (all triage questions negative) Final Disposition User Borden, RN, Dellis Filbert Disagree/Comply: Comply

## 2016-03-26 NOTE — Telephone Encounter (Signed)
Noted. Thanks.

## 2016-03-26 NOTE — Telephone Encounter (Signed)
Was triaging done? What was recommendation for patient? There is no mention of this.

## 2016-04-04 ENCOUNTER — Other Ambulatory Visit: Payer: Self-pay | Admitting: Cardiovascular Disease

## 2016-04-05 NOTE — Telephone Encounter (Signed)
REFILL 

## 2016-04-21 ENCOUNTER — Ambulatory Visit: Payer: Medicare Other

## 2016-04-28 ENCOUNTER — Encounter: Payer: Self-pay | Admitting: Family Medicine

## 2016-04-28 ENCOUNTER — Ambulatory Visit (INDEPENDENT_AMBULATORY_CARE_PROVIDER_SITE_OTHER): Payer: Medicare Other | Admitting: Family Medicine

## 2016-04-28 VITALS — BP 140/80 | HR 88 | Temp 98.1°F | Ht 61.0 in | Wt 148.2 lb

## 2016-04-28 DIAGNOSIS — M19039 Primary osteoarthritis, unspecified wrist: Secondary | ICD-10-CM

## 2016-04-28 DIAGNOSIS — Z0000 Encounter for general adult medical examination without abnormal findings: Secondary | ICD-10-CM | POA: Insufficient documentation

## 2016-04-28 DIAGNOSIS — I119 Hypertensive heart disease without heart failure: Secondary | ICD-10-CM

## 2016-04-28 DIAGNOSIS — E7800 Pure hypercholesterolemia, unspecified: Secondary | ICD-10-CM | POA: Diagnosis not present

## 2016-04-28 DIAGNOSIS — K5120 Ulcerative (chronic) proctitis without complications: Secondary | ICD-10-CM

## 2016-04-28 DIAGNOSIS — Z7189 Other specified counseling: Secondary | ICD-10-CM | POA: Insufficient documentation

## 2016-04-28 NOTE — Patient Instructions (Addendum)
Nice to see you today. Increase calcium in diet (dairy products).  Goal vitamin D 1000 units daily.   Return as needed or in 1 year for next medicare wellness visit  Health Maintenance, Female Introduction Adopting a healthy lifestyle and getting preventive care can go a long way to promote health and wellness. Talk with your health care provider about what schedule of regular examinations is right for you. This is a good chance for you to check in with your provider about disease prevention and staying healthy. In between checkups, there are plenty of things you can do on your own. Experts have done a lot of research about which lifestyle changes and preventive measures are most likely to keep you healthy. Ask your health care provider for more information. Weight and diet Eat a healthy diet  Be sure to include plenty of vegetables, fruits, low-fat dairy products, and lean protein.  Do not eat a lot of foods high in solid fats, added sugars, or salt.  Get regular exercise. This is one of the most important things you can do for your health.  Most adults should exercise for at least 150 minutes each week. The exercise should increase your heart rate and make you sweat (moderate-intensity exercise).  Most adults should also do strengthening exercises at least twice a week. This is in addition to the moderate-intensity exercise. Maintain a healthy weight  Body mass index (BMI) is a measurement that can be used to identify possible weight problems. It estimates body fat based on height and weight. Your health care provider can help determine your BMI and help you achieve or maintain a healthy weight.  For females 52 years of age and older:  A BMI below 18.5 is considered underweight.  A BMI of 18.5 to 24.9 is normal.  A BMI of 25 to 29.9 is considered overweight.  A BMI of 30 and above is considered obese. Watch levels of cholesterol and blood lipids  You should start having your  blood tested for lipids and cholesterol at 80 years of age, then have this test every 5 years.  You may need to have your cholesterol levels checked more often if:  Your lipid or cholesterol levels are high.  You are older than 80 years of age.  You are at high risk for heart disease. Cancer screening Lung Cancer  Lung cancer screening is recommended for adults 62-76 years old who are at high risk for lung cancer because of a history of smoking.  A yearly low-dose CT scan of the lungs is recommended for people who:  Currently smoke.  Have quit within the past 15 years.  Have at least a 30-pack-year history of smoking. A pack year is smoking an average of one pack of cigarettes a day for 1 year.  Yearly screening should continue until it has been 15 years since you quit.  Yearly screening should stop if you develop a health problem that would prevent you from having lung cancer treatment. Breast Cancer  Practice breast self-awareness. This means understanding how your breasts normally appear and feel.  It also means doing regular breast self-exams. Let your health care provider know about any changes, no matter how small.  If you are in your 20s or 30s, you should have a clinical breast exam (CBE) by a health care provider every 1-3 years as part of a regular health exam.  If you are 34 or older, have a CBE every year. Also consider having a breast  X-ray (mammogram) every year.  If you have a family history of breast cancer, talk to your health care provider about genetic screening.  If you are at high risk for breast cancer, talk to your health care provider about having an MRI and a mammogram every year.  Breast cancer gene (BRCA) assessment is recommended for women who have family members with BRCA-related cancers. BRCA-related cancers include:  Breast.  Ovarian.  Tubal.  Peritoneal cancers.  Results of the assessment will determine the need for genetic counseling  and BRCA1 and BRCA2 testing. Cervical Cancer  Your health care provider may recommend that you be screened regularly for cancer of the pelvic organs (ovaries, uterus, and vagina). This screening involves a pelvic examination, including checking for microscopic changes to the surface of your cervix (Pap test). You may be encouraged to have this screening done every 3 years, beginning at age 51.  For women ages 10-65, health care providers may recommend pelvic exams and Pap testing every 3 years, or they may recommend the Pap and pelvic exam, combined with testing for human papilloma virus (HPV), every 5 years. Some types of HPV increase your risk of cervical cancer. Testing for HPV may also be done on women of any age with unclear Pap test results.  Other health care providers may not recommend any screening for nonpregnant women who are considered low risk for pelvic cancer and who do not have symptoms. Ask your health care provider if a screening pelvic exam is right for you.  If you have had past treatment for cervical cancer or a condition that could lead to cancer, you need Pap tests and screening for cancer for at least 20 years after your treatment. If Pap tests have been discontinued, your risk factors (such as having a new sexual partner) need to be reassessed to determine if screening should resume. Some women have medical problems that increase the chance of getting cervical cancer. In these cases, your health care provider may recommend more frequent screening and Pap tests. Colorectal Cancer  This type of cancer can be detected and often prevented.  Routine colorectal cancer screening usually begins at 80 years of age and continues through 80 years of age.  Your health care provider may recommend screening at an earlier age if you have risk factors for colon cancer.  Your health care provider may also recommend using home test kits to check for hidden blood in the stool.  A small  camera at the end of a tube can be used to examine your colon directly (sigmoidoscopy or colonoscopy). This is done to check for the earliest forms of colorectal cancer.  Routine screening usually begins at age 28.  Direct examination of the colon should be repeated every 5-10 years through 80 years of age. However, you may need to be screened more often if early forms of precancerous polyps or small growths are found. Skin Cancer  Check your skin from head to toe regularly.  Tell your health care provider about any new moles or changes in moles, especially if there is a change in a mole's shape or color.  Also tell your health care provider if you have a mole that is larger than the size of a pencil eraser.  Always use sunscreen. Apply sunscreen liberally and repeatedly throughout the day.  Protect yourself by wearing long sleeves, pants, a wide-brimmed hat, and sunglasses whenever you are outside. Heart disease, diabetes, and high blood pressure  High blood pressure causes heart  disease and increases the risk of stroke. High blood pressure is more likely to develop in:  People who have blood pressure in the high end of the normal range (130-139/85-89 mm Hg).  People who are overweight or obese.  People who are African American.  If you are 58-41 years of age, have your blood pressure checked every 3-5 years. If you are 65 years of age or older, have your blood pressure checked every year. You should have your blood pressure measured twice-once when you are at a hospital or clinic, and once when you are not at a hospital or clinic. Record the average of the two measurements. To check your blood pressure when you are not at a hospital or clinic, you can use:  An automated blood pressure machine at a pharmacy.  A home blood pressure monitor.  If you are between 7 years and 32 years old, ask your health care provider if you should take aspirin to prevent strokes.  Have regular  diabetes screenings. This involves taking a blood sample to check your fasting blood sugar level.  If you are at a normal weight and have a low risk for diabetes, have this test once every three years after 80 years of age.  If you are overweight and have a high risk for diabetes, consider being tested at a younger age or more often. Preventing infection Hepatitis B  If you have a higher risk for hepatitis B, you should be screened for this virus. You are considered at high risk for hepatitis B if:  You were born in a country where hepatitis B is common. Ask your health care provider which countries are considered high risk.  Your parents were born in a high-risk country, and you have not been immunized against hepatitis B (hepatitis B vaccine).  You have HIV or AIDS.  You use needles to inject street drugs.  You live with someone who has hepatitis B.  You have had sex with someone who has hepatitis B.  You get hemodialysis treatment.  You take certain medicines for conditions, including cancer, organ transplantation, and autoimmune conditions. Hepatitis C  Blood testing is recommended for:  Everyone born from [birth year] through 1963.  Anyone with known risk factors for hepatitis C. Sexually transmitted infections (STIs)  You should be screened for sexually transmitted infections (STIs) including gonorrhea and chlamydia if:  You are sexually active and are younger than 80 years of age.  You are older than 80 years of age and your health care provider tells you that you are at risk for this type of infection.  Your sexual activity has changed since you were last screened and you are at an increased risk for chlamydia or gonorrhea. Ask your health care provider if you are at risk.  If you do not have HIV, but are at risk, it may be recommended that you take a prescription medicine daily to prevent HIV infection. This is called pre-exposure prophylaxis (PrEP). You are considered at  risk if:  You are sexually active and do not regularly use condoms or know the HIV status of your partner(s).  You take drugs by injection.  You are sexually active with a partner who has HIV. Talk with your health care provider about whether you are at high risk of being infected with HIV. If you choose to begin PrEP, you should first be tested for HIV. You should then be tested every 3 months for as long as you are taking PrEP.  Pregnancy  If you are premenopausal and you may become pregnant, ask your health care provider about preconception counseling.  If you may become pregnant, take 400 to 800 micrograms (mcg) of folic acid every day.  If you want to prevent pregnancy, talk to your health care provider about birth control (contraception). Osteoporosis and menopause  Osteoporosis is a disease in which the bones lose minerals and strength with aging. This can result in serious bone fractures. Your risk for osteoporosis can be identified using a bone density scan.  If you are 51 years of age or older, or if you are at risk for osteoporosis and fractures, ask your health care provider if you should be screened.  Ask your health care provider whether you should take a calcium or vitamin D supplement to lower your risk for osteoporosis.  Menopause may have certain physical symptoms and risks.  Hormone replacement therapy may reduce some of these symptoms and risks. Talk to your health care provider about whether hormone replacement therapy is right for you. Follow these instructions at home:  Schedule regular health, dental, and eye exams.  Stay current with your immunizations.  Do not use any tobacco products including cigarettes, chewing tobacco, or electronic cigarettes.  If you are pregnant, do not drink alcohol.  If you are breastfeeding, limit how much and how often you drink alcohol.  Limit alcohol intake to no more than 1 drink per day for nonpregnant women. One drink  equals 12 ounces of beer, 5 ounces of wine, or 1 ounces of hard liquor.  Do not use street drugs.  Do not share needles.  Ask your health care provider for help if you need support or information about quitting drugs.  Tell your health care provider if you often feel depressed.  Tell your health care provider if you have ever been abused or do not feel safe at home. This information is not intended to replace advice given to you by your health care provider. Make sure you discuss any questions you have with your health care provider. Document Released: 11/09/2010 Document Revised: 10/02/2015 Document Reviewed: 01/28/2015  2017 Elsevier

## 2016-04-28 NOTE — Assessment & Plan Note (Signed)
Advanced directive discussion - has not set up. Not interested in packet at this time. Would want husband then daughter to be HCPOA.

## 2016-04-28 NOTE — Assessment & Plan Note (Addendum)
Mild, stable off meds.

## 2016-04-28 NOTE — Assessment & Plan Note (Signed)
With R hypertrophic changes. Stable off meds at this time, pt aware to f/u with ortho if needed

## 2016-04-28 NOTE — Assessment & Plan Note (Signed)

## 2016-04-28 NOTE — Progress Notes (Signed)
Pre visit review using our clinic review tool, if applicable. No additional management support is needed unless otherwise documented below in the visit note. 

## 2016-04-28 NOTE — Assessment & Plan Note (Signed)
Chronic, stable. Continue current regimen. 

## 2016-04-28 NOTE — Assessment & Plan Note (Signed)
Stable, followed by Dr Hilarie Fredrickson.

## 2016-04-28 NOTE — Progress Notes (Signed)
BP 140/80   Pulse 88   Temp 98.1 F (36.7 C) (Oral)   Ht _0  (1.549 m)   Wt 148 lb 4 oz (67.2 kg)   BMI 28.01 kg/m    CC: medicare wellness visit Subjective:    Patient ID: Diana Brewer, female    DOB: [birth year], 82 y.o.   MRN: [id number]  HPI: Diana Brewer is a 82 y.o. female presenting on 04/28/2016 for Annual Exam   Established with our office 2014, not seen since. Prior received all primary care from Dr Mare Ferrari until he retired. Now sees Dr Claiborne Billings.   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 In remision on sulfasalazine.   Recently started on toprol XL daily and HCTZ QOD, continues propranolol 28m PRN palpitations/ectopy.   Endorses occasional L sided nose bleed. Many sick contacts at home.   Known chronic arthritis of wrists.   Hearing screen - failed R Vision screen - with eye doctor Fall risk screen - 1, no injury (tripped over pants) Depression screen - passed  Preventative: COLONOSCOPY 2011 ulcerative colitis Breast cancer screening - declines mammogram - would like to age out.  Well woman exam - age out DEXA scan - declines. Reviewed calcium and vitamin D.  Flu shot - declines Tetanus shot - declines Pneumonia shot - declines Shingles shot - declines Advanced directive discussion - has not set up. Not interested in packet at this time. Would want husband then daughter to be HCPOA.  Seat belt use discussed  Sunscreen use discussed. No changing moles on skin. Sees dermatologist.  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 Interim medical history since our last visit reviewed. Allergies and medications reviewed and  updated. Current Outpatient Prescriptions on File Prior to Visit  Medication Sig  . hydrochlorothiazide (MICROZIDE) 12.5 MG capsule TAKE 1 CAPSULE BY MOUTH EVERY OTHER DAY  . metoprolol succinate (TOPROL XL) 25 MG 24 hr tablet Take 1 tablet (25 mg total) by mouth daily.  . Multiple Vitamins-Minerals (CENTRUM SILVER) tablet Take 1 tablet by mouth daily.   . polyethylene glycol (MIRALAX / GLYCOLAX) packet Take 17 g by mouth daily.    . propranolol (INDERAL) 20 MG tablet Take 1 tablet (20 mg total) by mouth as needed. May take an extra tablet by mouth daily as needed for rapid heart rate.  . sulfaSALAzine (AZULFIDINE) 500 MG tablet TAKE 2 TABLETS(1000 MG) BY MOUTH DAILY  . Wheat Dextrin (BENEFIBER PO) Take 1 Dose by mouth daily as needed (constipation).    No current facility-administered medications on file prior to visit.     Review of Systems Per HPI unless specifically indicated in ROS section     Objective:    BP 140/80   Pulse 88   Temp 98.1 F (36.7 C) (Oral)   Ht _1  (1.549 m)   Wt 148 lb 4 oz (67.2 kg)   BMI 28.01 kg/m   Wt Readings from Last 3 Encounters:  04/28/16 148 lb 4 oz (67.2 kg)  02/23/16 148 lb 12.8 oz (67.5 kg)  06/26/15 146 lb 12.8 oz (66.6 kg)    Physical Exam  Constitutional: She is oriented to person, place, and time. She appears well-developed and well-nourished. No distress.  HENT:  Head: Normocephalic and atraumatic.  Right Ear: Hearing, tympanic membrane, external ear and ear canal normal.  Left Ear: Hearing, tympanic membrane, external ear and ear canal normal.  Nose: Nose normal.  Mouth/Throat: Uvula is midline, oropharynx is clear and moist and mucous membranes are normal. No oropharyngeal exudate, posterior oropharyngeal edema or posterior oropharyngeal erythema.  Eyes: Conjunctivae and EOM are normal. Pupils are equal, round, and reactive to light. No scleral icterus.  Neck: Normal range of motion. Neck supple. Carotid bruit is not present. No  thyromegaly present.  Cardiovascular: Normal rate, regular rhythm, normal heart sounds and intact distal pulses.   No murmur heard. Pulses:      Radial pulses are 2+ on the right side, and 2+ on the left side.  No M appreciated  Pulmonary/Chest: Effort normal and breath sounds normal. No respiratory distress. She has no wheezes. She has no rales.  R basilar crackles  Abdominal: Soft. Bowel sounds are normal. She exhibits no distension and no mass. There is no tenderness. There is no rebound and no guarding.  Musculoskeletal: Normal range of motion. She exhibits no edema.  R wrist joint hypertrophy  Lymphadenopathy:    She has no cervical adenopathy.  Neurological: She is alert and oriented to person, place, and time.  CN grossly intact, station and gait intact Recall 3/3 Calculation 4/5 serial 3s  Skin: Skin is warm and dry. No rash noted.  Psychiatric: She has a normal mood and affect. Her behavior is normal. Judgment and thought content normal.  Nursing note and vitals reviewed.  Results for orders placed or performed in visit on 86/76/19  Basic metabolic panel  Result Value Ref Range   Sodium 142 135 - 146 mmol/L   Potassium 4.3 3.5 - 5.3 mmol/L   Chloride 106 98 - 110 mmol/L   CO2 28 20 - 31 mmol/L   Glucose, Bld 85 65 - 99 mg/dL   BUN 17 7 - 25 mg/dL   Creat 1.24 (H) 0.60 - 0.88 mg/dL   Calcium 9.3 8.6 - 10.4 mg/dL  CBC with Differential/Platelet  Result Value Ref Range   WBC 8.9 3.8 - 10.8 K/uL   RBC 3.47 (L) 3.80 - 5.10 MIL/uL   Hemoglobin 11.3 (L) 11.7 - 15.5 g/dL   HCT 33.5 (L) 35.0 - 45.0 %   MCV 96.5 80.0 - 100.0 fL   MCH 32.6 27.0 - 33.0 pg   MCHC 33.7 32.0 - 36.0 g/dL   RDW 13.9 11.0 - 15.0 %   Platelets 318 140 - 400 K/uL   MPV 9.6 7.5 - 12.5 fL   Neutro Abs 5,785 1,500 - 7,800 cells/uL   Lymphs Abs 1,780 850 - 3,900 cells/uL   Monocytes Absolute 979 (H) 200 - 950 cells/uL   Eosinophils Absolute 267 15 - 500 cells/uL   Basophils Absolute 89 0 - 200  cells/uL   Neutrophils Relative % 65 %   Lymphocytes Relative 20 %   Monocytes Relative 11 %   Eosinophils Relative 3 %   Basophils Relative 1 %   Smear Review Criteria for review not met       Assessment & Plan:  Declines preventative healthcare.  Problem List Items Addressed This Visit    Advanced care planning/counseling discussion    Advanced directive discussion - has not set up. Not interested in packet at this time. Would want husband then daughter to be HCPOA.       Benign hypertensive heart disease without heart failure  Chronic, stable. Continue current regimen.       Hypercholesterolemia    Mild, stable off meds.       Medicare annual wellness visit, initial - Primary    I have personally reviewed the Medicare Annual Wellness questionnaire and have noted 1. The patient's medical and social history 2. Their use of alcohol, tobacco or illicit drugs 3. Their current medications and supplements 4. The patient's functional ability including ADL's, fall risks, home safety risks and hearing or visual impairment. Cognitive function has been assessed and addressed as indicated.  5. Diet and physical activity 6. Evidence for depression or mood disorders The patients weight, height, BMI have been recorded in the chart. I have made referrals, counseling and provided education to the patient based on review of the above and I have provided the pt with a written personalized care plan for preventive services. Provider list updated.. See scanned questionairre as needed for further documentation. Reviewed preventative protocols and updated unless pt declined.       ULCERATIVE PROCTITIS    Stable, followed by Dr Hilarie Fredrickson.       Wrist arthritis    With R hypertrophic changes. Stable off meds at this time, pt aware to f/u with ortho if needed          Follow up plan: Return in about 1 year (around 04/28/2017) for medicare wellness visit.  [name], MD

## 2016-05-19 ENCOUNTER — Ambulatory Visit (INDEPENDENT_AMBULATORY_CARE_PROVIDER_SITE_OTHER): Payer: Medicare Other | Admitting: Cardiovascular Disease

## 2016-05-19 VITALS — BP 142/78 | HR 85 | Ht 61.0 in | Wt 151.8 lb

## 2016-05-19 DIAGNOSIS — I119 Hypertensive heart disease without heart failure: Secondary | ICD-10-CM

## 2016-05-19 DIAGNOSIS — R0989 Other specified symptoms and signs involving the circulatory and respiratory systems: Secondary | ICD-10-CM

## 2016-05-19 DIAGNOSIS — N183 Chronic kidney disease, stage 3 unspecified: Secondary | ICD-10-CM

## 2016-05-19 DIAGNOSIS — R002 Palpitations: Secondary | ICD-10-CM | POA: Diagnosis not present

## 2016-05-19 DIAGNOSIS — I341 Nonrheumatic mitral (valve) prolapse: Secondary | ICD-10-CM

## 2016-05-19 MED ORDER — PROPRANOLOL HCL 40 MG PO TABS: 40.0000 mg | ORAL_TABLET | Freq: Two times a day (BID) | ORAL | 3 refills | Status: DC

## 2016-05-19 NOTE — Patient Instructions (Signed)
Your physician has requested that you have a carotid duplex. This test is an ultrasound of the carotid arteries in your neck. It looks at blood flow through these arteries that supply the brain with blood. Allow one hour for this exam. There are no restrictions or special instructions.  Your physician has recommended you make the following change in your medication:   1.) propanolol 40 mg twice a day.  Your physician recommends that you return for lab work in: 6 weeks  Your physician recommends that you schedule a follow-up appointment in: 2 months

## 2016-05-20 ENCOUNTER — Encounter: Payer: Self-pay | Admitting: Cardiovascular Disease

## 2016-05-20 NOTE — Progress Notes (Signed)
Cardiology Office Note    Date:  05/20/2016   ID:  Diana Brewer, DOB August 31, 1933, MRN [id number]  PCP:  [name], MD  Cardiologist:  [name], MD   Chief Complaint  Patient presents with  . Follow-up    pt denied chest pain    History of Present Illness:  Diana Brewer is a 83 y.o. female who has been followed by Dr. Mare Ferrari almost 30 years.  I have taking care of several of her family members. She establish cardiology care with me in October 2017.  She presents for a 3 month follow-up cardiology evaluation.  Diana Brewer has a long-standing history of hypertension, as well as mitral valve prolapse.  She has a history of hyperlipidemia, as well as chronic inflammatory proctitis.  She has been maintained on propranolol 20 mg twice a day for palpitations and at times has to take extra pill for breakthrough ectopy.  She has complained that her weight has been gradually increasing.  Dr. Mare Ferrari had previously checked a TSH.  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  She denies any recent episodes of chest pain.  She denies any change in development of shortness of breath.    When I last saw her, since she was taken propranolol 20 g twice a day, but at times needed additional doses due to recurrent palpitations.  I suggested substituting this for Toprol-XL 25 mg daily with plans to titrate as needed.  However, on the metoprolol preparation she felt that she developed some recurrent migraine headaches, which she had not experienced while she was taken propranolol.  For this reason, she would prefer switching back to propranolol.  She continues to experience occasional palpitations.  She denies PND or orthopnea.  She denies difficulty with sleep.  Her last office visit.  She was given a prescription for HCTZ to take on an as-needed basis for leg swelling.  She has been taking this every other day.  She presents for  evaluation.   Past Medical History:  Diagnosis Date  . 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 History of migraine remote  . History of shingles   . HLD (hyperlipidemia)   . Hypertension   . Macular degeneration    [name]  . MVP (mitral valve prolapse)   . Palpitations   . Postherpetic neuralgia 12/27/2012  . Ulcerative colitis ([place])    left sided - proctosigmoiditis ([name])    Past Surgical History:  Procedure Laterality Date  . BREAST BIOPSY  1975  . COLONOSCOPY  2011   [facility]  . TRACHEOSTOMY  1942   due to Strep Throat (?epiglottitis)    Current Medications: Outpatient Medications Prior to Visit  Medication Sig Dispense Refill  . hydrochlorothiazide (MICROZIDE) 12.5 MG capsule TAKE 1 CAPSULE BY MOUTH EVERY OTHER DAY 45 capsule 0  . metoprolol succinate (TOPROL XL) 25 MG 24 hr tablet Take 1 tablet (25 mg total) by mouth daily. 30 tablet 6  . Multiple Vitamins-Minerals (CENTRUM SILVER) tablet Take 1 tablet by mouth daily.     . polyethylene glycol (MIRALAX / GLYCOLAX) packet Take 17 g by mouth daily.      Marland Kitchen sulfaSALAzine (AZULFIDINE) 500 MG tablet TAKE 2 TABLETS(1000 MG) BY MOUTH DAILY 180 tablet 0  . propranolol (INDERAL) 20 MG tablet Take 1 tablet (20 mg total) by mouth as needed. May take an extra tablet by  mouth daily as needed for rapid heart rate. 180 tablet 3  . Wheat Dextrin (BENEFIBER PO) Take 1 Dose by mouth daily as needed (constipation).      No facility-administered medications prior to visit.      Allergies:   Codeine; Cortisone; and Guaifenesin er   Social History   Social History  . Marital status: Married    Spouse name: N/A  . Number of children: N/A  . Years of education: N/A   Social History Main Topics  . Smoking status: Never Smoker  . Smokeless tobacco: Never Used  . Alcohol use No  . Drug use: No  . Sexual activity: No   Other Topics Concern  . Not on file   Social History Narrative    Lives with husband [name])   Independence child (daughter)   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[name]   Activity: walks and active in yard, grows vegetables   Diet: good water, good fruits/vegetables daily     Family History  family history includes Cancer in her paternal grandfather; Heart disease in her mother; Hypertension in her mother; Melanoma in her brother and father; Rheum arthritis in her father; Stroke in her mother.   ROS General: Negative; No fevers, chills, or night sweats;  HEENT: Negative; No changes in vision or hearing, sinus congestion, difficulty swallowing Pulmonary: Negative; No cough, wheezing, shortness of breath, hemoptysis Cardiovascular: Positive for MVP, hypertension, and intermittent palpitations GI: Negative; No nausea, vomiting, diarrhea, or abdominal pain GU: Negative; No dysuria, hematuria, or difficulty voiding Musculoskeletal: Negative; no myalgias, joint pain, or weakness Hematologic/Oncology: Negative; no easy bruising, bleeding Endocrine: Negative; no heat/cold intolerance; no diabetes Neuro: Negative; no changes in balance, headaches Skin: Negative; No rashes or skin lesions Psychiatric: Negative; No behavioral problems, depression Sleep: Negative; No snoring, daytime sleepiness, hypersomnolence, bruxism, restless legs, hypnogognic hallucinations, no cataplexy Other comprehensive 14 point system review is negative.   PHYSICAL EXAM:   VS:  BP (!) 142/78   Pulse 85   Ht 5' 1"  (1.549 m)   Wt 151 lb 12.8 oz (68.9 kg)   BMI 28.68 kg/m    Wt Readings from Last 3 Encounters:  05/19/16 151 lb 12.8 oz (68.9 kg)  04/28/16 148 lb 4 oz (67.2 kg)  02/23/16 148 lb 12.8 oz (67.5 kg)    General: Alert, oriented, no distress.  Skin: normal turgor, no rashes, warm and dry HEENT: Normocephalic, atraumatic. Pupils equal round and reactive to light; sclera anicteric; extraocular muscles intact; Fundi Without hemorrhages or exudates. Nose without nasal  septal hypertrophy Mouth/Parynx benign; Mallinpatti scale Neck: No JVD, Soft left carotid bruit; normal carotid upstroke Lungs: clear to ausculatation and percussion; no wheezing or rales Chest wall: without tenderness to palpitation Heart: PMI not displaced, RRR, s1 s2 normal, 1/6 systolic murmur, intermittent systolic click;  no diastolic murmur, no rubs, gallops, thrills, or heaves Abdomen: soft, nontender; no hepatosplenomehaly, BS+; abdominal aorta nontender and not dilated by palpation. Back: no CVA tenderness Pulses 2+ Musculoskeletal: full range of motion, normal strength, no joint deformities Extremities: no clubbing cyanosis or edema, Homan's sign negative  Neurologic: grossly nonfocal; Cranial nerves grossly wnl Psychologic: Normal mood and affect   Studies/Labs Reviewed:   ECG (independently read by me): Normal sinus rhythm at 85 bpm.  Nonspecific T changes.  QTc interval 445 Diana.  October 2017 ECG (independently read by me): Normal sinus rhythm at 63 bpm.  No ectopy.  Normal intervals.  Recent Labs: BMP Latest Ref Rng &  Units 03/10/2016 12/09/2015 06/26/2015  Glucose 65 - 99 mg/dL 85 91 86  BUN 7 - 25 mg/dL 17 24 21   Creatinine 0.60 - 0.88 mg/dL 1.24(H) 1.60(H) 1.31(H)  Sodium 135 - 146 mmol/L 142 140 140  Potassium 3.5 - 5.3 mmol/L 4.3 4.8 4.4  Chloride 98 - 110 mmol/L 106 106 105  CO2 20 - 31 mmol/L 28 28 30   Calcium 8.6 - 10.4 mg/dL 9.3 9.0 9.5     Hepatic Function Latest Ref Rng & Units 12/09/2015 06/26/2015 01/03/2015  Total Protein 6.1 - 8.1 g/dL 6.0(L) 6.4 6.6  Albumin 3.6 - 5.1 g/dL 4.0 4.2 4.0  AST 10 - 35 U/L 18 22 22   ALT 6 - 29 U/L 14 17 16   Alk Phosphatase 33 - 130 U/L 64 78 83  Total Bilirubin 0.2 - 1.2 mg/dL 0.4 0.6 0.4  Bilirubin, Direct <=0.2 mg/dL - 0.1 0.0    CBC Latest Ref Rng & Units 03/10/2016 12/09/2015 08/13/2012  WBC 3.8 - 10.8 K/uL 8.9 6.7 6.3  Hemoglobin 11.7 - 15.5 g/dL 11.3(L) 11.7 12.3  Hematocrit 35.0 - 45.0 % 33.5(L) 34.5(L) 40.2    Platelets 140 - 400 K/uL 318 313 -   Lab Results  Component Value Date   MCV 96.5 03/10/2016   MCV 96.6 12/09/2015   MCV 99.0 (A) 08/13/2012   Lab Results  Component Value Date   TSH 3.97 12/09/2015   No results found for: HGBA1C   BNP No results found for: BNP  ProBNP No results found for: PROBNP   Lipid Panel     Component Value Date/Time   CHOL 185 12/09/2015 0758   TRIG 103 12/09/2015 0758   HDL 54 12/09/2015 0758   CHOLHDL 3.4 12/09/2015 0758   VLDL 21 12/09/2015 0758   LDLCALC 110 12/09/2015 0758   LDLDIRECT 132.6 12/27/2012 1146     RADIOLOGY: No results found.   Additional studies/ records that were reviewed today include:  I reviewed previous records by Dr. Mare Ferrari.    ASSESSMENT:    1. Benign hypertensive heart disease without heart failure   2. Rapid palpitations   3. Bruit   4. Stage 3 chronic kidney disease   5. MVP (mitral valve prolapse)      PLAN:  Diana Brewer Is a 83 year old female who has a history of mitral valve prolapse, hypertension, and palpitations.  She had been on long-standing treatment with propranolol and takes this 20 mg twice a day, but at times had to take additional dose due to recurrent palpitations.  She feels that although was easier to take Toprol once a day.  She had 2 episodes of migraine headaches which she had not experienced while she was taking propranolol.  She has a lot of her propranolol pills still left.  At this point, I will switch her back to propranolol but have recommended she increase this to 40 mg twice a day, and in fact she may even need to increase this to 3 times a day.  In the future, we can switch her to long-acting propranolol and some of the medication.  She does have a left carotid bruit.  I am scheduling her for carotid duplex imaging.  I have reviewed her laboratory.  She is anemic.  Her renal function had improved.  I will follow up a CBC and Cmet.  I will see her in the office in 2 months for  cardiology reevaluation.   Medication Adjustments/Labs and Tests Ordered: Current medicines are reviewed at  length with the patient today.  Concerns regarding medicines are outlined above.  Medication changes, Labs and Tests ordered today are listed in the Patient Instructions below. Patient Instructions  Your physician has requested that you have a carotid duplex. This test is an ultrasound of the carotid arteries in your neck. It looks at blood flow through these arteries that supply the brain with blood. Allow one hour for this exam. There are no restrictions or special instructions.  Your physician has recommended you make the following change in your medication:   1.) propanolol 40 mg twice a day.  Your physician recommends that you return for lab work in: 6 weeks  Your physician recommends that you schedule a follow-up appointment in: 2 months      Signed, [name], MD, [facility] 05/20/2016 6:38 PM    [facility] [address] [place], [place], [place]  [address] Phone: [phone]

## 2016-06-06 ENCOUNTER — Other Ambulatory Visit: Payer: Self-pay | Admitting: Internal Medicine

## 2016-06-08 ENCOUNTER — Telehealth: Payer: Self-pay | Admitting: Internal Medicine

## 2016-06-08 MED ORDER — SULFASALAZINE 500 MG PO TABS: ORAL_TABLET | ORAL | 0 refills | Status: DC

## 2016-06-08 NOTE — Telephone Encounter (Signed)
Rx sent 

## 2016-06-10 ENCOUNTER — Telehealth: Payer: Self-pay | Admitting: Family Medicine

## 2016-06-10 NOTE — Telephone Encounter (Signed)
Ok to transfer. Thanks.

## 2016-06-10 NOTE — Telephone Encounter (Signed)
Pt is requesting to transfer care from Dr. Danise Mina to Dr Deborra Medina. Ok to transfer?

## 2016-06-10 NOTE — Telephone Encounter (Signed)
[place] with me if ok with Dr. Darnell Level

## 2016-06-30 ENCOUNTER — Other Ambulatory Visit: Payer: Medicare Other | Admitting: *Deleted

## 2016-06-30 ENCOUNTER — Other Ambulatory Visit: Payer: Self-pay | Admitting: Nurse Practitioner

## 2016-06-30 DIAGNOSIS — R002 Palpitations: Secondary | ICD-10-CM | POA: Diagnosis not present

## 2016-06-30 LAB — COMPREHENSIVE METABOLIC PANEL
ALK PHOS: 77 IU/L (ref 39–117)
ALT: 18 IU/L (ref 0–32)
AST: 21 IU/L (ref 0–40)
Albumin/Globulin Ratio: 2.1 (ref 1.2–2.2)
Albumin: 4.1 g/dL (ref 3.5–4.7)
BILIRUBIN TOTAL: 0.3 mg/dL (ref 0.0–1.2)
BUN/Creatinine Ratio: 15 (ref 12–28)
BUN: 21 mg/dL (ref 8–27)
CHLORIDE: 102 mmol/L (ref 96–106)
CO2: 26 mmol/L (ref 18–29)
CREATININE: 1.37 mg/dL — AB (ref 0.57–1.00)
Calcium: 9.4 mg/dL (ref 8.7–10.3)
GFR calc Af Amer: 42 — ABNORMAL LOW (ref >59)
GFR calc non Af Amer: 36 — ABNORMAL LOW (ref >59)
GLUCOSE: 88 mg/dL (ref 65–99)
Globulin, Total: 2 (ref 1.5–4.5)
Potassium: 4.7 mmol/L (ref 3.5–5.2)
Sodium: 142 mmol/L (ref 134–144)
Total Protein: 6.1 g/dL (ref 6.0–8.5)

## 2016-06-30 LAB — CBC
HEMOGLOBIN: 11.2 g/dL (ref 11.1–15.9)
Hematocrit: 33.4 % — ABNORMAL LOW (ref 34.0–46.6)
MCH: 32.7 pg (ref 26.6–33.0)
MCHC: 33.5 g/dL (ref 31.5–35.7)
MCV: 97 fL (ref 79–97)
Platelets: 332 10*3/uL (ref 150–379)
RBC: 3.43 x10E6/uL — AB (ref 3.77–5.28)
RDW: 13.9 % (ref 12.3–15.4)
WBC: 7.2 10*3/uL (ref 3.4–10.8)

## 2016-07-04 ENCOUNTER — Other Ambulatory Visit: Payer: Self-pay | Admitting: Cardiovascular Disease

## 2016-07-05 ENCOUNTER — Encounter: Payer: Self-pay | Admitting: *Deleted

## 2016-07-05 ENCOUNTER — Telehealth: Payer: Self-pay

## 2016-07-05 NOTE — Telephone Encounter (Signed)
Pt left v/m requesting refill on HCTZ. I spoke with pt and she has already requested refill from Dr Claiborne Billings who has filled med before; pt said nothing further needed.

## 2016-07-05 NOTE — Telephone Encounter (Signed)
New message     *STAT* If patient is at the pharmacy, call can be transferred to refill team.   1. Which medications need to be refilled? (please list name of each medication and dose if known) hydrochlorothiazide (MICROZIDE) 12.5 MG capsule 2. Which pharmacy/location (including street and city if local pharmacy) is medication to be sent to? [facility] [phone]  3. Do they need a 30 day or 90 day supply? 90 day supply

## 2016-07-09 ENCOUNTER — Telehealth: Payer: Self-pay | Admitting: Cardiovascular Disease

## 2016-07-09 MED ORDER — HYDROCHLOROTHIAZIDE 12.5 MG PO CAPS: 12.5000 mg | ORAL_CAPSULE | ORAL | 3 refills | Status: DC

## 2016-07-09 NOTE — Telephone Encounter (Signed)
Rx(s) sent to pharmacy electronically.  

## 2016-07-09 NOTE — Telephone Encounter (Signed)
New message   Pt is calling because she is out of medication.  *STAT* If patient is at the pharmacy, call can be transferred to refill team.   1. Which medications need to be refilled? (please list name of each medication and dose if known) hydrochlorothiazide 12.5 mg  2. Which pharmacy/location (including street and city if local pharmacy) is medication to be sent to? [facility].   3. Do they need a 30 day or 90 day supply? 90 day

## 2016-07-16 DIAGNOSIS — H353131 Nonexudative age-related macular degeneration, bilateral, early dry stage: Secondary | ICD-10-CM | POA: Diagnosis not present

## 2016-07-20 ENCOUNTER — Inpatient Hospital Stay (HOSPITAL_COMMUNITY): Admission: RE | Admit: 2016-07-20 | Payer: Medicare Other | Source: Ambulatory Visit

## 2016-07-21 ENCOUNTER — Ambulatory Visit (HOSPITAL_COMMUNITY)
Admission: RE | Admit: 2016-07-21 | Discharge: 2016-07-21 | Disposition: A | Discharge status: Home / Self Care | Payer: Medicare Other | Source: Ambulatory Visit | Attending: Cardiovascular Disease | Admitting: Cardiovascular Disease

## 2016-07-21 DIAGNOSIS — I6523 Occlusion and stenosis of bilateral carotid arteries: Secondary | ICD-10-CM | POA: Insufficient documentation

## 2016-07-21 DIAGNOSIS — R0989 Other specified symptoms and signs involving the circulatory and respiratory systems: Secondary | ICD-10-CM

## 2016-07-23 ENCOUNTER — Ambulatory Visit (INDEPENDENT_AMBULATORY_CARE_PROVIDER_SITE_OTHER): Payer: Medicare Other | Admitting: Cardiovascular Disease

## 2016-07-23 ENCOUNTER — Encounter: Payer: Self-pay | Admitting: Cardiovascular Disease

## 2016-07-23 VITALS — BP 138/70 | HR 62 | Ht 61.0 in | Wt 143.8 lb

## 2016-07-23 DIAGNOSIS — I341 Nonrheumatic mitral (valve) prolapse: Secondary | ICD-10-CM

## 2016-07-23 DIAGNOSIS — N183 Chronic kidney disease, stage 3 unspecified: Secondary | ICD-10-CM

## 2016-07-23 DIAGNOSIS — E7800 Pure hypercholesterolemia, unspecified: Secondary | ICD-10-CM

## 2016-07-23 DIAGNOSIS — I6523 Occlusion and stenosis of bilateral carotid arteries: Secondary | ICD-10-CM

## 2016-07-23 DIAGNOSIS — I119 Hypertensive heart disease without heart failure: Secondary | ICD-10-CM

## 2016-07-23 MED ORDER — EZETIMIBE 10 MG PO TABS: 10.0000 mg | ORAL_TABLET | Freq: Every day | ORAL | 6 refills | Status: DC

## 2016-07-23 NOTE — Patient Instructions (Signed)
Your physician has recommended you make the following change in your medication:   1.) start new prescription for generic zetia. This has been sent to your pharmacy.   Your physician recommends that you return for fasting lab work in: 3 months after starting Zetia.   Your physician wants you to follow-up in: 6 months or sooner if needed. You will receive a reminder letter in the mail two months in advance. If you don't receive a letter, please call our office to schedule the follow-up appointment.

## 2016-07-23 NOTE — Progress Notes (Signed)
Cardiology Office Note    Date:  07/24/2016   ID:  Diana Brewer, DOB Aug 10, 1933, MRN [id number]  PCP:  Diana Norris, MD  Cardiologist:  Diana Majestic, MD   Chief Complaint  Patient presents with  . Follow-up    History of Present Illness:  Diana Brewer is a 83 y.o. female who has been followed by Dr. Mare Brewer almost 30 years.  I have cared for several of her family members. She established cardiology care with me in October 2017 and I last saw her in January 2018. .  She presents for a 2 month follow-up cardiology evaluation.  Diana.Brewer has a long-standing history of hypertension, as well as mitral valve prolapse.  She has a history of hyperlipidemia, as well as chronic inflammatory proctitis.  She has been maintained on propranolol 20 mg twice a day for palpitations and at times has to take extra pill for breakthrough ectopy.  She has complained that her weight has been gradually increasing.  Dr. Mare Brewer had previously checked a TSH.  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  She denies any recent episodes of chest pain.  She denies any change in development of shortness of breath.    When I  saw her, since she was taken propranolol 20 mg twice a day, but at times needed additional doses due to recurrent palpitations  I suggested substituting this for Toprol-XL 25 mg daily with plans to titrate as needed.  However, on the metoprolol preparation she felt that she developed some recurrent migraine headaches, which she had not experienced while she was taken propranolol.  She was switched back to propanolol 40 mg bid which has beneficial with reference to her migraine headaches on this increased beta blocker dose.  Her palpitations also have improved. When  I initially evaluated her, she had a soft carotid bruit on physical exam.  Since I last saw her, she underwent carotid duplex imaging on 07/21/2016.  This revealed  mild heterogenous plaque bilaterally with narrowings in the 1-39% range.  She had normal subclavian and vertebral arteries bilaterally.  Laboratory in August 2017 had shown an LDL cholesterol of 110 and  in February 2017 LDL was 138.  She denies any chest tightness.  She denies recent palpitations.  She presents for reevaluation.   Past Medical History:  Diagnosis Date  . Arthritis 2016   R wrist predominantly radius scaphoid and midcarpal Diana Brewer)  . History of migraine remote  . History of shingles   . HLD (hyperlipidemia)   . Hypertension   . Macular degeneration    Diana Brewer  . Diana (mitral valve prolapse)   . Palpitations   . Postherpetic neuralgia 12/27/2012  . Ulcerative colitis ([name])    left sided - proctosigmoiditis (Dr. Sharlett Brewer)    Past Surgical History:  Procedure Laterality Date  . BREAST BIOPSY  1975  . COLONOSCOPY  2011   [facility]  . TRACHEOSTOMY  1942   due to Strep Throat (?epiglottitis)    Current Medications: Outpatient Medications Prior to Visit  Medication Sig Dispense Refill  . hydrochlorothiazide (MICROZIDE) 12.5 MG capsule Take 1 capsule (12.5 mg total) by mouth every other day. 45 capsule 3  . Multiple Vitamins-Minerals (CENTRUM SILVER) tablet Take 1 tablet by mouth daily.     . polyethylene glycol (MIRALAX / GLYCOLAX) packet Take 17 g by mouth daily.      . propranolol (INDERAL) 40 MG tablet Take  1 tablet (40 mg total) by mouth 2 (two) times daily. 60 tablet 3  . sulfaSALAzine (AZULFIDINE) 500 MG tablet TAKE 2 TABLETS(1000 MG) BY MOUTH DAILY 180 tablet 0  . metoprolol succinate (TOPROL XL) 25 MG 24 hr tablet Take 1 tablet (25 mg total) by mouth daily. (Patient not taking: Reported on 07/23/2016) 30 tablet 6   No facility-administered medications prior to visit.      Allergies:   Codeine; Cortisone; and Guaifenesin er   Social History   Social History  . Marital status: Married    Spouse name: N/A  . Number of children: N/A  . Years of education:  N/A   Social History Main Topics  . Smoking status: Never Smoker  . Smokeless tobacco: Never Used  . Alcohol use No  . Drug use: No  . Sexual activity: No   Other Topics Concern  . None   Social History Narrative   Lives with husband Diana Brewer)   Diana Brewer (daughter)   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[facility]   Activity: walks and active in yard, grows vegetables   Diet: good water, good fruits/vegetables daily     Family History  family history includes Cancer in her paternal grandfather; Heart disease in her mother; Hypertension in her mother; Melanoma in her brother and father; Rheum arthritis in her father; Stroke in her mother.   ROS General: Negative; No fevers, chills, or night sweats;  HEENT: Negative; No changes in vision or hearing, sinus congestion, difficulty swallowing Pulmonary: Negative; No cough, wheezing, shortness of breath, hemoptysis Cardiovascular: Positive for Diana, hypertension, and intermittent palpitations GI: Negative; No nausea, vomiting, diarrhea, or abdominal pain GU: Negative; No dysuria, hematuria, or difficulty voiding Musculoskeletal: Negative; no myalgias, joint pain, or weakness Hematologic/Oncology: Negative; no easy bruising, bleeding Endocrine: Negative; no heat/cold intolerance; no diabetes Neuro: Negative; no changes in balance, headaches Skin: Negative; No rashes or skin lesions Psychiatric: Negative; No behavioral problems, depression Sleep: Negative; No snoring, daytime sleepiness, hypersomnolence, bruxism, restless legs, hypnogognic hallucinations, no cataplexy Other comprehensive 14 point system review is negative.   PHYSICAL EXAM:   VS:  BP 138/70 (BP Location: Right Arm, Patient Position: Sitting, Cuff Size: Normal)   Pulse 62   Ht _0  (1.549 m)   Wt 143 lb 12.8 oz (65.2 kg)   BMI 27.17 kg/m     Repeat BP 120/76  Wt Readings from Last 3 Encounters:  07/23/16 143 lb 12.8 oz (65.2 kg)  05/19/16 151 lb 12.8 oz  (68.9 kg)  04/28/16 148 lb 4 oz (67.2 kg)    General: Alert, oriented, no distress.  Skin: normal turgor, no rashes, warm and dry HEENT: Normocephalic, atraumatic. Pupils equal round and reactive to light; sclera anicteric; extraocular muscles intact; Fundi Without hemorrhages or exudates. Nose without nasal septal hypertrophy Mouth/Parynx benign; Mallinpatti scale Neck: No JVD, Soft left carotid bruit; normal carotid upstroke Lungs: clear to ausculatation and percussion; no wheezing or rales Chest wall: without tenderness to palpitation Heart: PMI not displaced, RRR, s1 s2 normal, 1/6 systolic murmur, intermittent systolic click;  no diastolic murmur, no rubs, gallops, thrills, or heaves Abdomen: soft, nontender; no hepatosplenomehaly, BS+; abdominal aorta nontender and not dilated by palpation. Back: no CVA tenderness Pulses 2+ Musculoskeletal: full range of motion, normal strength, no joint deformities Extremities: no clubbing cyanosis or edema, Homan's sign negative  Neurologic: grossly nonfocal; Cranial nerves grossly wnl Psychologic: Normal mood and affect   Studies/Labs Reviewed:   ECG (independently read by  me): NSR at 80; Normal intervals. QTc 405 msec. No ST changes.  January 2018ECG (independently read by me): Normal sinus rhythm at 85 bpm.  Nonspecific T changes.  QTc interval 445 Diana.  October 2017 ECG (independently read by me): Normal sinus rhythm at 63 bpm.  No ectopy.  Normal intervals.  Recent Labs: BMP Latest Ref Rng & Units 06/30/2016 03/10/2016 12/09/2015  Glucose 65 - 99 mg/dL 88 85 91  BUN 8 - 27 mg/dL _0 Creatinine 0.57 - 1.00 mg/dL 1.37(H) 1.24(H) 1.60(H)  BUN/Creat Ratio 12 - 28 15 - -  Sodium 134 - 144 mmol/L 142 142 140  Potassium 3.5 - 5.2 mmol/L 4.7 4.3 4.8  Chloride 96 - 106 mmol/L 102 106 106  CO2 18 - 29 mmol/L _1 Calcium 8.7 - 10.3 mg/dL 9.4 9.3 9.0     Hepatic Function Latest Ref Rng & Units 06/30/2016 12/09/2015 06/26/2015  Total  Protein 6.0 - 8.5 g/dL 6.1 6.0(L) 6.4  Albumin 3.5 - 4.7 g/dL 4.1 4.0 4.2  AST 0 - 40 IU/L _2 ALT 0 - 32 IU/L _3 Alk Phosphatase 39 - 117 IU/L 77 64 78  Total Bilirubin 0.0 - 1.2 mg/dL 0.3 0.4 0.6  Bilirubin, Direct <=0.2 mg/dL - - 0.1    CBC Latest Ref Rng & Units 06/30/2016 03/10/2016 12/09/2015  WBC 3.4 - 10.8 x10E3/uL 7.2 8.9 6.7  Hemoglobin 11.7 - 15.5 g/dL - 11.3(L) 11.7  Hematocrit 34.0 - 46.6 % 33.4(L) 33.5(L) 34.5(L)  Platelets 150 - 379 x10E3/uL 332 318 313   Lab Results  Component Value Date   MCV 97 06/30/2016   MCV 96.5 03/10/2016   MCV 96.6 12/09/2015   Lab Results  Component Value Date   TSH 3.97 12/09/2015   No results found for: HGBA1C   BNP No results found for: BNP  ProBNP No results found for: PROBNP   Lipid Panel     Component Value Date/Time   CHOL 185 12/09/2015 0758   TRIG 103 12/09/2015 0758   HDL 54 12/09/2015 0758   CHOLHDL 3.4 12/09/2015 0758   VLDL 21 12/09/2015 0758   LDLCALC 110 12/09/2015 0758   LDLDIRECT 132.6 12/27/2012 1146     RADIOLOGY: No results found.   Additional studies/ records that were reviewed today include:  I reviewed previous records by Dr. Mare Brewer.    ASSESSMENT:    1. Benign hypertensive heart disease without heart failure   2. Hypercholesterolemia   3. Carotid artery plaque, bilateral   4. Diana (mitral valve prolapse)   5. Stage 3 chronic kidney disease      PLAN:  Diana Devincenzi is an 83 year old female who has a history of mitral valve prolapse, hypertension, and palpitations.  She had been on long-standing treatment with propranolol low-dose 20 mg twice a day but had developed recurrent palpitations.  She did not tolerate the substitution to Toprol due to exacerbation of her migraine.  Her palpitations have resolved as well and her migraines have not recurred with resumption of an increased propranolol at 40 mg twice a day.  I reviewed with her recent carotid duplex evaluation.  This  suggests mild plaque bilaterally in her carotids.  With her subclinical atherosclerosis, I have recommended initiation of lipid-lowering therapy in attempt to obtain a target LDL of less than 70.  I also reviewed with her recent subclinical atherosclerosis data on asymptomatic patients without cardiac  risk factors and atherosclerosis was  found to develop with LDLs beginning at 60.  She is hesitant to try statins.  I will initiate therapy with Zetia 10 mg and plan to follow-up laboratory in 3 months for you continue to take her current dose of propranolol.  In the future may be possible to switch this to a long-acting preparation.  Renal function has improved from a creatinine of 1.6 to1.37.  I will see her in 6 months for re-evaluation.   Medication Adjustments/Labs and Tests Ordered: Current medicines are reviewed at length with the patient today.  Concerns regarding medicines are outlined above.  Medication changes, Labs and Tests ordered today are listed in the Patient Instructions below. Patient Instructions  Your physician has recommended you make the following change in your medication:   1.) start new prescription for generic zetia. This has been sent to your pharmacy.   Your physician recommends that you return for fasting lab work in: 3 months after starting Zetia.   Your physician wants you to follow-up in: 6 months or sooner if needed. You will receive a reminder letter in the mail two months in advance. If you don't receive a letter, please call our office to schedule the follow-up appointment.     Signed, Diana Majestic, MD, [facility] 07/24/2016 12:15 PM    [place] [address], [place], [place], [place]  [address] Phone: [phone]

## 2016-07-26 ENCOUNTER — Ambulatory Visit (INDEPENDENT_AMBULATORY_CARE_PROVIDER_SITE_OTHER): Payer: Medicare Other | Admitting: Internal Medicine

## 2016-07-26 ENCOUNTER — Encounter: Payer: Self-pay | Admitting: Internal Medicine

## 2016-07-26 VITALS — BP 126/72 | HR 74 | Ht 62.0 in | Wt 144.2 lb

## 2016-07-26 DIAGNOSIS — D649 Anemia, unspecified: Secondary | ICD-10-CM

## 2016-07-26 DIAGNOSIS — I6523 Occlusion and stenosis of bilateral carotid arteries: Secondary | ICD-10-CM

## 2016-07-26 DIAGNOSIS — K5909 Other constipation: Secondary | ICD-10-CM

## 2016-07-26 DIAGNOSIS — K5130 Ulcerative (chronic) rectosigmoiditis without complications: Secondary | ICD-10-CM | POA: Diagnosis not present

## 2016-07-26 MED ORDER — POLYETHYLENE GLYCOL 3350 17 GM/SCOOP PO POWD: ORAL | 3 refills | Status: DC

## 2016-07-26 MED ORDER — SULFASALAZINE 500 MG PO TABS: ORAL_TABLET | ORAL | 0 refills | Status: DC

## 2016-07-26 NOTE — Patient Instructions (Signed)
If you are age 81 or older, your body mass index should be between 23-30. Your Body mass index is 26.38 kg/m. If this is out of the aforementioned range listed, please consider follow up with your Primary Care Provider.  If you are age 55 or younger, your body mass index should be between 19-25. Your Body mass index is 26.38 kg/m. If this is out of the aformentioned range listed, please consider follow up with your Primary Care Provider.   We have sent the following medications to your pharmacy for you to pick up at your convenience: Sulfasalazine Miralax  Follow up in one year. Thank you for choosing me and [facility].  Dr. Billie Lade

## 2016-07-26 NOTE — Progress Notes (Signed)
Subjective:    Patient ID: Diana Brewer, female    DOB: 1933-05-13, 83 y.o.   MRN: [id number]  HPI Silvano Rusk is an 83 year old female with a history of distal proctosigmoiditis, hyperlipidemia, mitral valve prolapse and hypertension who is here for follow-up. She was last seen a little over one year ago. She's been maintained on sulfasalazine 1000 mg daily. She takes half dose MiraLAX for mild constipation most days of the week.  She's had no recent symptoms of colitis. Bowel movements have been regular and soft with MiraLAX. Occurring usually once per day. No blood in her stool or melena. No abdominal pain. Her tenesmus. No rectal or anorectal pain. She has not been able to lose weight which bothers her despite being active. She works in her garden and they grow plants including fruits and vegetables for the [employer]. She also is actively involved with her daughter and grandchildren. Appetite has been good. She denies dysphagia and odynophagia. No recent issues with heartburn or indigestion.  Last colonoscopy was in 2011 perform a [name]. Mild mucosal changes to 10 cm which was biopsied. Otherwise normal colon with the exception of mild diverticulosis in the sigmoid. Biopsy showed minimally active chronic colitis.  Review of Systems As per history of present illness, otherwise negative  Current Medications, Allergies, Past Medical History, Past Surgical History, Family History and Social History were reviewed in [employer] record.      Objective:   Physical Exam BP 126/72   Pulse 74   Ht 5' 2"  (1.575 m)   Wt 144 lb 4 oz (65.4 kg)   BMI 26.38 kg/m  Constitutional: Well-developed and well-nourished. No distress. HEENT: Normocephalic and atraumatic. Oropharynx is clear and moist. No oropharyngeal exudate. Conjunctivae are normal.  No scleral icterus. Neck: Neck supple. Trachea midline. Cardiovascular: Normal rate, regular rhythm and  intact distal pulses. No M/R/G Pulmonary/chest: Effort normal and breath sounds normal. No wheezing, rales or rhonchi. Abdominal: Soft, nontender, nondistended. Bowel sounds active throughout. There are no masses palpable. No hepatosplenomegaly. Extremities: no clubbing, cyanosis, or edema Neurological: Alert and oriented to person place and time. Skin: Skin is warm and dry.  Psychiatric: Normal mood and affect. Behavior is normal.  CBC    Component Value Date/Time   WBC 7.2 06/30/2016 0808   WBC 8.9 03/10/2016 0814   RBC 3.43 (L) 06/30/2016 0808   RBC 3.47 (L) 03/10/2016 0814   HGB 11.3 (L) 03/10/2016 0814   HCT 33.4 (L) 06/30/2016 0808   PLT 332 06/30/2016 0808   MCV 97 06/30/2016 0808   MCH 32.7 06/30/2016 0808   MCH 32.6 03/10/2016 0814   MCHC 33.5 06/30/2016 0808   MCHC 33.7 03/10/2016 0814   RDW 13.9 06/30/2016 0808   LYMPHSABS 1,780 03/10/2016 0814   MONOABS 979 (H) 03/10/2016 0814   EOSABS 267 03/10/2016 0814   BASOSABS 89 03/10/2016 0814   CMP     Component Value Date/Time   NA 142 06/30/2016 0808   K 4.7 06/30/2016 0808   CL 102 06/30/2016 0808   CO2 26 06/30/2016 0808   GLUCOSE 88 06/30/2016 0808   GLUCOSE 85 03/10/2016 0814   BUN 21 06/30/2016 0808   CREATININE 1.37 (H) 06/30/2016 0808   CREATININE 1.24 (H) 03/10/2016 0814   CALCIUM 9.4 06/30/2016 0808   PROT 6.1 06/30/2016 0808   ALBUMIN 4.1 06/30/2016 0808   AST 21 06/30/2016 0808   ALT 18 06/30/2016 0808   ALKPHOS 77 06/30/2016  6219   BILITOT 0.3 06/30/2016 0808   GFRNONAA 36 (L) 06/30/2016 0808   GFRAA 42 (L) 06/30/2016 0808      Assessment & Plan:  83 year old female with a history of distal proctosigmoiditis, hyperlipidemia, mitral valve prolapse and hypertension who is here for follow-up.  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 She maintains clinical remission and is doing well and sulfasalazine. We discussed surveillance colonoscopy but she  continues to not wish to pursue this. This is understandable at 83 years old particularly in light of clinical remission. Tolerating sulfasalazine and we will continue current dose 1000 mg daily.  2. Mild chronic constipation -- doing well with MiraLAX at half dose. Continue this daily to every other day.  3. Anemia, normocytic -- mild and stable. Will follow for now.  No evidence of visible blood loss or blood in stool.  Annual follow-up, sooner if necessary

## 2016-08-11 ENCOUNTER — Encounter: Payer: Self-pay | Admitting: *Deleted

## 2016-09-20 ENCOUNTER — Telehealth: Payer: Self-pay | Admitting: Cardiovascular Disease

## 2016-09-20 NOTE — Telephone Encounter (Signed)
New message       Pt was told that her carotid ultrasound was normal.  She then received a letter from [facility] stating that she had mild plaque and no changes are needed.  Dr Claiborne Billings recently gave pt a presc for cholesterol medication.  She is confused on whether she still need medication since her test was normal.  She did not get the presc filled.  Please call

## 2016-09-20 NOTE — Telephone Encounter (Signed)
Left message for pt to CB-start zetia and fu lab in 3 months  Per OV dated 07-23-16: With her subclinical atherosclerosis, I have recommended initiation of lipid-lowering therapy in attempt to obtain a target LDL of less than 70. She is hesitant to try statins.  I will initiate therapy with Zetia 10 mg and plan to follow-up laboratory in 3 months for you continue to take her current dose of propranolol.

## 2016-09-21 NOTE — Telephone Encounter (Signed)
Spoke with pt she states that she read some paper with the side effects from Zetia and states that she does not want to take medication. I informed pt that she should try medication because she has never taken lipid lowering agent before and I convinced her to try medication as ordered she will take Zetia and call back if she is having any side effects once starting.

## 2016-10-04 NOTE — Telephone Encounter (Signed)
I would encourage a trial of Zetia.  Zetia has very minimal side effects.  It is not a statin.  It usually is exceptionally well tolerated.

## 2016-10-06 NOTE — Telephone Encounter (Signed)
SHE HAS BEEN TAKING THIS FOR 2 WEEKS ALREADY

## 2016-10-06 NOTE — Telephone Encounter (Signed)
Please have her decrease Zetia to every other day, then follow plan as previously written.

## 2016-10-06 NOTE — Telephone Encounter (Signed)
PT NOTIFIED. She will continue to take medication every other day then continue daily

## 2016-10-06 NOTE — Telephone Encounter (Signed)
Noted (per [facility] noted):  " mild plaque bilaterally in her carotids.  With her subclinical atherosclerosis, I have recommended initiation of lipid-lowering therapy in attempt to obtain a target LDL of less than 70."  Recommendation:  **Diziness is NOT a common side effect of Zetia (potential for less than 1% of population).**  1.  Try easing into daily dosing by taking zetia every other day for 1 week, then increase to Zetia 15m daily to imporve tolerability.  2. Take medication in the evenings (after supper or close to bedtime)

## 2016-10-06 NOTE — Telephone Encounter (Signed)
Spoke with Diana Brewer she states that she has been taking medication for 2 weeks @[facility] . Diana Brewer reports that yesterday (10-05-16) she woke up with dizziness and Diana Brewer stated that she did not take Zetia last night and states that dizziness is better today. Do you think that dizziness is related to Zetia? Please advise

## 2016-10-11 NOTE — Telephone Encounter (Signed)
acknowledged

## 2016-11-29 ENCOUNTER — Other Ambulatory Visit: Payer: Self-pay | Admitting: Internal Medicine

## 2016-12-28 ENCOUNTER — Other Ambulatory Visit: Payer: Medicare Other | Admitting: *Deleted

## 2016-12-28 ENCOUNTER — Other Ambulatory Visit: Payer: Self-pay

## 2016-12-28 DIAGNOSIS — I119 Hypertensive heart disease without heart failure: Secondary | ICD-10-CM

## 2016-12-28 DIAGNOSIS — E7800 Pure hypercholesterolemia, unspecified: Secondary | ICD-10-CM | POA: Diagnosis not present

## 2016-12-28 LAB — LIPID PANEL W/O CHOL/HDL RATIO
Cholesterol, Total: 143 mg/dL (ref 100–199)
HDL: 52 mg/dL (ref >39)
LDL CALC: 72 mg/dL (ref 0–99)
Triglycerides: 96 mg/dL (ref 0–149)
VLDL Cholesterol Cal: 19 mg/dL (ref 5–40)

## 2016-12-28 LAB — CBC WITH DIFFERENTIAL/PLATELET
BASOS ABS: 0.1 10*3/uL (ref 0.0–0.2)
Basos: 2 %
EOS (ABSOLUTE): 0.2 10*3/uL (ref 0.0–0.4)
EOS: 4 %
HEMATOCRIT: 32.5 % — AB (ref 34.0–46.6)
Hemoglobin: 10.9 g/dL — ABNORMAL LOW (ref 11.1–15.9)
IMMATURE GRANULOCYTES: 0 %
Immature Grans (Abs): 0 10*3/uL (ref 0.0–0.1)
Lymphocytes Absolute: 1.8 10*3/uL (ref 0.7–3.1)
Lymphs: 26 %
MCH: 32.4 pg (ref 26.6–33.0)
MCHC: 33.5 g/dL (ref 31.5–35.7)
MCV: 97 fL (ref 79–97)
MONOCYTES: 13 %
MONOS ABS: 0.9 10*3/uL (ref 0.1–0.9)
NEUTROS PCT: 55 %
Neutrophils Absolute: 3.7 10*3/uL (ref 1.4–7.0)
Platelets: 314 10*3/uL (ref 150–379)
RBC: 3.36 x10E6/uL — AB (ref 3.77–5.28)
RDW: 13.4 % (ref 12.3–15.4)
WBC: 6.7 10*3/uL (ref 3.4–10.8)

## 2016-12-28 LAB — COMPREHENSIVE METABOLIC PANEL
ALT: 17 IU/L (ref 0–32)
AST: 20 IU/L (ref 0–40)
Albumin/Globulin Ratio: 2.1 (ref 1.2–2.2)
Albumin: 4 g/dL (ref 3.5–4.7)
Alkaline Phosphatase: 75 IU/L (ref 39–117)
BUN/Creatinine Ratio: 15 (ref 12–28)
BUN: 18 mg/dL (ref 8–27)
Bilirubin Total: 0.2 mg/dL (ref 0.0–1.2)
CALCIUM: 9.2 mg/dL (ref 8.7–10.3)
CO2: 26 mmol/L (ref 20–29)
CREATININE: 1.24 mg/dL — AB (ref 0.57–1.00)
Chloride: 103 mmol/L (ref 96–106)
GFR, EST AFRICAN AMERICAN: 47 mL/min/{1.73_m2} — AB (ref >59)
GFR, EST NON AFRICAN AMERICAN: 41 mL/min/{1.73_m2} — AB (ref >59)
GLUCOSE: 90 mg/dL (ref 65–99)
Globulin, Total: 1.9 g/dL (ref 1.5–4.5)
Potassium: 4.5 mmol/L (ref 3.5–5.2)
Sodium: 143 mmol/L (ref 134–144)
TOTAL PROTEIN: 5.9 g/dL — AB (ref 6.0–8.5)

## 2016-12-30 ENCOUNTER — Telehealth: Payer: Self-pay | Admitting: Cardiovascular Disease

## 2016-12-30 NOTE — Telephone Encounter (Signed)
Patient made aware of results and verbalized her understanding:  Notes recorded by Troy Sine, MD on 12/29/2016 at 2:53 PM EDT Anemia persists; lipid studies are good; creatinine is slightly improved

## 2016-12-30 NOTE — Telephone Encounter (Signed)
New message    Pt is calling about her lab results. Please call.

## 2016-12-31 ENCOUNTER — Ambulatory Visit (INDEPENDENT_AMBULATORY_CARE_PROVIDER_SITE_OTHER): Payer: Medicare Other | Admitting: Ophthalmology

## 2017-01-18 DIAGNOSIS — H353131 Nonexudative age-related macular degeneration, bilateral, early dry stage: Secondary | ICD-10-CM | POA: Diagnosis not present

## 2017-02-09 ENCOUNTER — Ambulatory Visit (INDEPENDENT_AMBULATORY_CARE_PROVIDER_SITE_OTHER): Payer: Medicare Other | Admitting: Cardiovascular Disease

## 2017-02-09 ENCOUNTER — Encounter: Payer: Self-pay | Admitting: Cardiovascular Disease

## 2017-02-09 VITALS — BP 134/82 | HR 66 | Ht 61.0 in | Wt 147.6 lb

## 2017-02-09 DIAGNOSIS — I6523 Occlusion and stenosis of bilateral carotid arteries: Secondary | ICD-10-CM

## 2017-02-09 DIAGNOSIS — E785 Hyperlipidemia, unspecified: Secondary | ICD-10-CM | POA: Diagnosis not present

## 2017-02-09 DIAGNOSIS — N183 Chronic kidney disease, stage 3 unspecified: Secondary | ICD-10-CM

## 2017-02-09 DIAGNOSIS — I341 Nonrheumatic mitral (valve) prolapse: Secondary | ICD-10-CM | POA: Diagnosis not present

## 2017-02-09 DIAGNOSIS — I10 Essential (primary) hypertension: Secondary | ICD-10-CM | POA: Diagnosis not present

## 2017-02-09 MED ORDER — HYDROCHLOROTHIAZIDE 12.5 MG PO CAPS: 12.5000 mg | ORAL_CAPSULE | Freq: Every day | ORAL | 3 refills | Status: DC

## 2017-02-09 NOTE — Progress Notes (Addendum)
Cardiology Office Note    Date:  02/09/2017   ID:  Diana Brewer, DOB [birth year], MRN [id number]  PCP:  Diana Passy, MD  Cardiologist:  Diana Majestic, MD   No chief complaint on file.   History of Present Illness:  Diana Brewer is a 83 y.o. female who has been followed by Dr. Mare Brewer almost 30 years.  I have cared for several of her family members. She established cardiology care with me in October 2017.  She presents for a 7 month follow-up cardiology evaluation.  Diana Brewer has a long-standing history of hypertension, as well as mitral valve prolapse.  She has a history of hyperlipidemia, as well as chronic inflammatory proctitis.  She has been maintained on propranolol 20 mg twice a day for palpitations and at times has to take extra pill for breakthrough ectopy.  She has complained that her weight has been gradually increasing.  Dr. Mare Brewer had previously checked a TSH.  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  She denies any recent episodes of chest pain.  She denies any change in development of shortness of breath.    When I  saw her, since she was taken propranolol 20 mg twice a day, but at times needed additional doses due to recurrent palpitations  I suggested substituting this for Toprol-XL 25 mg daily with plans to titrate as needed.  However, on the metoprolol preparation she felt that she developed some recurrent migraine headaches, which she had not experienced while she was taken propranolol.  She was switched back to propanolol 40 mg bid which has beneficial with reference to her migraine headaches on this increased beta blocker dose.  Her palpitations also have improved. When  I initially evaluated her, she had a soft carotid bruit on physical exam.  She underwent carotid duplex imaging on 07/21/2016.  This revealed mild heterogenous plaque bilaterally with narrowings in the 1-39% range.  She had  normal subclavian and vertebral arteries bilaterally.  Laboratory in August 2017 had shown an LDL cholesterol of 110 and  in February 2017 LDL was 138.    When I last saw her in March 2018, I reviewed recent subclinical atherosclerosis date on a systematic patients without cardiac risk factors.  His cast development of sclerosis which was found to develop even at LDLs, beginning at 60.  She was hasn't to try a statin.  I instituted Zetia 10 mg.  She has tolerated this well.  She underwent recent laboratory which now showed improvement in her lipid studies such that total cholesterol was 143, triglycerides 96, HDL 52, LDL 72.  Creatinine was 1.24 which had improved from 1.37.  She denies chest pain.  She denies palpitations.  At times there is edema of her ankles.  She is unaware of palpitations on her propranolol 40 twice a day.  She presents for evaluation.  Past Medical History:  Diagnosis Date  . Arthritis 2016   R wrist predominantly radius scaphoid and midcarpal Diana Brewer)  . History of migraine remote  . History of shingles   . HLD (hyperlipidemia)   . Hypertension   . Macular degeneration    Diana Brewer  . Diana (mitral valve prolapse)   . Palpitations   . Postherpetic neuralgia 12/27/2012  . Ulcerative colitis ([place])    left sided - proctosigmoiditis (Diana Brewer)    Past Surgical History:  Procedure Laterality Date  . BREAST BIOPSY  1975  .  COLONOSCOPY  2011   [facility]  . TRACHEOSTOMY  1942   due to Strep Throat (?epiglottitis)    Current Medications: Outpatient Medications Prior to Visit  Medication Sig Dispense Refill  . Multiple Vitamins-Minerals (CENTRUM SILVER) tablet Take 1 tablet by mouth daily.     . polyethylene glycol powder (GLYCOLAX/MIRALAX) powder Take 1/2 capful ( 9gm) dissolved in at least 8 oz. Of water or juice once daily 527 g 3  . propranolol (INDERAL) 40 MG tablet Take 1 tablet (40 mg total) by mouth 2 (two) times daily. 60 tablet 3  . sulfaSALAzine (AZULFIDINE)  500 MG tablet TAKE 2 TABLETS(1000 MG) BY MOUTH DAILY 180 tablet 2  . hydrochlorothiazide (MICROZIDE) 12.5 MG capsule Take 1 capsule (12.5 mg total) by mouth every other day. 45 capsule 3  . ezetimibe (ZETIA) 10 MG tablet Take 1 tablet (10 mg total) by mouth daily. 30 tablet 6   No facility-administered medications prior to visit.      Allergies:   Codeine; Cortisone; and Guaifenesin er   Social History   Social History  . Marital status: Married    Spouse name: N/A  . Number of children: N/A  . Years of education: N/A   Social History Main Topics  . Smoking status: Never Smoker  . Smokeless tobacco: Never Used  . Alcohol use No  . Drug use: No  . Sexual activity: No   Other Topics Concern  . None   Social History Narrative   Lives with husband Diana Brewer)   [place] child (daughter)   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[name]   Activity: walks and active in yard, grows vegetables   Diet: good water, good fruits/vegetables daily     Family History  family history includes Cancer in her paternal grandfather; Heart disease in her mother; Hypertension in her mother; Melanoma in her brother and father; Rheum arthritis in her father; Stroke in her mother.   ROS General: Negative; No fevers, chills, or night sweats;  HEENT: Negative; No changes in vision or hearing, sinus congestion, difficulty swallowing Pulmonary: Negative; No cough, wheezing, shortness of breath, hemoptysis Cardiovascular: Positive for Diana, hypertension, and intermittent palpitations GI: Negative; No nausea, vomiting, diarrhea, or abdominal pain GU: Negative; No dysuria, hematuria, or difficulty voiding Musculoskeletal: Negative; no myalgias, joint pain, or weakness Hematologic/Oncology: Negative; no easy bruising, bleeding Endocrine: Negative; no heat/cold intolerance; no diabetes Neuro: Negative; no changes in balance, headaches Skin: Negative; No rashes or skin lesions Psychiatric: Negative; No  behavioral problems, depression Sleep: Negative; No snoring, daytime sleepiness, hypersomnolence, bruxism, restless legs, hypnogognic hallucinations, no cataplexy Other comprehensive 14 point system review is negative.   PHYSICAL EXAM:   VS:  BP 134/82   Pulse 66   Ht _0  (1.549 m)   Wt 147 lb 9.6 oz (67 kg)   BMI 27.89 kg/m     Repeat BP 140/82  Wt Readings from Last 3 Encounters:  02/09/17 147 lb 9.6 oz (67 kg)  07/26/16 144 lb 4 oz (65.4 kg)  07/23/16 143 lb 12.8 oz (65.2 kg)    General: Alert, oriented, no distress.  Skin: normal turgor, no rashes, warm and dry HEENT: Normocephalic, atraumatic. Pupils equal round and reactive to light; sclera anicteric; extraocular muscles intact;  Nose without nasal septal hypertrophy Mouth/Parynx benign; Mallinpatti scale 2 Neck: No JVD, soft left carotid bruit; normal carotid upstroke Lungs: clear to ausculatation and percussion; no wheezing or rales Chest wall: without tenderness to palpitation Heart: PMI not displaced,  RRR, s1 s2 normal, 1/6 systolic murmur, no diastolic murmur, no rubs, gallops, thrills, or heaves Abdomen: soft, nontender; no hepatosplenomehaly, BS+; abdominal aorta nontender and not dilated by palpation. Back: no CVA tenderness Pulses 2+ Musculoskeletal: full range of motion, normal strength, no joint deformities Extremities: Trace ankle edema ; no clubbing, cyanosis , Homan's sign negative  Neurologic: grossly nonfocal; Cranial nerves grossly wnl Psychologic: Normal mood and affect   Studies/Labs Reviewed:   ECG (independently read by me): Normal sinus rhythm at 66 bpm.  No ectopy.  Normal intervals.  March 2018 ECG (independently read by me): NSR at 80; Normal intervals. QTc 405 msec. No ST changes.  January 2018 ECG (independently read by me): Normal sinus rhythm at 85 bpm.  Nonspecific T changes.  QTc interval 445 ms.  October 2017 ECG (independently read by me): Normal sinus rhythm at 63 bpm.  No  ectopy.  Normal intervals.  Recent Labs: BMP Latest Ref Rng & Units 12/28/2016 06/30/2016 03/10/2016  Glucose 65 - 99 mg/dL 90 88 85  BUN 8 - 27 mg/dL _0 Creatinine 0.57 - 1.00 mg/dL 1.24(H) 1.37(H) 1.24(H)  BUN/Creat Ratio 12 - _1 -  Sodium 134 - 144 mmol/L 143 142 142  Potassium 3.5 - 5.2 mmol/L 4.5 4.7 4.3  Chloride 96 - 106 mmol/L 103 102 106  CO2 20 - 29 mmol/L _2 Calcium 8.7 - 10.3 mg/dL 9.2 9.4 9.3     Hepatic Function Latest Ref Rng & Units 12/28/2016 06/30/2016 12/09/2015  Total Protein 6.0 - 8.5 g/dL 5.9(L) 6.1 6.0(L)  Albumin 3.5 - 4.7 g/dL 4.0 4.1 4.0  AST 0 - 40 IU/L _3 ALT 0 - 32 IU/L _4 Alk Phosphatase 39 - 117 IU/L 75 77 64  Total Bilirubin 0.0 - 1.2 mg/dL 0.2 0.3 0.4  Bilirubin, Direct <=0.2 mg/dL - - -    CBC Latest Ref Rng & Units 12/28/2016 06/30/2016 03/10/2016  WBC 3.4 - 10.8 x10E3/uL 6.7 7.2 8.9  Hemoglobin 11.1 - 15.9 g/dL 10.9(L) 11.2 11.3(L)  Hematocrit 34.0 - 46.6 % 32.5(L) 33.4(L) 33.5(L)  Platelets 150 - 379 x10E3/uL 314 332 318   Lab Results  Component Value Date   MCV 97 12/28/2016   MCV 97 06/30/2016   MCV 96.5 03/10/2016   Lab Results  Component Value Date   TSH 3.97 12/09/2015   No results found for: HGBA1C   BNP No results found for: BNP  ProBNP No results found for: PROBNP   Lipid Panel     Component Value Date/Time   CHOL 143 12/28/2016 0815   TRIG 96 12/28/2016 0815   HDL 52 12/28/2016 0815   CHOLHDL 3.4 12/09/2015 0758   VLDL 21 12/09/2015 0758   LDLCALC 72 12/28/2016 0815   LDLDIRECT 132.6 12/27/2012 1146     RADIOLOGY: No results found.   Additional studies/ records that were reviewed today include:  I reviewed previous records by Dr. Mare Brewer.    ASSESSMENT:    1. Diana (mitral valve prolapse)   2. Essential hypertension   3. Stage 3 chronic kidney disease (HCC)   4. Carotid artery plaque, bilateral   5. Hyperlipidemia with target LDL less than 70      PLAN:  Ms Kinnaird  is an 83 year old female who has a history of mitral valve prolapse, hypertension, and palpitations.  She had been on long-standing treatment with propranolol low-dose 20 mg twice a day but had developed  recurrent palpitations.  She did not tolerate the substitution to Toprol due to exacerbation of her migraine.She has felt improved with resumption of propranolol at the increased dose of 40 twice a day denies any palpitations.  She was found to have mild heterogeneous plaque bilaterally in her carotids.  I've reviewed with her.  Data regarding subclinical atherosclerosis and for this reason, I had recommended that we initiate therapy for her LDL in attempt to get this as close to 70 or below.  As possible.  She did not wanted take statin therapy.  Over the past several months she has been unsteady a and as tolerated this well.  Her most recent LDL is significant improvement now at 72.  Her creatinine is 1.24.  She has been taking HCTZ every other day, Toprol 0.5 mg.  She does have trace edema today and her blood pressure on repeat by me was 140/82 and 1 taken by the nurse 134/82.  I suggested that she can try to slightly increase HCTZ frequency and perhaps take this 2 out of 3 days rather than every other day.  She will reduce sodium intake.  She is not having any chest tightness or anginal symptoms.  She's not having palpitations.  Her renal function is stable, but has class III kidney disease.  I will see her in 6 months for further evaluation.   Medication Adjustments/Labs and Tests Ordered: Current medicines are reviewed at length with the patient today.  Concerns regarding medicines are outlined above.  Medication changes, Labs and Tests ordered today are listed in the Patient Instructions below. Patient Instructions  Medication Instructions:  INCREASE HCTZ to 12.78m daily  Follow-Up: Your physician wants you to follow-up in: 6 MONTHS with Dr. KClaiborne Billings You will receive a reminder letter in the mail two  months in advance. If you don't receive a letter, please call our office to schedule the follow-up appointment.   Any Other Special Instructions Will Be Listed Below (If Applicable).     If you need a refill on your cardiac medications before your next appointment, please call your pharmacy.    Signed, TShelva Majestic MD, FDevereux Treatment Network10/07/2016 6:38 PM    [place] [place]  [id number]: [phone]

## 2017-02-09 NOTE — Patient Instructions (Signed)
Medication Instructions:  INCREASE HCTZ to 12.34m daily  Follow-Up: Your physician wants you to follow-up in: 6 MONTHS with Dr. KClaiborne Billings You will receive a reminder letter in the mail two months in advance. If you don't receive a letter, please call our office to schedule the follow-up appointment.   Any Other Special Instructions Will Be Listed Below (If Applicable).     If you need a refill on your cardiac medications before your next appointment, please call your pharmacy.

## 2017-03-10 ENCOUNTER — Other Ambulatory Visit: Payer: Self-pay | Admitting: Cardiovascular Disease

## 2017-03-10 NOTE — Telephone Encounter (Signed)
REFILL 

## 2017-04-04 ENCOUNTER — Other Ambulatory Visit: Payer: Self-pay | Admitting: *Deleted

## 2017-04-04 MED ORDER — PROPRANOLOL HCL 40 MG PO TABS: 40.0000 mg | ORAL_TABLET | Freq: Two times a day (BID) | ORAL | 1 refills | Status: DC

## 2017-06-28 ENCOUNTER — Other Ambulatory Visit: Payer: Self-pay | Admitting: Cardiovascular Disease

## 2017-07-18 ENCOUNTER — Other Ambulatory Visit: Payer: Self-pay | Admitting: *Deleted

## 2017-07-18 MED ORDER — EZETIMIBE 10 MG PO TABS: 10.0000 mg | ORAL_TABLET | Freq: Every day | ORAL | 1 refills | Status: DC

## 2017-07-18 NOTE — Telephone Encounter (Signed)
Rx has been sent to the pharmacy electronically. ° °

## 2017-08-02 ENCOUNTER — Telehealth: Payer: Self-pay | Admitting: Cardiovascular Disease

## 2017-08-02 DIAGNOSIS — E7800 Pure hypercholesterolemia, unspecified: Secondary | ICD-10-CM

## 2017-08-02 DIAGNOSIS — Z5181 Encounter for therapeutic drug level monitoring: Secondary | ICD-10-CM

## 2017-08-02 DIAGNOSIS — D649 Anemia, unspecified: Secondary | ICD-10-CM

## 2017-08-02 DIAGNOSIS — I10 Essential (primary) hypertension: Secondary | ICD-10-CM

## 2017-08-02 NOTE — Telephone Encounter (Signed)
Lab orders place and linked to lab appointment, patient aware

## 2017-08-02 NOTE — Telephone Encounter (Signed)
New Message:    Pt says she needs her lab work,she will need an order. Pt is supposed to see Dr Claiborne Billings in [place] is not able to get an appointment until June.

## 2017-08-02 NOTE — Telephone Encounter (Signed)
Follow Up:     Pt says she usually have her lab work at [facility] wants to have her lab work on Monday.Please put here order in for 08-08-17.

## 2017-08-08 ENCOUNTER — Other Ambulatory Visit: Payer: Medicare Other

## 2017-08-09 ENCOUNTER — Other Ambulatory Visit: Payer: Medicare Other

## 2017-08-09 DIAGNOSIS — E7800 Pure hypercholesterolemia, unspecified: Secondary | ICD-10-CM | POA: Diagnosis not present

## 2017-08-09 DIAGNOSIS — I10 Essential (primary) hypertension: Secondary | ICD-10-CM | POA: Diagnosis not present

## 2017-08-09 DIAGNOSIS — D649 Anemia, unspecified: Secondary | ICD-10-CM | POA: Diagnosis not present

## 2017-08-09 LAB — COMPREHENSIVE METABOLIC PANEL
A/G RATIO: 2.2 (ref 1.2–2.2)
ALT: 16 IU/L (ref 0–32)
AST: 20 IU/L (ref 0–40)
Albumin: 4.1 g/dL (ref 3.5–4.7)
Alkaline Phosphatase: 76 IU/L (ref 39–117)
BILIRUBIN TOTAL: 0.4 mg/dL (ref 0.0–1.2)
BUN/Creatinine Ratio: 16 (ref 12–28)
BUN: 22 mg/dL (ref 8–27)
CO2: 26 mmol/L (ref 20–29)
Calcium: 9.6 mg/dL (ref 8.7–10.3)
Chloride: 104 mmol/L (ref 96–106)
Creatinine, Ser: 1.41 mg/dL — ABNORMAL HIGH (ref 0.57–1.00)
GFR calc Af Amer: 40 mL/min/{1.73_m2} — ABNORMAL LOW (ref >59)
GFR calc non Af Amer: 35 mL/min/{1.73_m2} — ABNORMAL LOW (ref >59)
Globulin, Total: 1.9 g/dL (ref 1.5–4.5)
Glucose: 91 mg/dL (ref 65–99)
POTASSIUM: 5.4 mmol/L — AB (ref 3.5–5.2)
Sodium: 143 mmol/L (ref 134–144)
Total Protein: 6 g/dL (ref 6.0–8.5)

## 2017-08-09 LAB — CBC WITH DIFFERENTIAL/PLATELET
BASOS: 1 %
Basophils Absolute: 0.1 10*3/uL (ref 0.0–0.2)
EOS (ABSOLUTE): 0.3 10*3/uL (ref 0.0–0.4)
Eos: 5 %
HEMATOCRIT: 32.7 % — AB (ref 34.0–46.6)
HEMOGLOBIN: 11.6 g/dL (ref 11.1–15.9)
IMMATURE GRANULOCYTES: 0 %
Immature Grans (Abs): 0 10*3/uL (ref 0.0–0.1)
LYMPHS ABS: 1.8 10*3/uL (ref 0.7–3.1)
LYMPHS: 25 %
MCH: 33 pg (ref 26.6–33.0)
MCHC: 35.5 g/dL (ref 31.5–35.7)
MCV: 93 fL (ref 79–97)
MONOCYTES: 13 %
MONOS ABS: 1 10*3/uL — AB (ref 0.1–0.9)
NEUTROS PCT: 56 %
Neutrophils Absolute: 4.1 10*3/uL (ref 1.4–7.0)
Platelets: 303 10*3/uL (ref 150–379)
RBC: 3.52 x10E6/uL — AB (ref 3.77–5.28)
RDW: 14.2 % (ref 12.3–15.4)
WBC: 7.3 10*3/uL (ref 3.4–10.8)

## 2017-08-09 LAB — LIPID PANEL
CHOL/HDL RATIO: 3.1 ratio (ref 0.0–4.4)
Cholesterol, Total: 166 mg/dL (ref 100–199)
HDL: 53 mg/dL (ref >39)
LDL CALC: 97 mg/dL (ref 0–99)
TRIGLYCERIDES: 80 mg/dL (ref 0–149)
VLDL Cholesterol Cal: 16 mg/dL (ref 5–40)

## 2017-08-11 ENCOUNTER — Other Ambulatory Visit: Payer: Self-pay | Admitting: *Deleted

## 2017-08-11 DIAGNOSIS — Z79899 Other long term (current) drug therapy: Secondary | ICD-10-CM

## 2017-08-13 ENCOUNTER — Other Ambulatory Visit: Payer: Self-pay | Admitting: Internal Medicine

## 2017-08-17 ENCOUNTER — Other Ambulatory Visit: Payer: Medicare Other | Admitting: *Deleted

## 2017-08-17 DIAGNOSIS — Z79899 Other long term (current) drug therapy: Secondary | ICD-10-CM

## 2017-08-17 LAB — BASIC METABOLIC PANEL
BUN/Creatinine Ratio: 15 (ref 12–28)
BUN: 19 mg/dL (ref 8–27)
CO2: 26 mmol/L (ref 20–29)
CREATININE: 1.25 mg/dL — AB (ref 0.57–1.00)
Calcium: 9.3 mg/dL (ref 8.7–10.3)
Chloride: 102 mmol/L (ref 96–106)
GFR calc Af Amer: 47 mL/min/{1.73_m2} — ABNORMAL LOW (ref >59)
GFR calc non Af Amer: 40 mL/min/{1.73_m2} — ABNORMAL LOW (ref >59)
GLUCOSE: 88 mg/dL (ref 65–99)
Potassium: 4.6 mmol/L (ref 3.5–5.2)
Sodium: 143 mmol/L (ref 134–144)

## 2017-08-19 ENCOUNTER — Telehealth: Payer: Self-pay | Admitting: Cardiovascular Disease

## 2017-08-19 NOTE — Telephone Encounter (Signed)
New Message  Pt is calling to get the results to her lab. Please call

## 2017-08-19 NOTE — Telephone Encounter (Signed)
Patient aware and verbalized understanding. °

## 2017-08-22 ENCOUNTER — Ambulatory Visit (INDEPENDENT_AMBULATORY_CARE_PROVIDER_SITE_OTHER): Payer: Medicare Other | Admitting: Cardiovascular Disease

## 2017-08-22 ENCOUNTER — Encounter: Payer: Self-pay | Admitting: Cardiovascular Disease

## 2017-08-22 VITALS — BP 132/76 | HR 74 | Ht 61.0 in | Wt 150.6 lb

## 2017-08-22 DIAGNOSIS — I10 Essential (primary) hypertension: Secondary | ICD-10-CM | POA: Diagnosis not present

## 2017-08-22 DIAGNOSIS — I6523 Occlusion and stenosis of bilateral carotid arteries: Secondary | ICD-10-CM

## 2017-08-22 DIAGNOSIS — E785 Hyperlipidemia, unspecified: Secondary | ICD-10-CM

## 2017-08-22 DIAGNOSIS — E7800 Pure hypercholesterolemia, unspecified: Secondary | ICD-10-CM

## 2017-08-22 DIAGNOSIS — I341 Nonrheumatic mitral (valve) prolapse: Secondary | ICD-10-CM | POA: Diagnosis not present

## 2017-08-22 DIAGNOSIS — N183 Chronic kidney disease, stage 3 unspecified: Secondary | ICD-10-CM

## 2017-08-22 NOTE — Progress Notes (Signed)
Cardiology Office Note    Date:  08/24/2017   ID:  Diana Brewer, DOB 1934-01-03, MRN [id number]  PCP:  Lucille Passy, MD  Cardiologist:  [name], MD   Chief Complaint  Patient presents with  . Follow-up    History of Present Illness:  Diana Brewer is a 84 y.o. female who has been followed by Dr. Mare Ferrari almost 30 years.  I have cared for several of her family members. She established cardiology care with me in October 2017.  She presents for a 6  month follow-up cardiology evaluation.  [name] has a long-standing history of hypertension, as well as mitral valve prolapse.  She has a history of hyperlipidemia, as well as chronic inflammatory proctitis.  She has been maintained on propranolol 20 mg twice a day for palpitations and at times has to take extra pill for breakthrough ectopy.  She has complained that her weight has been gradually increasing.  Dr. Mare Ferrari had previously checked a TSH.  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  She denies any recent episodes of chest pain.  She denies any change in development of shortness of breath.    When I  saw her, since she was taken propranolol 20 mg twice a day, but at times needed additional doses due to recurrent palpitations  I suggested substituting this for Toprol-XL 25 mg daily with plans to titrate as needed.  However, on the metoprolol preparation she felt that she developed some recurrent migraine headaches, which she had not experienced while she was taken propranolol.  She was switched back to propanolol 40 mg bid which has beneficial with reference to her migraine headaches on this increased beta blocker dose.  Her palpitations also have improved. When  I initially evaluated her, she had a soft carotid bruit on physical exam.  She underwent carotid duplex imaging on 07/21/2016.  This revealed mild heterogenous plaque bilaterally with narrowings in  the 1-39% range.  She had normal subclavian and vertebral arteries bilaterally.  Laboratory in August 2017 had shown an LDL cholesterol of 110 and  in February 2017 LDL was 138.    When I last saw her in March 2018, I reviewed recent subclinical atherosclerosis date on a systematic patients without cardiac risk factors.  His cast development of sclerosis which was found to develop even at LDLs, beginning at 60.  She was hasn't to try a statin.  I instituted Zetia 10 mg.  She has tolerated this well.  She underwent recent laboratory which now showed improvement in her lipid studies such that total cholesterol was 143, triglycerides 96, HDL 52, LDL 72.  Creatinine was 1.24 which had improved from 1.37.    Since I last saw her, she denies any chest pain or palpitations.  She continues to take propranolol 40 mg twice a day and has been taking HCTZ 12.5 mg every other day.  Apparently, she has been cutting her Zetia 10 mg in half.  Laboratory one week previously has shown a creatinine of 1.25 which was slightly improved from 2 weeks ago when it was 1.41.  At that time, her HCTZ was reduced to every other day. Lipid studies have shown her LDL cholesterol increased to 97 when 7 months ago it was 72.  She presents for reevaluation.  Past Medical History:  Diagnosis Date  . 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  History of migraine remote  . History of shingles   . HLD (hyperlipidemia)   . Hypertension   . Macular degeneration    Diana Brewer  . MVP (mitral valve prolapse)   . Palpitations   . Postherpetic neuralgia 12/27/2012  . Ulcerative colitis ([place])    left sided - proctosigmoiditis ([name])    Past Surgical History:  Procedure Laterality Date  . BREAST BIOPSY  1975  . COLONOSCOPY  2011   [facility]  . TRACHEOSTOMY  1942   due to Strep Throat (?epiglottitis)    Current Medications: Outpatient Medications Prior to Visit  Medication Sig Dispense  Refill  . ezetimibe (ZETIA) 10 MG tablet Take 1 tablet (10 mg total) by mouth daily. 90 tablet 1  . hydrochlorothiazide (MICROZIDE) 12.5 MG capsule TAKE 1 CAPSULE (12.5 MG TOTAL) BY MOUTH EVERY OTHER DAY. 45 capsule 3  . Multiple Vitamins-Minerals (CENTRUM SILVER) tablet Take 1 tablet by mouth daily.     . polyethylene glycol powder (GLYCOLAX/MIRALAX) powder Take 1/2 capful ( 9gm) dissolved in at least 8 oz. Of water or juice once daily 527 g 3  . propranolol (INDERAL) 40 MG tablet Take 1 tablet (40 mg total) by mouth 2 (two) times daily. 180 tablet 1  . sulfaSALAzine (AZULFIDINE) 500 MG tablet TAKE 2 TABLETS(1000 MG) BY MOUTH DAILY 180 tablet 2   No facility-administered medications prior to visit.      Allergies:   Codeine; Cortisone; and Guaifenesin er   Social History   Socioeconomic History  . Marital status: Married    Spouse name: Not on file  . Number of children: Not on file  . Years of education: Not on file  . Highest education level: Not on file  Occupational History  . Not on file  Social Needs  . Financial resource strain: Not on file  . Food insecurity:    Worry: Not on file    Inability: Not on file  . Transportation needs:    Medical: Not on file    Non-medical: Not on file  Tobacco Use  . Smoking status: Never Smoker  . Smokeless tobacco: Never Used  Substance and Sexual Activity  . Alcohol use: No  . Drug use: No  . Sexual activity: Never  Lifestyle  . Physical activity:    Days per week: Not on file    Minutes per session: Not on file  . Stress: Not on file  Relationships  . Social connections:    Talks on phone: Not on file    Gets together: Not on file    Attends religious service: Not on file    Active member of club or organization: Not on file    Attends meetings of clubs or organizations: Not on file    Relationship status: Not on file  Other Topics Concern  . Not on file  Social History Narrative   Lives with husband [name])   [place]  child (daughter)   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[name]   Activity: walks and active in yard, grows vegetables   Diet: good water, good fruits/vegetables daily     Family History  family history includes Cancer in her paternal grandfather; Heart disease in her mother; Hypertension in her mother; Melanoma in her brother and father; Rheum arthritis in her father; Stroke in her mother.   ROS General: Negative; No fevers, chills, or night sweats;  HEENT: Negative; No changes in vision or hearing, sinus congestion, difficulty swallowing Pulmonary: Negative; No  cough, wheezing, shortness of breath, hemoptysis Cardiovascular: Positive for MVP, hypertension, and intermittent palpitations GI: Negative; No nausea, vomiting, diarrhea, or abdominal pain GU: Negative; No dysuria, hematuria, or difficulty voiding Musculoskeletal: Negative; no myalgias, joint pain, or weakness Hematologic/Oncology: Negative; no easy bruising, bleeding Endocrine: Negative; no heat/cold intolerance; no diabetes Neuro: Negative; no changes in balance, headaches Skin: Negative; No rashes or skin lesions Psychiatric: Negative; No behavioral problems, depression Sleep: Negative; No snoring, daytime sleepiness, hypersomnolence, bruxism, restless legs, hypnogognic hallucinations, no cataplexy Other comprehensive 14 point system review is negative.   PHYSICAL EXAM:   VS:  BP 132/76   Pulse 74   Ht 5' 1"  (1.549 m)   Wt 150 lb 9.6 oz (68.3 kg)   BMI 28.46 kg/m     Repeat blood pressure by me 134/78  Wt Readings from Last 3 Encounters:  08/22/17 150 lb 9.6 oz (68.3 kg)  02/09/17 147 lb 9.6 oz (67 kg)  07/26/16 144 lb 4 oz (65.4 kg)    General: Alert, oriented, no distress.  Skin: normal turgor, no rashes, warm and dry HEENT: Normocephalic, atraumatic. Pupils equal round and reactive to light; sclera anicteric; extraocular muscles intact;  Nose without nasal septal hypertrophy Mouth/Parynx benign;  Mallinpatti scale 2 Neck: No JVD, no carotid bruits; normal carotid upstroke Lungs: clear to ausculatation and percussion; no wheezing or rales Chest wall: without tenderness to palpitation Heart: PMI not displaced, RRR, s1 s2 normal, 1/6 systolic murmur, no diastolic murmur, no rubs, gallops, thrills, or heaves Abdomen: soft, nontender; no hepatosplenomehaly, BS+; abdominal aorta nontender and not dilated by palpation. Back: no CVA tenderness Pulses 2+ Musculoskeletal: full range of motion, normal strength, no joint deformities Extremities: no clubbing cyanosis or edema, Homan's sign negative  Neurologic: grossly nonfocal; Cranial nerves grossly wnl Psychologic: Normal mood and affect   Studies/Labs Reviewed:   ECG (independently read by me): Normal sinus rhythm at 74 bpm.  Normal intervals.  Nonspecific T changes.  October 2018 ECG (independently read by me): Normal sinus rhythm at 66 bpm.  No ectopy.  Normal intervals.  March 2018 ECG (independently read by me): NSR at 80; Normal intervals. QTc 405 msec. No ST changes.  January 2018 ECG (independently read by me): Normal sinus rhythm at 85 bpm.  Nonspecific T changes.  QTc interval 445 ms.  October 2017 ECG (independently read by me): Normal sinus rhythm at 63 bpm.  No ectopy.  Normal intervals.  Recent Labs: BMP Latest Ref Rng & Units 08/17/2017 08/09/2017 12/28/2016  Glucose 65 - 99 mg/dL 88 91 90  BUN 8 - 27 mg/dL 19 22 18   Creatinine 0.57 - 1.00 mg/dL 1.25(H) 1.41(H) 1.24(H)  BUN/Creat Ratio 12 - 28 15 16 15   Sodium 134 - 144 mmol/L 143 143 143  Potassium 3.5 - 5.2 mmol/L 4.6 5.4(H) 4.5  Chloride 96 - 106 mmol/L 102 104 103  CO2 20 - 29 mmol/L 26 26 26   Calcium 8.7 - 10.3 mg/dL 9.3 9.6 9.2     Hepatic Function Latest Ref Rng & Units 08/09/2017 12/28/2016 06/30/2016  Total Protein 6.0 - 8.5 g/dL 6.0 5.9(L) 6.1  Albumin 3.5 - 4.7 g/dL 4.1 4.0 4.1  AST 0 - 40 IU/L 20 20 21   ALT 0 - 32 IU/L 16 17 18   Alk Phosphatase 39 - 117  IU/L 76 75 77  Total Bilirubin 0.0 - 1.2 mg/dL 0.4 0.2 0.3  Bilirubin, Direct <=0.2 mg/dL - - -    CBC Latest Ref Rng & Units 08/09/2017  12/28/2016 06/30/2016  WBC 3.4 - 10.8 x10E3/uL 7.3 6.7 7.2  Hemoglobin 11.1 - 15.9 g/dL 11.6 10.9(L) 11.2  Hematocrit 34.0 - 46.6 % 32.7(L) 32.5(L) 33.4(L)  Platelets 150 - 379 x10E3/uL 303 314 332   Lab Results  Component Value Date   MCV 93 08/09/2017   MCV 97 12/28/2016   MCV 97 06/30/2016   Lab Results  Component Value Date   TSH 3.97 12/09/2015   No results found for: HGBA1C   BNP No results found for: BNP  ProBNP No results found for: PROBNP   Lipid Panel     Component Value Date/Time   CHOL 166 08/09/2017 0826   TRIG 80 08/09/2017 0826   HDL 53 08/09/2017 0826   CHOLHDL 3.1 08/09/2017 0826   CHOLHDL 3.4 12/09/2015 0758   VLDL 21 12/09/2015 0758   LDLCALC 97 08/09/2017 0826   LDLDIRECT 132.6 12/27/2012 1146     RADIOLOGY: No results found.   Additional studies/ records that were reviewed today include:  I reviewed previous records by Dr. Mare Ferrari. Recent laboratory was reviewed  ASSESSMENT:    1. Essential hypertension   2. Hypercholesterolemia   3. Stage 3 chronic kidney disease (HCC)   4. Carotid artery plaque, bilateral   5. Hyperlipidemia with target LDL less than 70   6. MVP (mitral valve prolapse)      PLAN:  Ms Vandevander is an 84 year old female who has a history of mitral valve prolapse, hypertension, and palpitations.  She had been on long-standing treatment with propranolol low-dose 20 mg twice a day but had developed recurrent palpitations.  She did not tolerate the substitution to Toprol due to exacerbation of her migraine.She has felt improved with resumption of propranolol at the increased dose of 40 twice a day denies any palpitations.  Her echo Doppler study in March 2018 showed mild heterogeneous plaque bilaterally in her carotids.  I again reviewed data regarding subclinical atherosclerosis.  I  started her on Zetia 10 mg.  Apparently recently, she has self reduce this to only 5 mg.  Her most recent LDL cholesterol had risen up to 97.  I again recommended taking the entire 10 mg pill on a daily basis.  She did have mild renal insufficiency which improved with reduction of HCTZ to 12.5 mg every other day.  She denies recent migraines.  Her blood pressure today is upper normal but stable.  She'll continue current therapy.  I will see her in 6 months for reevaluation.  Medication Adjustments/Labs and Tests Ordered: Current medicines are reviewed at length with the patient today.  Concerns regarding medicines are outlined above.  Medication changes, Labs and Tests ordered today are listed in the Patient Instructions below. Patient Instructions  Medication Instructions:  Take Zetia every day  Follow-Up: Your physician wants you to follow-up in: 6 months with Dr. Claiborne Billings.  You will receive a reminder letter in the mail two months in advance. If you don't receive a letter, please call our office to schedule the follow-up appointment.   Any Other Special Instructions Will Be Listed Below (If Applicable).     If you need a refill on your cardiac medications before your next appointment, please call your pharmacy.    Signed, [name], MD, [facility] 08/24/2017 9:32 PM    [facility] [address] [place], [place], [place]  [address] Phone: [phone]

## 2017-08-22 NOTE — Patient Instructions (Signed)
Medication Instructions:  Take Zetia every day  Follow-Up: Your physician wants you to follow-up in: 6 months with Dr. Claiborne Billings.  You will receive a reminder letter in the mail two months in advance. If you don't receive a letter, please call our office to schedule the follow-up appointment.   Any Other Special Instructions Will Be Listed Below (If Applicable).     If you need a refill on your cardiac medications before your next appointment, please call your pharmacy.

## 2017-08-24 ENCOUNTER — Encounter: Payer: Self-pay | Admitting: Cardiovascular Disease

## 2017-08-30 ENCOUNTER — Other Ambulatory Visit: Payer: Self-pay | Admitting: Internal Medicine

## 2017-08-30 ENCOUNTER — Telehealth: Payer: Self-pay | Admitting: Internal Medicine

## 2017-08-30 MED ORDER — SULFASALAZINE 500 MG PO TABS: ORAL_TABLET | ORAL | 0 refills | Status: DC

## 2017-08-30 NOTE — Telephone Encounter (Signed)
Rx sent to pharmacy while awaiting office visit 11/14/17

## 2017-09-07 ENCOUNTER — Other Ambulatory Visit: Payer: Self-pay | Admitting: Family Medicine

## 2017-09-07 MED ORDER — NYSTATIN 100000 UNIT/GM EX POWD: Freq: Four times a day (QID) | CUTANEOUS | 0 refills | Status: DC

## 2017-09-14 ENCOUNTER — Other Ambulatory Visit: Payer: Self-pay | Admitting: Cardiovascular Disease

## 2017-09-20 ENCOUNTER — Other Ambulatory Visit: Payer: Self-pay | Admitting: Cardiovascular Disease

## 2017-11-03 ENCOUNTER — Ambulatory Visit: Payer: Medicare Other | Admitting: Cardiovascular Disease

## 2017-11-04 ENCOUNTER — Encounter

## 2017-11-14 ENCOUNTER — Ambulatory Visit (INDEPENDENT_AMBULATORY_CARE_PROVIDER_SITE_OTHER): Payer: Medicare Other | Admitting: Internal Medicine

## 2017-11-14 ENCOUNTER — Encounter: Payer: Self-pay | Admitting: Internal Medicine

## 2017-11-14 VITALS — BP 128/74 | HR 78 | Ht 61.0 in | Wt 146.6 lb

## 2017-11-14 DIAGNOSIS — I6523 Occlusion and stenosis of bilateral carotid arteries: Secondary | ICD-10-CM | POA: Diagnosis not present

## 2017-11-14 DIAGNOSIS — K5909 Other constipation: Secondary | ICD-10-CM | POA: Diagnosis not present

## 2017-11-14 DIAGNOSIS — D649 Anemia, unspecified: Secondary | ICD-10-CM

## 2017-11-14 DIAGNOSIS — K5130 Ulcerative (chronic) rectosigmoiditis without complications: Secondary | ICD-10-CM | POA: Diagnosis not present

## 2017-11-14 MED ORDER — SULFASALAZINE 500 MG PO TABS: ORAL_TABLET | ORAL | 2 refills | Status: DC

## 2017-11-14 NOTE — Patient Instructions (Signed)
We have sent the following medications to your pharmacy for you to pick up at your convenience: Sulfasalazine 1000 mg daily  Please purchase the following medications over the counter and take as directed: Miralax 1/2 capful daily (9 grams)   Please follow up with Dr Hilarie Fredrickson in 1 year.  If you are age 82 or older, your body mass index should be between 23-30. Your Body mass index is 27.7 kg/m. If this is out of the aforementioned range listed, please consider follow up with your Primary Care Provider.  If you are age 60 or younger, your body mass index should be between 19-25. Your Body mass index is 27.7 kg/m. If this is out of the aformentioned range listed, please consider follow up with your Primary Care Provider.

## 2017-11-14 NOTE — Progress Notes (Signed)
   Subjective:    Patient ID: Diana Brewer, female    DOB: 1934-01-29, 84 y.o.   MRN: [id number]  HPI Diana Brewer is an 84 year old female with a history of distal proctosigmoiditis, history of mild constipation, hyperlipidemia, MVP, hypertension who is here for follow-up.  She was last seen in March 2018 and she is here alone today.  She reports that she has been doing well.  She has continued sulfasalazine 1000 mg once daily.  Bowel movements have been regular for her which is usually once per day.  She is using half dose MiraLAX on most days.  No blood in her stool or melena.  No abdominal or rectal pain.  No tenesmus.  No upper GI or hepatobiliary complaint.  She does eat a diet high in vegetables and thus fiber.  She is off of her fiber supplement.  Last colonoscopy was in 2011 with [name].  Mild mucosal changes to 10 cm which was biopsied.  This showed minimally active chronic colitis.  Colon otherwise normal except for mild sigmoid diverticulosis.  She follows with Dr. Claiborne Billings with cardiology.  She had lab work done several months ago which I have reviewed.  Her liver enzymes were normal.  Kidney function was stable.  Blood counts were stable.  hemoglobin was 11.6 with normal MCV chronic proctosigmoiditis  Review of Systems As per HPI, otherwise negative  Current Medications, Allergies, Past Medical History, Past Surgical History, Family History and Social History were reviewed in [employer] record.     Objective:   Physical Exam BP 128/74   Pulse 78   Ht 5' 1"  (1.549 m)   Wt 146 lb 9.6 oz (66.5 kg)   SpO2 96%   BMI 27.70 kg/m  Constitutional: Well-developed and well-nourished. No distress. HEENT: Normocephalic and atraumatic. Conjunctivae are normal.  No scleral icterus. Neck: Neck supple. Trachea midline. Cardiovascular: Normal rate, regular rhythm and intact distal pulses.  Pulmonary/chest: Effort normal and breath sounds normal. No  wheezing, rales or rhonchi. Abdominal: Soft, nontender, nondistended. Bowel sounds active throughout. There are no masses palpable. No hepatosplenomegaly. Extremities: no clubbing, cyanosis, or edema Neurological: Alert and oriented to person place and time. Skin: Skin is warm and dry. Psychiatric: Normal mood and affect. Behavior is normal.     Assessment & Plan:  84 year old female with a history of distal proctosigmoiditis, history of mild constipation, hyperlipidemia, MVP, hypertension who is here for follow-up.   1.  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  She has maintained clinical remission on sulfasalazine.  We will continue sulfasalazine 1000 mg daily.  She does not wish to pursue colonoscopy for surveillance of her colitis based on age.  This is reasonable.  2.  Mild chronic constipation --doing well on half dose MiraLAX daily.  Continue with this regimen.  3.  Normocytic anemia --mild and stable with no evidence of GI blood loss.  15 minutes spent with the patient today. Greater than 50% was spent in counseling and coordination of care with the patient  Annual follow-up, sooner if needed.  Medications can be refilled until follow-up if needed

## 2018-01-24 DIAGNOSIS — H353131 Nonexudative age-related macular degeneration, bilateral, early dry stage: Secondary | ICD-10-CM | POA: Diagnosis not present

## 2018-01-24 DIAGNOSIS — H2513 Age-related nuclear cataract, bilateral: Secondary | ICD-10-CM | POA: Diagnosis not present

## 2018-02-01 ENCOUNTER — Telehealth: Payer: Self-pay | Admitting: Cardiovascular Disease

## 2018-02-01 DIAGNOSIS — I119 Hypertensive heart disease without heart failure: Secondary | ICD-10-CM

## 2018-02-01 DIAGNOSIS — E7800 Pure hypercholesterolemia, unspecified: Secondary | ICD-10-CM

## 2018-02-01 NOTE — Telephone Encounter (Signed)
Pt rqst to have her routine labs drawn at [facility] prior to her o/v with Jory Sims, D-NO on 02/27/18. Lab appt scheduled for 10/15. Adv pt that she should be fasting prior to labs.

## 2018-02-01 NOTE — Telephone Encounter (Signed)
New message  Patient would like blood work ordered before her appt on 02/27/2018 w/Katherine Purcell Nails.

## 2018-02-21 ENCOUNTER — Other Ambulatory Visit: Payer: Medicare Other

## 2018-02-21 DIAGNOSIS — E7800 Pure hypercholesterolemia, unspecified: Secondary | ICD-10-CM

## 2018-02-21 DIAGNOSIS — I119 Hypertensive heart disease without heart failure: Secondary | ICD-10-CM

## 2018-02-21 LAB — COMPREHENSIVE METABOLIC PANEL
ALT: 16 IU/L (ref 0–32)
AST: 19 IU/L (ref 0–40)
Albumin/Globulin Ratio: 2.1 (ref 1.2–2.2)
Albumin: 4 g/dL (ref 3.5–4.7)
Alkaline Phosphatase: 76 IU/L (ref 39–117)
BUN / CREAT RATIO: 19 (ref 12–28)
BUN: 31 mg/dL — AB (ref 8–27)
Bilirubin Total: 0.3 mg/dL (ref 0.0–1.2)
CO2: 25 mmol/L (ref 20–29)
CREATININE: 1.61 mg/dL — AB (ref 0.57–1.00)
Calcium: 9.6 mg/dL (ref 8.7–10.3)
Chloride: 103 mmol/L (ref 96–106)
GFR calc Af Amer: 34 mL/min/{1.73_m2} — ABNORMAL LOW (ref >59)
GFR calc non Af Amer: 30 mL/min/{1.73_m2} — ABNORMAL LOW (ref >59)
GLUCOSE: 91 mg/dL (ref 65–99)
Globulin, Total: 1.9 g/dL (ref 1.5–4.5)
Potassium: 5.1 mmol/L (ref 3.5–5.2)
Sodium: 143 mmol/L (ref 134–144)
Total Protein: 5.9 g/dL — ABNORMAL LOW (ref 6.0–8.5)

## 2018-02-21 LAB — LIPID PANEL
Chol/HDL Ratio: 3.2 ratio (ref 0.0–4.4)
Cholesterol, Total: 164 mg/dL (ref 100–199)
HDL: 52 mg/dL (ref >39)
LDL Calculated: 97 mg/dL (ref 0–99)
Triglycerides: 76 mg/dL (ref 0–149)
VLDL CHOLESTEROL CAL: 15 mg/dL (ref 5–40)

## 2018-02-23 NOTE — Progress Notes (Signed)
Cardiology Office Note   Date:  02/27/2018   ID:  Diana Brewer, DOB 1933/06/17, MRN [id number]  PCP:  Lucille Passy, MD  Cardiologist:  [facility]  Chief Complaint  Patient presents with  . Palpitations  . Hypertension  . Hyperlipidemia     History of Present Illness: Diana Brewer is a 84 y.o. female who presents for ongoing assessment and management of HTN, mitral valve prolapse, palpitations and HL. She is treated with propanolol for palpitations. Other history includes CKD Stage III, carotid artery disease with most recent carotid duplex imaging in March 2018 revealing mild heterogeneous plaque bilaterally with narrowing in the 1 to 39% range.  She also had normal subclavian and vertebral arteries bilaterally.  On last office visit on 08/22/2017 the patient had been placed on Zetia 10 mg and had tolerated well with total cholesterol 143, LDL of 72.  She apparently was only taking 5 mg of Zetia daily she was clinically stable.  She is now taking Zetia 10 QOD. 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 She also has eliminated high cholesterol foods. She denies any new symptoms.   Past Medical History:  Diagnosis Date  . 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 History of migraine remote  . History of shingles   . HLD (hyperlipidemia)   . Hypertension   . Macular degeneration    [name]  . MVP (mitral valve prolapse)   . Palpitations   . Postherpetic neuralgia 12/27/2012  . Ulcerative colitis ([place])    left sided - proctosigmoiditis ([name])    Past Surgical History:  Procedure Laterality Date  . BREAST BIOPSY  1975  . COLONOSCOPY  2011   [facility]  . TRACHEOSTOMY  1942   due to Strep Throat (?epiglottitis)     Current Outpatient Medications  Medication Sig Dispense Refill  . ezetimibe (ZETIA) 10 MG tablet  Take 1 tablet (10 mg total) by mouth daily. (Patient taking differently: Take 10 mg by mouth every other day. ) 90 tablet 1  . hydrochlorothiazide (MICROZIDE) 12.5 MG capsule TAKE 1 CAPSULE (12.5 MG TOTAL) BY MOUTH EVERY OTHER DAY. 45 capsule 3  . Multiple Vitamins-Minerals (CENTRUM SILVER) tablet Take 1 tablet by mouth daily.     . polyethylene glycol powder (GLYCOLAX/MIRALAX) powder Take 1/2 capful ( 9gm) dissolved in at least 8 oz. Of water or juice once daily 527 g 3  . propranolol (INDERAL) 40 MG tablet TAKE 1 TABLET BY MOUTH TWICE A DAY 180 tablet 1  . sulfaSALAzine (AZULFIDINE) 500 MG tablet TAKE 2 TABLETS(1000 MG) BY MOUTH DAILY. 180 tablet 2   No current facility-administered medications for this visit.     Allergies:   Codeine; Cortisone; and Guaifenesin er    Social History:  The patient  reports that she has never smoked. She has never used smokeless tobacco. She reports that she does not drink alcohol or use drugs.   Family History:  The patient's family history includes Cancer in her paternal grandfather; Heart disease in her mother; Hypertension in her mother; Melanoma in her brother and father; Rheum arthritis in her father; Stroke in her mother.    ROS: All other systems are reviewed and negative. Unless otherwise mentioned in H&P    PHYSICAL EXAM: VS:  Ht 5' 1"  (1.549 m)   BMI 27.70  kg/m  , BMI Body mass index is 27.7 kg/m. GEN: Well nourished, well developed, in no acute distress HEENT: normal Neck: no JVD, carotid bruits, or masses Cardiac: RRR; no murmurs, rubs, or gallops,no edema  Respiratory:  Clear to auscultation bilaterally, normal work of breathing GI: soft, nontender, nondistended, + BS MS: no deformity or atrophy Skin: warm and dry, no rash Neuro:  Strength and sensation are intact Psych: euthymic mood, full affect   EKG:  Not completed this office visit.   Recent Labs: 08/09/2017: Hemoglobin 11.6; Platelets 303 02/21/2018: ALT 16; BUN 31;  Creatinine, Ser 1.61; Potassium 5.1; Sodium 143    Lipid Panel    Component Value Date/Time   CHOL 164 02/21/2018 0819   TRIG 76 02/21/2018 0819   HDL 52 02/21/2018 0819   CHOLHDL 3.2 02/21/2018 0819   CHOLHDL 3.4 12/09/2015 0758   VLDL 21 12/09/2015 0758   LDLCALC 97 02/21/2018 0819   LDLDIRECT 132.6 12/27/2012 1146      Wt Readings from Last 3 Encounters:  11/14/17 146 lb 9.6 oz (66.5 kg)  08/22/17 150 lb 9.6 oz (68.3 kg)  02/09/17 147 lb 9.6 oz (67 kg)      Other studies Reviewed: Lipid Profile 02/21/2018  TC 164, HDL 52, LDL 97, TG 76.   ASSESSMENT AND PLAN:  1. Hypertension: I rechecked her BP myself in clinic room. !28/60. She states that since she had shingles, she has had high BP.I have reassured her that her BP is currently well controlled on her current regimen.   2. Hyperlipidemia: LDL 97 on most recent labs as above. She is to continue being active, taking Zetia, and avoiding high cholesterol foods.          3. Palpitations: Controlled on propanolol.   4. Hx of Mitral Valve Prolapse: She is asymptomatic with this.   Current medicines are reviewed at length with the patient today.  Refills provided on cardiac medications.   Labs/ tests ordered today include: None  Phill Myron. West Pugh, ANP, AACC   02/27/2018 8:09 AM    [place] [address] Office [phone] Fax [phone]

## 2018-02-27 ENCOUNTER — Ambulatory Visit (INDEPENDENT_AMBULATORY_CARE_PROVIDER_SITE_OTHER): Payer: Medicare Other | Admitting: Adult Health

## 2018-02-27 ENCOUNTER — Encounter: Payer: Self-pay | Admitting: Adult Health

## 2018-02-27 VITALS — BP 134/60 | HR 71 | Ht 61.0 in | Wt 146.4 lb

## 2018-02-27 DIAGNOSIS — E7800 Pure hypercholesterolemia, unspecified: Secondary | ICD-10-CM

## 2018-02-27 DIAGNOSIS — I341 Nonrheumatic mitral (valve) prolapse: Secondary | ICD-10-CM | POA: Diagnosis not present

## 2018-02-27 DIAGNOSIS — I6523 Occlusion and stenosis of bilateral carotid arteries: Secondary | ICD-10-CM | POA: Diagnosis not present

## 2018-02-27 DIAGNOSIS — R002 Palpitations: Secondary | ICD-10-CM

## 2018-02-27 DIAGNOSIS — I10 Essential (primary) hypertension: Secondary | ICD-10-CM | POA: Diagnosis not present

## 2018-02-27 MED ORDER — PROPRANOLOL HCL 40 MG PO TABS: 40.0000 mg | ORAL_TABLET | Freq: Two times a day (BID) | ORAL | 1 refills | Status: DC

## 2018-02-27 MED ORDER — EZETIMIBE 10 MG PO TABS: 10.0000 mg | ORAL_TABLET | Freq: Every day | ORAL | 1 refills | Status: DC

## 2018-02-27 MED ORDER — HYDROCHLOROTHIAZIDE 12.5 MG PO CAPS: 12.5000 mg | ORAL_CAPSULE | ORAL | 3 refills | Status: DC

## 2018-02-27 NOTE — Patient Instructions (Signed)
Medication Instructions:  NO CHANGES- Your physician recommends that you continue on your current medications as directed. Please refer to the Current Medication list given to you today.  If you need a refill on your cardiac medications before your next appointment, please call your pharmacy.  Labwork: If you have labs (blood work) drawn today and your tests are completely normal, you will receive your results only by: Marland Kitchen MyChart Message (if you have MyChart) OR . A paper copy in the mail If you have any lab test that is abnormal or we need to change your treatment, we will call you to review the results.  Follow-Up: You will need a follow up appointment in 6 MONTHS.  Please call our office 2 months in advance(JAN 2020) to schedule the (MARCH 2020) appointment.  You may see  DR Claiborne Billings  or one of the following Advanced Practice Providers on your designated Care Team:   . Almyra Deforest, PA-C . Fabian Sharp, PA-C  At [facility], you and your health needs are our priority.  As part of our continuing mission to provide you with exceptional heart care, we have created designated Provider Care Teams.  These Care Teams include your primary Cardiologist (physician) and Advanced Practice Providers (APPs -  Physician Assistants and Nurse Practitioners) who all work together to provide you with the care you need, when you need it.  Thank you for choosing [facility]!!

## 2018-06-12 ENCOUNTER — Other Ambulatory Visit: Payer: Self-pay | Admitting: Internal Medicine

## 2018-06-14 ENCOUNTER — Other Ambulatory Visit: Payer: Self-pay | Admitting: Internal Medicine

## 2018-06-22 ENCOUNTER — Telehealth: Payer: Self-pay | Admitting: Internal Medicine

## 2018-06-22 NOTE — Telephone Encounter (Signed)
Pt reported that she had previously taken 2 t sulfasalazine bid but is now taking 2 t d.  Pt would like to increase dose.  Please CB to advise.

## 2018-06-23 NOTE — Telephone Encounter (Signed)
Patient states that she has only had a couple episodes of blood in her stool over the last couple weeks and she is not even sure this is not related to her hemorrhoids. She is scheduled to see Nicoletta Ba, PA-C on 06/29/18 for office visit and I have asked that she not make any adjustment to her sulfasalazine until she sees Amy especially since her symptoms seem to be mild and infrequent at this time.

## 2018-06-29 ENCOUNTER — Encounter (INDEPENDENT_AMBULATORY_CARE_PROVIDER_SITE_OTHER): Payer: Self-pay

## 2018-06-29 ENCOUNTER — Ambulatory Visit (INDEPENDENT_AMBULATORY_CARE_PROVIDER_SITE_OTHER): Payer: Medicare Other | Admitting: Physician Assistant

## 2018-06-29 ENCOUNTER — Encounter: Payer: Self-pay | Admitting: Physician Assistant

## 2018-06-29 ENCOUNTER — Telehealth: Payer: Self-pay

## 2018-06-29 VITALS — BP 118/70 | HR 77 | Ht 61.0 in | Wt 149.0 lb

## 2018-06-29 DIAGNOSIS — K51219 Ulcerative (chronic) proctitis with unspecified complications: Secondary | ICD-10-CM

## 2018-06-29 DIAGNOSIS — K625 Hemorrhage of anus and rectum: Secondary | ICD-10-CM | POA: Diagnosis not present

## 2018-06-29 MED ORDER — SULFASALAZINE 500 MG PO TABS: ORAL_TABLET | ORAL | 6 refills | Status: DC

## 2018-06-29 MED ORDER — MESALAMINE 1000 MG RE SUPP: 1000.0000 mg | Freq: Every day | RECTAL | 0 refills | Status: DC

## 2018-06-29 NOTE — Patient Instructions (Signed)
Your provider has requested that you go to the basement level for lab work before leaving today. Press "B" on the elevator. The lab is located at the first door on the left as you exit the elevator.  Increase your sulfasalazine 500 mg 2 tablets by mouth twice daily. A new prescription has been sent to your pharmacy.   Also we sent Canasa suppositories to your pharmacy.

## 2018-06-29 NOTE — Telephone Encounter (Signed)
Received fax from [facility] stating Canasa suppositories are not covered by her insurance. Amy asked for me to contact the pharmacy and find out if the enemas are covered. Called [facility] and they state the enemas are covered by insurance but still cost $372.54. Discussed options with Nicoletta Ba, PA and she states for me let patient know that the canasa suppositories are not covered and the enemas are too expensive. Also to tell the patient to see if the increase in sulfasalazine helps and to call back in 2 weeks and ask for Dr. Vena Rua nurse with an update on her symptoms. Patient agreed and verbalized understanding.

## 2018-06-29 NOTE — Progress Notes (Addendum)
Subjective:    Patient ID: Diana Brewer, female    DOB: 1934/05/08, 85 y.o.   MRN: [id number]  HPI Diana Brewer is a pleasant 85 year old white female, known to Dr. Hilarie Fredrickson who has long history of ulcerative proctosigmoiditis. Her last colonoscopy was done in 2011 per [name] with mild mucosal changes to 10 cm.  Biopsy showed only active chronic colitis rate also noted to have sigmoid diverticulosis. She has been maintained on sulfasalazine 500 mg, 2 tablets daily long-term. Comes in today because she has recently been seeing small amounts of bright red blood with her bowel movements.  This is been occurring over the past month.  Initially she was just seeing very small amount of streaky blood couple of days.  Started noticing intermittent blood on the tissue and occasionally some blood in the stool.  She has not had any change in bowel habits, no diarrhea or constipation.  She has no complaints of abdominal pain or rectal pain has had and "urge" sensation in her rectum.  Appetite has  been good, weight has been stable. She has not been on any aspirin or NSAIDs, no recent changes in meds antibiotics supplements etc.  She does mention that her family has been fighting respiratory viruses over the past couple of months. Interestingly over the past 4 to 5 days she has not noticed any blood in her stool. In the past she had used enemas and suppositories for exacerbations but has not required that in many years.   Review of Systems Pertinent positive and negative review of systems were noted in the above HPI section.  All other review of systems was otherwise negative.  Outpatient Encounter Medications as of 06/29/2018  Medication Sig  . ezetimibe (ZETIA) 10 MG tablet Take 1 tablet (10 mg total) by mouth daily.  . Multiple Vitamins-Minerals (CENTRUM SILVER) tablet Take 1 tablet by mouth daily.   . polyethylene glycol powder (GLYCOLAX/MIRALAX) powder Take 1/2 capful ( 9gm) dissolved in at least  8 oz. Of water or juice once daily  . propranolol (INDERAL) 40 MG tablet Take 1 tablet (40 mg total) by mouth 2 (two) times daily.  Marland Kitchen sulfaSALAzine (AZULFIDINE) 500 MG tablet Take 2 tablets by mouth twice daily  . [DISCONTINUED] hydrochlorothiazide (MICROZIDE) 12.5 MG capsule Take 1 capsule (12.5 mg total) by mouth every other day.  . [DISCONTINUED] sulfaSALAzine (AZULFIDINE) 500 MG tablet TAKE 2 TABLETS(1000 MG) BY MOUTH DAILY.  . mesalamine (CANASA) 1000 MG suppository Place 1 suppository (1,000 mg total) rectally at bedtime.   No facility-administered encounter medications on file as of 06/29/2018.    Allergies  Allergen Reactions  . Codeine Other (See Comments)    nausea    . Cortisone Swelling    Swelling   . Guaifenesin Er Other (See Comments)    Doesn't work, made her sick   Patient Active Problem List   Diagnosis Date Noted  . Medicare annual wellness visit, initial 04/28/2016  . Advanced care planning/counseling discussion 04/28/2016  . 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 Abnormal LFTs (liver function tests) 02/26/2013  . Benign hypertensive heart disease without heart failure 12/27/2012  . Colitis, ulcerative chronic ([place]) 07/08/2011  . Rapid palpitations 12/24/2010  . Hypercholesterolemia 12/24/2010  . ULCERATIVE PROCTITIS 10/14/2009  . MITRAL VALVE PROLAPSE, HX OF 10/14/2009   Social History   Socioeconomic History  . Marital status: Married    Spouse name: Not on file  . Number of children: Not on file  . Years of education: Not  on file  . Highest education level: Not on file  Occupational History  . Not on file  Social Needs  . Financial resource strain: Not on file  . Food insecurity:    Worry: Not on file    Inability: Not on file  . Transportation needs:    Medical: Not on file    Non-medical: Not on file  Tobacco Use  . Smoking status: Never Smoker  . Smokeless tobacco: Never Used  Substance and Sexual Activity  . Alcohol use: No  . Drug use: No  .  Sexual activity: Never  Lifestyle  . Physical activity:    Days per week: Not on file    Minutes per session: Not on file  . Stress: Not on file  Relationships  . Social connections:    Talks on phone: Not on file    Gets together: Not on file    Attends religious service: Not on file    Active member of club or organization: Not on file    Attends meetings of clubs or organizations: Not on file    Relationship status: Not on file  . Intimate partner violence:    Fear of current or ex partner: Not on file    Emotionally abused: Not on file    Physically abused: Not on file    Forced sexual activity: Not on file  Other Topics Concern  . Not on file  Social History Narrative   Lives with husband [name])   [place] child (daughter)   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[name]   Activity: walks and active in yard, grows vegetables   Diet: good water, good fruits/vegetables daily    Ms. Udovich's family history includes Cancer in her paternal grandfather; Heart disease in her mother; Hypertension in her mother; Melanoma in her brother and father; Rheum arthritis in her father; Stroke in her mother.      Objective:    Vitals:   06/29/18 1429  BP: 118/70  Pulse: 77    Physical Exam; developed elderly white female in no acute distress, pleasant height 5 foot 1 , weight 149.  BMI 28.  HEENT; nontraumatic normocephalic EOMI PERRLA sclera anicteric oral mucosa moist, Cardiovascular; regular rate and rhythm with S1-S2 no murmur rub or gallop, Pulmonary; clear bilaterally, Abdomen; soft, nontender nondistended bowel sounds are active, there is no palpable mass or hepatosplenomegaly , Rectal ;exam not done, Extremities; no clubbing cyanosis or edema skin warm dry, Neuropsych; large and oriented, grossly nonfocal mood and affect appropriate       Assessment & Plan:   #32 85 year old white female with long history of ulcerative proctosigmoiditis, generally maintained on Azulfidine  thousand milligrams daily who comes in with a to 4-week history of intermittent small-volume bright red blood on the tissue and in the stool.  She has not had any changes in her bowels and no complaints of abdominal pain or cramping.  She has had a vague sense of urgency.  Symptoms are consistent with a mild exacerbation of proctitis  Patient had in the past decided that she was not going to pursue any further colonoscopies due to age  #2 hypertension #3.  Hyperlipidemia #4.  Mild anemia  Plan; will increase Azulfidine to thousand milligrams twice daily, 2 tablets twice daily Check CBC with differential and sed rate today We also discussed starting a course of Canasa suppositories.  Unfortunately we do not have any samples.  I have sent a prescription for suppository nightly  x1 month. We will plan follow-up office visit with Dr. Hilarie Fredrickson in 4 to 5 weeks.   Addendum; Canasa suppositories were not covered by her insurance, Rowasa enemas are covered but will be more than $300.  Patient will be advised that if she does not wish to purchase the enemas we can see how she does with increased dose of Azulfidine over the next few weeks, and have asked her to call back and speak with Dr. Vena Rua nurse in 2 weeks with a progress report.  Aliza Moret Genia Harold PA-C 06/29/2018   Cc: Lucille Passy, MD   Addendum: Reviewed and agree with assessment and management plan. Would add folic acid 1 mg daily in addition to the above plan [name], [name], MD

## 2018-06-30 ENCOUNTER — Other Ambulatory Visit: Payer: Self-pay

## 2018-06-30 MED ORDER — FOLIC ACID 1 MG PO TABS: 1.0000 mg | ORAL_TABLET | Freq: Every day | ORAL | 11 refills | Status: DC

## 2018-06-30 NOTE — Progress Notes (Signed)
Patient is advised of the folic acid. Prescription transmitted to the pharmacy.Marland Kitchen

## 2018-07-03 ENCOUNTER — Other Ambulatory Visit: Payer: Medicare Other

## 2018-07-03 DIAGNOSIS — K51219 Ulcerative (chronic) proctitis with unspecified complications: Secondary | ICD-10-CM

## 2018-07-03 DIAGNOSIS — K625 Hemorrhage of anus and rectum: Secondary | ICD-10-CM

## 2018-07-03 LAB — CBC WITH DIFFERENTIAL/PLATELET
Abs Immature Granulocytes: 0.02 10*3/uL (ref 0.00–0.07)
BASOS ABS: 0.1 10*3/uL (ref 0.0–0.1)
Basophils Relative: 1 %
Eosinophils Absolute: 0.3 10*3/uL (ref 0.0–0.5)
Eosinophils Relative: 4 %
HEMATOCRIT: 34.3 % — AB (ref 36.0–46.0)
Hemoglobin: 10.9 g/dL — ABNORMAL LOW (ref 12.0–15.0)
Immature Granulocytes: 0 %
Lymphocytes Relative: 24 %
Lymphs Abs: 1.6 10*3/uL (ref 0.7–4.0)
MCH: 32.3 pg (ref 26.0–34.0)
MCHC: 31.8 g/dL (ref 30.0–36.0)
MCV: 101.8 fL — ABNORMAL HIGH (ref 80.0–100.0)
Monocytes Absolute: 0.7 10*3/uL (ref 0.1–1.0)
Monocytes Relative: 10 %
NEUTROS PCT: 61 %
Neutro Abs: 4.2 10*3/uL (ref 1.7–7.7)
Platelets: 342 10*3/uL (ref 150–400)
RBC: 3.37 MIL/uL — ABNORMAL LOW (ref 3.87–5.11)
RDW: 13.9 % (ref 11.5–15.5)
WBC: 7 10*3/uL (ref 4.0–10.5)
nRBC: 0 % (ref 0.0–0.2)

## 2018-07-03 LAB — SEDIMENTATION RATE: Sed Rate: 7 mm/hr (ref 0–22)

## 2018-07-05 ENCOUNTER — Telehealth: Payer: Self-pay | Admitting: Physician Assistant

## 2018-07-05 NOTE — Telephone Encounter (Signed)
Called patient and advised her okay to take folic acid 353 mcg.

## 2018-07-05 NOTE — Telephone Encounter (Signed)
Initiated PA on cover my meds. Informed patient that I will contact her to let her know once we received the response from [facility]. Patient verbalized understanding.

## 2018-07-05 NOTE — Telephone Encounter (Signed)
See previous phone note.  

## 2018-07-05 NOTE — Telephone Encounter (Signed)
Pt called to inform that she spoke with her insurance and was told that if doctor's office call they will cover [place]. Phone # to call is [phone]. Provide pt's DOB and ID # [id number].

## 2018-07-05 NOTE — Telephone Encounter (Signed)
Pt just called to inform that [facility] called her saying that they are going to approve canasa. Pt also said that her insurance will not cover folic acid because she can get it OTC. She said that she found OTC only 800 mcg, she wants to know if this is fine despite begin a less strength than the one prescribed. Amy is this ok?

## 2018-07-05 NOTE — Telephone Encounter (Signed)
Pt just called to inform that [facility] called her saying that they are going to approve canasa. Pt also said that her insurance will not cover folic acid because she can get it OTC. She said that she found OTC only 800 mcg, she wants to know if this is fine despite begin a less strength than the one prescribed.

## 2018-07-05 NOTE — Telephone Encounter (Signed)
Yes - 800 mcg daily is fine , and Im glad insurance will cover the Canasa - I would like her to use canasa at bedtime for the next 4 weeks

## 2018-07-05 NOTE — Telephone Encounter (Deleted)
Pt just called to inform that [facility] called her saying that they are going to approve canasa. Pt also said that her insurance will not cover folic acid because she can get it OTC. She said that she found OTC only 800 mcg, she wants to know if this is fine despite begin a less strength than the one prescribed.

## 2018-07-06 NOTE — Telephone Encounter (Signed)
Dr Hilarie Fredrickson will you please advise in [name]'s absence?

## 2018-07-06 NOTE — Telephone Encounter (Signed)
Left information on her voicemail. 

## 2018-07-06 NOTE — Telephone Encounter (Signed)
See previous phone note.  

## 2018-07-06 NOTE — Telephone Encounter (Signed)
Given the expense of mesalamine enema and suppository I would hold/not purchase this medicine for now and see how she responds to sulfasalazine only. Have her update [place] after 2 to 3 weeks of sulfasalazine therapy regarding her symptoms

## 2018-07-06 NOTE — Telephone Encounter (Signed)
Pt called advised that she has a $300.00 CO-pay and would like to know if it is really worth it taking the med since she will have to pay out of pocket. #[phone] to reach her.

## 2018-07-06 NOTE — Telephone Encounter (Signed)
Diana Brewer how do you want to advise?

## 2018-07-06 NOTE — Telephone Encounter (Addendum)
Per [facility] Diana Brewer: Pt called advised that she has a $300.00 CO-pay and would like to know if it is really worth it taking the med since she will have to pay out of pocket. #[phone] to reach her.

## 2018-07-31 ENCOUNTER — Other Ambulatory Visit: Payer: Self-pay

## 2018-07-31 ENCOUNTER — Telehealth: Payer: Self-pay | Admitting: Internal Medicine

## 2018-07-31 MED ORDER — MESALAMINE 4 G RE ENEM: 4.0000 g | ENEMA | Freq: Every day | RECTAL | 0 refills | Status: DC

## 2018-07-31 NOTE — Telephone Encounter (Signed)
Okay for Rowasa enemas, 1 enema nightly If not improving in short order, 5 to 7 days, we need to either have a virtual visit, see her in clinic and/or consider flexible sigmoidoscopy (COVID-19 decision making)

## 2018-07-31 NOTE — Telephone Encounter (Signed)
Spoke with pt and she is aware, script sent to pharmacy.

## 2018-07-31 NOTE — Telephone Encounter (Signed)
Pt calling stating she is still having rectal bleeding since increasing sulfasalazine. Pt is calling requesting Rowasa enemas be called in, states they have worked for her in the past. She knows they are expensive but is requesting them. Please advise.

## 2018-07-31 NOTE — Telephone Encounter (Signed)
Pt called in and stated that she is having some rectal bleeding and will like to speak with the nurse.

## 2018-08-04 ENCOUNTER — Telehealth: Payer: Self-pay

## 2018-08-04 NOTE — Telephone Encounter (Signed)
Spoke with pt about moving her appt from 4/3 to 4/2. She states she does not want to come in for a face to face visit. She would like to have a virtual visit and states she could go to her daughters house for this. Please advise.

## 2018-08-07 NOTE — Telephone Encounter (Signed)
Pt ok with virtual visit will use ZOOM. Please use cell ph#: [phone]

## 2018-08-07 NOTE — Telephone Encounter (Signed)
Ok for virtual visit We may be using a different platform, webex okay if that is available to her

## 2018-08-10 ENCOUNTER — Encounter: Payer: Self-pay | Admitting: Internal Medicine

## 2018-08-10 ENCOUNTER — Other Ambulatory Visit: Payer: Self-pay

## 2018-08-10 ENCOUNTER — Ambulatory Visit (INDEPENDENT_AMBULATORY_CARE_PROVIDER_SITE_OTHER): Payer: Medicare Other | Admitting: Internal Medicine

## 2018-08-10 DIAGNOSIS — K51311 Ulcerative (chronic) rectosigmoiditis with rectal bleeding: Secondary | ICD-10-CM

## 2018-08-10 DIAGNOSIS — M25571 Pain in right ankle and joints of right foot: Secondary | ICD-10-CM | POA: Diagnosis not present

## 2018-08-10 MED ORDER — MESALAMINE 4 G RE ENEM: 4.0000 g | ENEMA | Freq: Every day | RECTAL | 2 refills | Status: DC

## 2018-08-10 NOTE — Progress Notes (Signed)
Subjective:    Patient ID: Diana Brewer, female    DOB: Oct 08, 1933, 85 y.o.   MRN: [id number]  This service was provided via telemedicine/Zoom platform.   The patient was located at home The provider was located in GI office The patient did consent to this telephone visit and is aware of possible charges through their insurance for this visit.   The other persons participating in this telemedicine service were me and patient  Time spent on call: 25 min   HPI Silvano Rusk is an 85 year old female with a history of distal proctosigmoiditis, mild constipation, hyperlipidemia, mitral valve prolapse, hypertension who is seen virtually for follow-up.  She was seen in the office on 06/29/2018 by Nicoletta Ba, PA-C.  At that time she was coming in because she had seen small amounts of bright red blood with bowel movements.  She had been previously maintained on sulfasalazine daily.  She did have an urge sensation in her rectum at that time..  Amy's recommendation at that time was to increase sulfasalazine to 2000 mg twice daily and start a course of Canasa suppository.  Georgiann Mccoy was not covered by her insurance and Rowasa was also going to be very expensive.  Thus we waited to see if sulfasalazine being increased alone would provide the necessary benefit.  On 07/31/2018 she called back stating she was still having rectal bleeding despite increase sulfasalazine and Rowasa enemas were ordered.  She reports that since starting the Rowasa enema her rectal bleeding has gotten much better.  She is also had improvement in her rectal pressure and tenesmus symptom.  She used 4 days of the full volume/4 g Rowasa dose but over the last several days has been using a half dose daily.  She reports she previously used half dose when [name] wrote this medication for her.  She has continued the sulfasalazine 1 g twice daily.  She denies other abdominal pain.  She is noticed some pain under her left foot but no  swelling.  This is a soreness.  There is been no trauma or bruising.  She also noticed some left-sided discomfort which she feels relates to having to contort her body while putting in the enema.  No fevers or chills.  No upper GI or hepatobiliary complaint today.  No respiratory complaint today.  She was found to be slightly anemic when seen by [name] back in February.  Hemoglobin was 10.9 which was slightly below her baseline.  She had a macrocytosis likely related to sulfasalazine.  Folic acid has been started since this time.  Her ESR was normal.  Review of Systems As per HPI, otherwise negative  Current Medications, Allergies, Past Medical History, Past Surgical History, Family History and Social History were reviewed in [employer] record.     Objective:   Physical Exam  Not performed as this is a virtual visit  CBC    Component Value Date/Time   WBC 7.0 07/03/2018 0909   RBC 3.37 (L) 07/03/2018 0909   HGB 10.9 (L) 07/03/2018 0909   HGB 11.6 08/09/2017 0826   HCT 34.3 (L) 07/03/2018 0909   HCT 32.7 (L) 08/09/2017 0826   PLT 342 07/03/2018 0909   PLT 303 08/09/2017 0826   MCV 101.8 (H) 07/03/2018 0909   MCV 93 08/09/2017 0826   MCH 32.3 07/03/2018 0909   MCHC 31.8 07/03/2018 0909   RDW 13.9 07/03/2018 0909   RDW 14.2 08/09/2017 0826   LYMPHSABS 1.6 07/03/2018  0909   LYMPHSABS 1.8 08/09/2017 0826   MONOABS 0.7 07/03/2018 0909   EOSABS 0.3 07/03/2018 0909   EOSABS 0.3 08/09/2017 0826   BASOSABS 0.1 07/03/2018 0909   BASOSABS 0.1 08/09/2017 0826   CMP     Component Value Date/Time   NA 143 02/21/2018 0819   K 5.1 02/21/2018 0819   CL 103 02/21/2018 0819   CO2 25 02/21/2018 0819   GLUCOSE 91 02/21/2018 0819   GLUCOSE 85 03/10/2016 0814   BUN 31 (H) 02/21/2018 0819   CREATININE 1.61 (H) 02/21/2018 0819   CREATININE 1.24 (H) 03/10/2016 0814   CALCIUM 9.6 02/21/2018 0819   PROT 5.9 (L) 02/21/2018 0819   ALBUMIN 4.0 02/21/2018 0819    AST 19 02/21/2018 0819   ALT 16 02/21/2018 0819   ALKPHOS 76 02/21/2018 0819   BILITOT 0.3 02/21/2018 0819   GFRNONAA 30 (L) 02/21/2018 0819   GFRAA 34 (L) 02/21/2018 0819   ESR 7     Assessment & Plan:  85 year old female with a history of distal proctosigmoiditis, mild constipation, hyperlipidemia, mitral valve prolapse, hypertension who is seen virtually for follow-up.    1.  Ulcerative procto distal sigmoiditis --improving since starting Rowasa enemas.  She will continue sulfasalazine 1 g twice daily.  We discussed increasing this to 1 g 3 times daily but she prefers to avoid this if possible as she is worried about possible hepatotoxicity.  Rowasa has definitively been helpful and I would like her to continue this if possible.  I explained that she may need the full dose if she is having any rectal bleeding or proctitis symptoms.  We looked on good Rx and found that [facility] offers 28 doses $115 which she says is affordable.  We will therefore send this prescription to [facility]. --Continue sulfasalazine 1 g twice daily with daily folic acid --Rowasa enemas one half dose to full dose nightly for at least 8 weeks --please send this prescription to [facility] on [address] in [place] follow-up in about 8 weeks to see how she is doing, either virtually or in person  2.  Right foot pain --not felt secondary to mesalamine.  If this continues she was advised to contact her primary care.  25 minutes spent with the patient today. Greater than 50% was spent in counseling and coordination of care with the patient

## 2018-08-10 NOTE — Addendum Note (Signed)
Addended by: Larina Bras on: 08/10/2018 03:31 PM   Modules accepted: Orders

## 2018-08-10 NOTE — Patient Instructions (Signed)
Please continue sulfasalazine 1 gram twice daily  Continue Folic acid daily.  We have sent the following medications to your pharmacy for you to pick up at your convenience: Rowasa enema-1/2-1 enema nightly for at least 8 weeks.  Please follow up with Dr Hilarie Fredrickson in 8 weeks, either virtually, or in person. You may call our office to set this up.

## 2018-08-11 ENCOUNTER — Ambulatory Visit: Payer: Medicare Other | Admitting: Internal Medicine

## 2018-09-04 ENCOUNTER — Other Ambulatory Visit: Payer: Self-pay

## 2018-09-04 ENCOUNTER — Encounter: Payer: Self-pay | Admitting: *Deleted

## 2018-09-04 DIAGNOSIS — I10 Essential (primary) hypertension: Secondary | ICD-10-CM

## 2018-09-04 DIAGNOSIS — E785 Hyperlipidemia, unspecified: Secondary | ICD-10-CM

## 2018-09-04 DIAGNOSIS — E7800 Pure hypercholesterolemia, unspecified: Secondary | ICD-10-CM

## 2018-09-04 DIAGNOSIS — Z79899 Other long term (current) drug therapy: Secondary | ICD-10-CM

## 2018-09-04 NOTE — Telephone Encounter (Signed)
-----   Message from Theodoro Parma, RN sent at 09/04/2018  4:17 PM EDT ----- Regarding: labs for tomorrow Lanny Hurst asked me about labs for tomorrow. There is an appointment but no orders. I also couldn't locate where the labs were ordered. Almyra Free will you look at this? Thank you! Valetta Fuller

## 2018-09-04 NOTE — Telephone Encounter (Signed)
This encounter was created in error - please disregard.

## 2018-09-05 ENCOUNTER — Other Ambulatory Visit: Payer: Medicare Other

## 2018-09-07 ENCOUNTER — Other Ambulatory Visit: Payer: Self-pay

## 2018-09-07 MED ORDER — PROPRANOLOL HCL 40 MG PO TABS: 40.0000 mg | ORAL_TABLET | Freq: Two times a day (BID) | ORAL | 1 refills | Status: DC

## 2018-09-08 ENCOUNTER — Telehealth: Payer: Self-pay | Admitting: Cardiovascular Disease

## 2018-09-08 NOTE — Telephone Encounter (Signed)
Spoke to pt. She stated she had a telphone visit with her colon doctor and had some blood work done which should be in the chart if Dr. Claiborne Billings wants to review it. Pt stated she is not having any cardiac symptoms or problems and stated her BP has been great. She stated she has been doing everything Dr. Claiborne Billings wanted her to do and agreed to reschedule. She has rescheduled to 01/29/19 at 8:40 AM. She stated she will call if she thinks she needs to be seen sooner.

## 2018-09-11 ENCOUNTER — Telehealth: Payer: Medicare Other | Admitting: Cardiovascular Disease

## 2019-01-18 ENCOUNTER — Other Ambulatory Visit: Payer: Self-pay | Admitting: Physician Assistant

## 2019-01-26 ENCOUNTER — Other Ambulatory Visit: Payer: Self-pay

## 2019-01-26 ENCOUNTER — Other Ambulatory Visit: Payer: Medicare Other | Admitting: *Deleted

## 2019-01-26 ENCOUNTER — Encounter (INDEPENDENT_AMBULATORY_CARE_PROVIDER_SITE_OTHER): Payer: Self-pay

## 2019-01-26 DIAGNOSIS — I10 Essential (primary) hypertension: Secondary | ICD-10-CM | POA: Diagnosis not present

## 2019-01-26 DIAGNOSIS — Z79899 Other long term (current) drug therapy: Secondary | ICD-10-CM | POA: Diagnosis not present

## 2019-01-26 DIAGNOSIS — E785 Hyperlipidemia, unspecified: Secondary | ICD-10-CM | POA: Diagnosis not present

## 2019-01-26 LAB — COMPREHENSIVE METABOLIC PANEL
ALT: 18 IU/L (ref 0–32)
AST: 22 IU/L (ref 0–40)
Albumin/Globulin Ratio: 2.5 — ABNORMAL HIGH (ref 1.2–2.2)
Albumin: 4.3 g/dL (ref 3.6–4.6)
Alkaline Phosphatase: 83 IU/L (ref 39–117)
BUN/Creatinine Ratio: 15 (ref 12–28)
BUN: 24 mg/dL (ref 8–27)
Bilirubin Total: 0.3 mg/dL (ref 0.0–1.2)
CO2: 24 mmol/L (ref 20–29)
Calcium: 9.8 mg/dL (ref 8.7–10.3)
Chloride: 104 mmol/L (ref 96–106)
Creatinine, Ser: 1.6 mg/dL — ABNORMAL HIGH (ref 0.57–1.00)
GFR calc Af Amer: 34 mL/min/{1.73_m2} — ABNORMAL LOW (ref >59)
GFR calc non Af Amer: 30 mL/min/{1.73_m2} — ABNORMAL LOW (ref >59)
Globulin, Total: 1.7 g/dL (ref 1.5–4.5)
Glucose: 88 mg/dL (ref 65–99)
Potassium: 5.3 mmol/L — ABNORMAL HIGH (ref 3.5–5.2)
Sodium: 142 mmol/L (ref 134–144)
Total Protein: 6 g/dL (ref 6.0–8.5)

## 2019-01-26 LAB — CBC
Hematocrit: 31 % — ABNORMAL LOW (ref 34.0–46.6)
Hemoglobin: 10.9 g/dL — ABNORMAL LOW (ref 11.1–15.9)
MCH: 34.3 pg — ABNORMAL HIGH (ref 26.6–33.0)
MCHC: 35.2 g/dL (ref 31.5–35.7)
MCV: 98 fL — ABNORMAL HIGH (ref 79–97)
Platelets: 331 10*3/uL (ref 150–450)
RBC: 3.18 x10E6/uL — ABNORMAL LOW (ref 3.77–5.28)
RDW: 11.8 % (ref 11.7–15.4)
WBC: 7.9 10*3/uL (ref 3.4–10.8)

## 2019-01-26 LAB — LIPID PANEL
Chol/HDL Ratio: 3.2 ratio (ref 0.0–4.4)
Cholesterol, Total: 184 mg/dL (ref 100–199)
HDL: 58 mg/dL (ref >39)
LDL Chol Calc (NIH): 111 mg/dL — ABNORMAL HIGH (ref 0–99)
Triglycerides: 80 mg/dL (ref 0–149)
VLDL Cholesterol Cal: 15 mg/dL (ref 5–40)

## 2019-01-26 LAB — TSH: TSH: 6.38 u[IU]/mL — ABNORMAL HIGH (ref 0.450–4.500)

## 2019-01-29 ENCOUNTER — Encounter: Payer: Self-pay | Admitting: Cardiovascular Disease

## 2019-01-29 ENCOUNTER — Other Ambulatory Visit: Payer: Self-pay

## 2019-01-29 ENCOUNTER — Ambulatory Visit (INDEPENDENT_AMBULATORY_CARE_PROVIDER_SITE_OTHER): Payer: Medicare Other | Admitting: Cardiovascular Disease

## 2019-01-29 VITALS — BP 169/76 | HR 72 | Ht 61.0 in | Wt 147.0 lb

## 2019-01-29 DIAGNOSIS — N183 Chronic kidney disease, stage 3 unspecified: Secondary | ICD-10-CM

## 2019-01-29 DIAGNOSIS — R002 Palpitations: Secondary | ICD-10-CM | POA: Diagnosis not present

## 2019-01-29 DIAGNOSIS — I6523 Occlusion and stenosis of bilateral carotid arteries: Secondary | ICD-10-CM

## 2019-01-29 DIAGNOSIS — I10 Essential (primary) hypertension: Secondary | ICD-10-CM

## 2019-01-29 DIAGNOSIS — E785 Hyperlipidemia, unspecified: Secondary | ICD-10-CM | POA: Diagnosis not present

## 2019-01-29 NOTE — Patient Instructions (Signed)
Medication Instructions:  The current medical regimen is effective;  continue present plan and medications.  If you need a refill on your cardiac medications before your next appointment, please call your pharmacy.   Follow-Up: At [facility], you and your health needs are our priority.  As part of our continuing mission to provide you with exceptional heart care, we have created designated Provider Care Teams.  These Care Teams include your primary Cardiologist (physician) and Advanced Practice Providers (APPs -  Physician Assistants and Nurse Practitioners) who all work together to provide you with the care you need, when you need it. You will need a follow up appointment in 12 months.  Please call our office 2 months in advance to schedule this appointment.  You may see [name], MD or one of the following Advanced Practice Providers on your designated Care Team: [place], [place] . Fabian Sharp, PA-C

## 2019-01-29 NOTE — Progress Notes (Addendum)
Cardiology Office Note    Date:  01/29/2019   ID:  CHYAN CARNERO, DOB 21-Apr-1934, MRN [id number]  PCP:  Lucille Passy, MD  Cardiologist:  [name], MD   F/U evaluation  History of Present Illness:  Diana Brewer is a 85 y.o. female who has been followed by Dr. Mare Ferrari almost 30 years.  I have cared for several of her family members. She established cardiology care with me in October 2017.  I last saw her in April 2019.  She presents for follow-up evaluation.  Ms.Dornfeld has a long-standing history of hypertension, as well as mitral valve prolapse.  She has a history of hyperlipidemia, as well as chronic inflammatory proctitis.  She has been maintained on propranolol 20 mg twice a day for palpitations and at times has to take extra pill for breakthrough ectopy.  She has complained that her weight has been gradually increasing.  Dr. Mare Ferrari had previously checked a TSH.  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  She denies any recent episodes of chest pain.  She denies any change in development of shortness of breath.    When I  saw her, since she was taken propranolol 20 mg twice a day, but at times needed additional doses due to recurrent palpitations  I suggested substituting this for Toprol-XL 25 mg daily with plans to titrate as needed.  However, on the metoprolol preparation she felt that she developed some recurrent migraine headaches, which she had not experienced while she was taken propranolol.  She was switched back to propanolol 40 mg bid which has beneficial with reference to her migraine headaches on this increased beta blocker dose.  Her palpitations also have improved. When  I initially evaluated her, she had a soft carotid bruit on physical exam.  She underwent carotid duplex imaging on 07/21/2016.  This revealed mild heterogenous plaque bilaterally with narrowings in the 1-39% range.  She had normal  subclavian and vertebral arteries bilaterally.  Laboratory in August 2017 had shown an LDL cholesterol of 110 and  in February 2017 LDL was 138.    When I  saw her in March 2018, I reviewed recent subclinical atherosclerosis date on a systematic patients without cardiac risk factors.  His cast development of sclerosis which was found to develop even at LDLs, beginning at 60.  She was hasn't to try a statin.  I instituted Zetia 10 mg.  She has tolerated this well.  She underwent recent laboratory which now showed improvement in her lipid studies such that total cholesterol was 143, triglycerides 96, HDL 52, LDL 72.  Creatinine was 1.24 which had improved from 1.37.    I last saw her in April 2019 at which time she denied any chest pain or palpitations.  She continued to take propranolol 40 mg twice a day and was taking HCTZ 12.5 mg every other day.  Apparently, she has been cutting her Zetia 10 mg in half.  Laboratory one week previously has shown a creatinine of 1.25 which was slightly improved from 2 weeks ago when it was 1.41.  At that time, her HCTZ was reduced to every other day. Lipid studies have shown her LDL cholesterol increased to 97 when 7 months ago it was 72.   Since I last saw her, she is no longer taking hydrochlorothiazide.  Her palpitations have remained controlled on propranolol 40 mg twice a day.  She continues  to take Zetia 10 mg for hyperlipidemia for her subclinical atherosclerosis.  Over the past 6 months as result of the COVID-19 pandemic, she has not been exercising as she had in the past.  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  She states her blood pressure at home typically runs between 117 and 128.  She denies chest pain PND orthopnea, presyncope or syncope.  She presents for evaluation.  Past Medical History:  Diagnosis Date  . Arthritis 2016   R wrist predominantly radius  scaphoid and midcarpal [name]  . History of migraine remote  . History of shingles   . HLD (hyperlipidemia)   . Hypertension   . Macular degeneration    Diana Brewer  . MVP (mitral valve prolapse)   . Palpitations   . Postherpetic neuralgia 12/27/2012  . 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)    Past Surgical History:  Procedure Laterality Date  . BREAST BIOPSY  1975  . COLONOSCOPY  2011   [facility]  . TRACHEOSTOMY  1942   due to Strep Throat (?epiglottitis)    Current Medications: Outpatient Medications Prior to Visit  Medication Sig Dispense Refill  . ezetimibe (ZETIA) 10 MG tablet Take 1 tablet (10 mg total) by mouth daily. 90 tablet 1  . Multiple Vitamins-Minerals (CENTRUM SILVER) tablet Take 1 tablet by mouth daily.     . polyethylene glycol powder (GLYCOLAX/MIRALAX) powder Take 1/2 capful ( 9gm) dissolved in at least 8 oz. Of water or juice once daily 527 g 3  . propranolol (INDERAL) 40 MG tablet Take 1 tablet (40 mg total) by mouth 2 (two) times daily. 180 tablet 1  . sulfaSALAzine (AZULFIDINE) 500 MG tablet TAKE 2 TABLETS BY MOUTH TWICE A DAY 144 tablet 2  . folic acid (FOLVITE) 1 MG tablet Take 1 tablet (1 mg total) by mouth daily. (Patient not taking: Reported on 01/29/2019) 30 tablet 11  . mesalamine (CANASA) 1000 MG suppository Place 1 suppository (1,000 mg total) rectally at bedtime. 30 suppository 0  . mesalamine (ROWASA) 4 g enema Place 60 mLs (4 g total) rectally at bedtime. (Patient not taking: Reported on 01/29/2019) 7 Bottle 0  . mesalamine (ROWASA) 4 g enema Place 60 mLs (4 g total) rectally at bedtime. (Patient not taking: Reported on 01/29/2019) 30 Bottle 2   No facility-administered medications prior to visit.      Allergies:   Codeine, Cortisone, and Guaifenesin er   Social History   Socioeconomic History  . Marital status: Married    Spouse name: Not on file  . Number of children: Not on file  . Years of education: Not  on file  . Highest education level: Not on file  Occupational History  . Not on file  Social Needs  . Financial resource strain: Not on file  . Food insecurity    Worry: Not on file    Inability: Not on file  . Transportation needs    Medical: Not on file    Non-medical: Not on file  Tobacco Use  . Smoking status: Never Smoker  . Smokeless tobacco: Never Used  Substance and Sexual Activity  . Alcohol use: No  . Drug use: No  . Sexual activity: Never  Lifestyle  . Physical activity    Days per week: Not on file    Minutes per session: Not on  file  . Stress: Not on file  Relationships  . Social Herbalist on phone: Not on file    Gets together: Not on file    Attends religious service: Not on file    Active member of club or organization: Not on file    Attends meetings of clubs or organizations: Not on file    Relationship status: Not on file  Other Topics Concern  . Not on file  Social History Narrative   Lives with husband [name])   [place] child (daughter)   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[name]   Activity: walks and active in yard, grows vegetables   Diet: good water, good fruits/vegetables daily     Family History  family history includes Cancer in her paternal grandfather; Heart disease in her mother; Hypertension in her mother; Melanoma in her brother and father; Rheum arthritis in her father; Stroke in her mother.   ROS General: Negative; No fevers, chills, or night sweats;  HEENT: Negative; No changes in vision or hearing, sinus congestion, difficulty swallowing Pulmonary: Negative; No cough, wheezing, shortness of breath, hemoptysis Cardiovascular: Positive for MVP, hypertension, and intermittent palpitations GI: Negative; No nausea, vomiting, diarrhea, or abdominal pain GU: Negative; No dysuria, hematuria, or difficulty voiding Musculoskeletal: Negative; no myalgias, joint pain, or weakness Hematologic/Oncology: Negative; no easy  bruising, bleeding Endocrine: Negative; no heat/cold intolerance; no diabetes Neuro: Negative; no changes in balance, headaches Skin: Negative; No rashes or skin lesions Psychiatric: Negative; No behavioral problems, depression Sleep: Negative; No snoring, daytime sleepiness, hypersomnolence, bruxism, restless legs, hypnogognic hallucinations, no cataplexy Other comprehensive 14 point system review is negative.   PHYSICAL EXAM:   VS:  BP (!) 169/76   Pulse 72   Ht 5' 1"  (1.549 m)   Wt 147 lb (66.7 kg)   BMI 27.78 kg/m     Repeat blood pressure by me was significantly improved at 122/74, which is assistant with her home readings  Wt Readings from Last 3 Encounters:  01/29/19 147 lb (66.7 kg)  06/29/18 149 lb (67.6 kg)  02/27/18 146 lb 6.4 oz (66.4 kg)    General: Alert, oriented, no distress.  Skin: normal turgor, no rashes, warm and dry HEENT: Normocephalic, atraumatic. Pupils equal round and reactive to light; sclera anicteric; extraocular muscles intact;  Nose without nasal septal hypertrophy Mouth/Parynx benign; Mallinpatti scale 2 Neck: No JVD, no carotid bruits; normal carotid upstroke Lungs: clear to ausculatation and percussion; no wheezing or rales Chest wall: without tenderness to palpitation Heart: PMI not displaced, RRR, s1 s2 normal, 1/6 systolic murmur, apical systolic click, no diastolic murmur, no rubs, gallops, thrills, or heaves Abdomen: soft, nontender; no hepatosplenomehaly, BS+; abdominal aorta nontender and not dilated by palpation. Back: no CVA tenderness Pulses 2+ Musculoskeletal: full range of motion, normal strength, no joint deformities Extremities: no clubbing cyanosis or edema, Homan's sign negative  Neurologic: grossly nonfocal; Cranial nerves grossly wnl Psychologic: Normal mood and affect    Studies/Labs Reviewed:   ECG (independently read by me): Normal sinus rhythm at 72 bpm.  Normal intervals.  No ectopy.  April 2019 ECG (independently  read by me): Normal sinus rhythm at 74 bpm.  Normal intervals.  Nonspecific T changes.  October 2018 ECG (independently read by me): Normal sinus rhythm at 66 bpm.  No ectopy.  Normal intervals.  March 2018 ECG (independently read by me): NSR at 80; Normal intervals. QTc 405 msec. No ST changes.  January 2018 ECG (independently read  by me): Normal sinus rhythm at 85 bpm.  Nonspecific T changes.  QTc interval 445 ms.  October 2017 ECG (independently read by me): Normal sinus rhythm at 63 bpm.  No ectopy.  Normal intervals.  Recent Labs: BMP Latest Ref Rng & Units 02/21/2018 08/17/2017 08/09/2017  Glucose 65 - 99 mg/dL 91 88 91  BUN 8 - 27 mg/dL 31(H) 19 22  Creatinine 0.57 - 1.00 mg/dL 1.61(H) 1.25(H) 1.41(H)  BUN/Creat Ratio 12 - 28 19 15 16   Sodium 134 - 144 mmol/L 143 143 143  Potassium 3.5 - 5.2 mmol/L 5.1 4.6 5.4(H)  Chloride 96 - 106 mmol/L 103 102 104  CO2 20 - 29 mmol/L 25 26 26   Calcium 8.7 - 10.3 mg/dL 9.6 9.3 9.6     Hepatic Function Latest Ref Rng & Units 02/21/2018 08/09/2017 12/28/2016  Total Protein 6.0 - 8.5 g/dL 5.9(L) 6.0 5.9(L)  Albumin 3.5 - 4.7 g/dL 4.0 4.1 4.0  AST 0 - 40 IU/L 19 20 20   ALT 0 - 32 IU/L 16 16 17   Alk Phosphatase 39 - 117 IU/L 76 76 75  Total Bilirubin 0.0 - 1.2 mg/dL 0.3 0.4 0.2  Bilirubin, Direct <=0.2 mg/dL - - -    CBC Latest Ref Rng & Units 07/03/2018 08/09/2017 12/28/2016  WBC 4.0 - 10.5 K/uL 7.0 7.3 6.7  Hemoglobin 12.0 - 15.0 g/dL 10.9(L) 11.6 10.9(L)  Hematocrit 36.0 - 46.0 % 34.3(L) 32.7(L) 32.5(L)  Platelets 150 - 400 K/uL 342 303 314   Lab Results  Component Value Date   MCV 101.8 (H) 07/03/2018   MCV 93 08/09/2017   MCV 97 12/28/2016   Lab Results  Component Value Date   TSH 3.97 12/09/2015   No results found for: HGBA1C   BNP No results found for: BNP  ProBNP No results found for: PROBNP   Lipid Panel     Component Value Date/Time   CHOL 164 02/21/2018 0819   TRIG 76 02/21/2018 0819   HDL 52 02/21/2018 0819    CHOLHDL 3.2 02/21/2018 0819   CHOLHDL 3.4 12/09/2015 0758   VLDL 21 12/09/2015 0758   LDLCALC 97 02/21/2018 0819   LDLDIRECT 132.6 12/27/2012 1146     RADIOLOGY: No results found.   Additional studies/ records that were reviewed today include:  I reviewed previous records by Dr. Mare Ferrari. Recent laboratory was reviewed  ASSESSMENT:    1. Essential hypertension   2. Hyperlipidemia with target LDL less than 70   3. palpitations   4. Carotid artery plaque, bilateral   5. Stage 3 chronic kidney disease [facility])     PLAN:  Ms Egelston is an 85 year old female who has a history of mitral valve prolapse, hypertension, and palpitations.  Remotely she had been treated with propranolol 20 mg twice a day for many years but had developed recurrent palpitations on this therapy.  She did not tolerate the substitution to Toprol-XL and has been back on propranolol now with controlled palpitations on an increased dose at 40 mg twice a day.  Her ECG today is stable and shows sinus rhythm at 72 without ectopy.  She has normal intervals.  There is no chest pain.  The previous Doppler study had suggested mild heterogeneous plaque bilaterally in her carotids.  Because of this underlying subclinical atherosclerosis I had recommended initiation of lipid-lowering therapy and she has tolerated Zetia 10 mg daily without side effects.  In October 2019 her LDL was 97.  She underwent repeat laboratory last week, current  results are still pending.  She is no longer on HCTZ and in October 2019 serum creatinine had increased to 1.6.  We will see what her follow-up renal profile looks like her stage III chronic kidney disease.  She has mitral valve prolapse which is stable.  She notes only rare episodes of some mild transient left ankle swelling which is not present today.  I discussed with her trying to improve her activity level doing this COVID pandemic by at least walking in her neighborhood, preferably 5 days/week for at  least 30 minutes if at all possible.  I will contact her regarding her laboratory.  I will see her in 1 year for reevaluation or sooner if problems arise.   Medication Adjustments/Labs and Tests Ordered: Current medicines are reviewed at length with the patient today.  Concerns regarding medicines are outlined above.  Medication changes, Labs and Tests ordered today are listed in the Patient Instructions below. Patient Instructions  Medication Instructions:  The current medical regimen is effective;  continue present plan and medications.  If you need a refill on your cardiac medications before your next appointment, please call your pharmacy.   Follow-Up: At [facility], you and your health needs are our priority.  As part of our continuing mission to provide you with exceptional heart care, we have created designated Provider Care Teams.  These Care Teams include your primary Cardiologist (physician) and Advanced Practice Providers (APPs -  Physician Assistants and Nurse Practitioners) who all work together to provide you with the care you need, when you need it. You will need a follow up appointment in 12 months.  Please call our office 2 months in advance to schedule this appointment.  You may see [name], MD or one of the following Advanced Practice Providers on your designated Care Team: [place], [place] . Fabian Sharp, PA-C      Signed, [name], MD, [facility] 01/29/2019 9:23 AM    [facility] [address], [place], [place], [place]  [address] Phone: [phone]

## 2019-02-09 NOTE — Addendum Note (Signed)
Addended by: Caprice Beaver T on: 02/09/2019 10:49 AM   Modules accepted: Orders

## 2019-02-13 ENCOUNTER — Telehealth: Payer: Self-pay | Admitting: Cardiovascular Disease

## 2019-02-13 NOTE — Telephone Encounter (Signed)
Patient called for her lab results.

## 2019-02-13 NOTE — Telephone Encounter (Signed)
Attempted to contact patient- number does not ring, just hangs up the call. Will try again.

## 2019-02-27 DIAGNOSIS — H2513 Age-related nuclear cataract, bilateral: Secondary | ICD-10-CM | POA: Diagnosis not present

## 2019-02-27 DIAGNOSIS — H524 Presbyopia: Secondary | ICD-10-CM | POA: Diagnosis not present

## 2019-02-27 DIAGNOSIS — H353131 Nonexudative age-related macular degeneration, bilateral, early dry stage: Secondary | ICD-10-CM | POA: Diagnosis not present

## 2019-03-10 ENCOUNTER — Other Ambulatory Visit: Payer: Self-pay | Admitting: Adult Health

## 2019-03-11 DIAGNOSIS — Z8616 Personal history of COVID-19: Secondary | ICD-10-CM | POA: Insufficient documentation

## 2019-03-12 ENCOUNTER — Other Ambulatory Visit: Payer: Self-pay

## 2019-03-12 MED ORDER — EZETIMIBE 10 MG PO TABS: 10.0000 mg | ORAL_TABLET | Freq: Every day | ORAL | 3 refills | Status: DC

## 2019-04-12 ENCOUNTER — Other Ambulatory Visit: Payer: Self-pay

## 2019-04-12 ENCOUNTER — Telehealth: Payer: Self-pay | Admitting: Internal Medicine

## 2019-04-12 MED ORDER — ONDANSETRON 4 MG PO TBDP: ORAL_TABLET | ORAL | 0 refills | Status: DC

## 2019-04-12 NOTE — Telephone Encounter (Signed)
Pt states she has covid and has had it for about 2 weeks now. Reports she has nausea and just does not feel like eating much. She is not taking her miralax as she just cannot swallow it right now due to the nausea. Pt is requesting something be called in for nausea and what else Dr. Hilarie Fredrickson thinks she can do for the constipation. States her husband also has covid. Please advise.

## 2019-04-12 NOTE — Telephone Encounter (Signed)
Pt reported that she has been feeling nauseous and constipated and currently experiencing rectal bleeding.  She would like to discuss medications and requested a prescription to treat nausea.

## 2019-04-12 NOTE — Telephone Encounter (Signed)
Sorry to hear that she has COVID-19 GI symptoms are quite common with this virus.  Others have mentioned the constipation associated with this infection  I would add zofran 4 mg ODT every 6-8 hours PRN nausea She can try senokot (senna) 1-2 tablets daily for constipation (the pill may be easier to tolerate than the liquid needed to dissolve miralax  She can let me know if symptoms are worsening or the above isn't helping

## 2019-04-12 NOTE — Telephone Encounter (Signed)
Pt aware, script sent to pharmacy.

## 2019-04-26 ENCOUNTER — Other Ambulatory Visit: Payer: Self-pay | Admitting: Internal Medicine

## 2019-06-04 ENCOUNTER — Telehealth: Payer: Self-pay | Admitting: Cardiovascular Disease

## 2019-06-04 ENCOUNTER — Telehealth: Payer: Self-pay

## 2019-06-04 NOTE — Telephone Encounter (Signed)
Attempt to return call x 2-line remains busy.

## 2019-06-04 NOTE — Telephone Encounter (Signed)
New Message:   Pt wants to know if there is a blood test that she can take to see if she is immuned to [place] please. She have had COVID and wants to know about her being immuned.

## 2019-06-04 NOTE — Telephone Encounter (Signed)
Patient was seen Dr. Deborra Medina and she is leaving. Patient used to see Dr Darnell Level and wanted to see if he would take patient back on. (Also see note about her husband)

## 2019-06-05 NOTE — Telephone Encounter (Signed)
Ok to transfer back.

## 2019-06-05 NOTE — Telephone Encounter (Signed)
Pt calling in wanting to know if there is a lab that could be added on to the blood work she is getting that could test for Covid immunity. Notified pt that we do not test for antibodies here and that she could contact her PCP to see if this is something they offer.

## 2019-06-06 ENCOUNTER — Other Ambulatory Visit: Payer: Self-pay

## 2019-06-06 DIAGNOSIS — I10 Essential (primary) hypertension: Secondary | ICD-10-CM

## 2019-06-06 DIAGNOSIS — E785 Hyperlipidemia, unspecified: Secondary | ICD-10-CM | POA: Diagnosis not present

## 2019-06-06 DIAGNOSIS — Z79899 Other long term (current) drug therapy: Secondary | ICD-10-CM | POA: Diagnosis not present

## 2019-06-06 LAB — COMPREHENSIVE METABOLIC PANEL
ALT: 18 IU/L (ref 0–32)
AST: 22 IU/L (ref 0–40)
Albumin/Globulin Ratio: 2.1 (ref 1.2–2.2)
Albumin: 4.2 g/dL (ref 3.6–4.6)
Alkaline Phosphatase: 92 IU/L (ref 39–117)
BUN/Creatinine Ratio: 15 (ref 12–28)
BUN: 20 mg/dL (ref 8–27)
Bilirubin Total: 0.5 mg/dL (ref 0.0–1.2)
CO2: 25 mmol/L (ref 20–29)
Calcium: 9.8 mg/dL (ref 8.7–10.3)
Chloride: 103 mmol/L (ref 96–106)
Creatinine, Ser: 1.34 mg/dL — ABNORMAL HIGH (ref 0.57–1.00)
GFR calc Af Amer: 42 mL/min/{1.73_m2} — ABNORMAL LOW (ref >59)
GFR calc non Af Amer: 37 mL/min/{1.73_m2} — ABNORMAL LOW (ref >59)
Globulin, Total: 2 g/dL (ref 1.5–4.5)
Glucose: 91 mg/dL (ref 65–99)
Potassium: 5.7 mmol/L — ABNORMAL HIGH (ref 3.5–5.2)
Sodium: 142 mmol/L (ref 134–144)
Total Protein: 6.2 g/dL (ref 6.0–8.5)

## 2019-06-06 LAB — CBC
Hematocrit: 27.6 % — ABNORMAL LOW (ref 34.0–46.6)
Hemoglobin: 9.8 g/dL — ABNORMAL LOW (ref 11.1–15.9)
MCH: 34.9 pg — ABNORMAL HIGH (ref 26.6–33.0)
MCHC: 35.5 g/dL (ref 31.5–35.7)
MCV: 98 fL — ABNORMAL HIGH (ref 79–97)
Platelets: 360 10*3/uL (ref 150–450)
RBC: 2.81 x10E6/uL — ABNORMAL LOW (ref 3.77–5.28)
RDW: 11.9 % (ref 11.7–15.4)
WBC: 8.8 10*3/uL (ref 3.4–10.8)

## 2019-06-06 LAB — LIPID PANEL
Chol/HDL Ratio: 3.3 ratio (ref 0.0–4.4)
Cholesterol, Total: 187 mg/dL (ref 100–199)
HDL: 57 mg/dL (ref >39)
LDL Chol Calc (NIH): 110 mg/dL — ABNORMAL HIGH (ref 0–99)
Triglycerides: 109 mg/dL (ref 0–149)
VLDL Cholesterol Cal: 20 mg/dL (ref 5–40)

## 2019-06-06 LAB — TSH: TSH: 6.14 u[IU]/mL — ABNORMAL HIGH (ref 0.450–4.500)

## 2019-06-11 ENCOUNTER — Encounter: Payer: Self-pay | Admitting: Cardiovascular Disease

## 2019-06-11 ENCOUNTER — Other Ambulatory Visit: Payer: Self-pay | Admitting: *Deleted

## 2019-06-11 DIAGNOSIS — Z79899 Other long term (current) drug therapy: Secondary | ICD-10-CM

## 2019-06-11 NOTE — Telephone Encounter (Signed)
Pt wanted July appointment  Appointment 7/19

## 2019-06-23 ENCOUNTER — Other Ambulatory Visit: Payer: Self-pay | Admitting: Internal Medicine

## 2019-06-27 DIAGNOSIS — I10 Essential (primary) hypertension: Secondary | ICD-10-CM | POA: Diagnosis not present

## 2019-06-27 DIAGNOSIS — Z79899 Other long term (current) drug therapy: Secondary | ICD-10-CM | POA: Diagnosis not present

## 2019-06-27 DIAGNOSIS — E785 Hyperlipidemia, unspecified: Secondary | ICD-10-CM | POA: Diagnosis not present

## 2019-06-27 LAB — BASIC METABOLIC PANEL
BUN/Creatinine Ratio: 14 (ref 12–28)
BUN: 18 mg/dL (ref 8–27)
CO2: 24 mmol/L (ref 20–29)
Calcium: 9.9 mg/dL (ref 8.7–10.3)
Chloride: 103 mmol/L (ref 96–106)
Creatinine, Ser: 1.3 mg/dL — ABNORMAL HIGH (ref 0.57–1.00)
GFR calc Af Amer: 44 mL/min/{1.73_m2} — ABNORMAL LOW (ref >59)
GFR calc non Af Amer: 38 mL/min/{1.73_m2} — ABNORMAL LOW (ref >59)
Glucose: 98 mg/dL (ref 65–99)
Potassium: 5.3 mmol/L — ABNORMAL HIGH (ref 3.5–5.2)
Sodium: 138 mmol/L (ref 134–144)

## 2019-07-03 ENCOUNTER — Telehealth: Payer: Self-pay

## 2019-07-03 NOTE — Telephone Encounter (Signed)
I would recommend Covid vaccination

## 2019-07-03 NOTE — Telephone Encounter (Signed)
Spoke with patient on the phone regarding Dr. Evette Georges review of recent labs. Per Dr.Kelly "Creatinine slightly improved and minimally elevated at 1.3, stage III CKD; potassium improved but borderline elevated at 5.3; avoid potassium containing foods" Explained that these results have improved from her last lab check.  Pt asked about liver labs and the last time they were drawn. Notified pt that she had labs drawn on 06/06/19 and that her liver results were normal. Advised pt to make dietary changes concerning K+ foods.  However pt questioning as to what she should eat because these foods make up a large part of her diet (leafy greens). Told patient to eat in moderation but to do her best to avoid these types of foods.  Pt agreeable.  Pt would also like Dr. Evette Georges opinion on her receiving the Covid 19 vaccine. Pt states that she has heard many different things on the vaccination but was wondering what Dr.Kelly's thoughts were. Will route to MD for review.

## 2019-07-05 NOTE — Telephone Encounter (Signed)
Attempted to call pt. Unable to leave VM due to line busy tone.

## 2019-07-05 NOTE — Telephone Encounter (Signed)
LVM2CB 2/25

## 2019-07-08 NOTE — Telephone Encounter (Signed)
Spoke with patient on 2/26 regarding Dr.Kelly's recommendation of receiving the Covid Vaccine. Pt asking for more clarification, states that she had Covid 19 in November/December and didn't know if there was a specific period of time she needed to wait before getting the vaccine. Notified pt I would clarify with Dr.Kelly and get back with her. Pt thankful with no other questions or concerns at this time. Will route to MD

## 2019-07-10 NOTE — Telephone Encounter (Signed)
Spoke with pt, notified that per the CDC, they recommend that you wait 90 days before receiving the vaccine after having COVID.  Pt states that she has heard that if  you do get the shot that it only lasts 6 months. Told pt I was unaware of this. Pt states she thinks she is still having some "after shock" effects. She states that she has had 5 migraines since the virus and that she will wake up at times with bloodshot eyes. She also states that she had a spot on her earlobe that was hurting and had spread down her neck. But no other complaints. Pt asking where she can receive the shot. Notified that she can call [facility] and schedule and appt. Pt still seems weary about the vaccine, but states that she is thankful we did some research for her. Told pt if she had any other concerns to let us know. Pt verbalized understanding.

## 2019-07-27 ENCOUNTER — Other Ambulatory Visit: Payer: Self-pay | Admitting: Internal Medicine

## 2019-07-27 MED ORDER — MESALAMINE 1000 MG RE SUPP: 1000.0000 mg | Freq: Every day | RECTAL | 1 refills | Status: DC

## 2019-07-27 NOTE — Telephone Encounter (Signed)
Patient called states she had covid and is just now getting over it and she is requesting refill on Mesalamine.

## 2019-07-27 NOTE — Telephone Encounter (Signed)
Patient had COVID in November 2020. She is still not comfortable coming to doctors visits. 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 She was to originally follow up with him in 8 weeks. Patient has been scheduled for phone visit with Dr Hilarie Fredrickson on 09/19/19 at 930 am.

## 2019-07-27 NOTE — Telephone Encounter (Signed)
Pt called to inform that she does not want Canasa because it is too expensive. She told her pharmacy that she prefers Mesalamine but the pharmacy told her that the prescription that we sent was for [facility] and they could not give her the generic. Pt is asking to change it to Mesalamine, pls.

## 2019-07-27 NOTE — Telephone Encounter (Signed)
Pt called stating Pharmacy stated suppositories were order however pt wants Enema.  Please call in order for Enema.

## 2019-07-30 ENCOUNTER — Other Ambulatory Visit: Payer: Self-pay | Admitting: Internal Medicine

## 2019-07-30 NOTE — Telephone Encounter (Signed)
Pt stated that pharmacy did not receive mesalamine enema rx.  Please resend.  She requested enema only and not suppository.

## 2019-08-03 NOTE — Telephone Encounter (Signed)
Enema and suppository are both mesalamine.  She should not be using both.

## 2019-08-13 ENCOUNTER — Other Ambulatory Visit: Payer: Self-pay | Admitting: Internal Medicine

## 2019-08-14 NOTE — Telephone Encounter (Signed)
Pt called and requested prescription for mesalamine enema.

## 2019-08-15 NOTE — Telephone Encounter (Signed)
Medication was sent on 07/27/19. Patient also had not been taking for sometime before that. We need to find out, is she having ulcerative proctosigmoiditis exacerbation symptoms making her feel that she needs these enemas again? If so, we may need to get her seen sooner than 09/25/19.   I attempted to reach patient but got voicemail. I have left a message asking that she call back.

## 2019-08-16 ENCOUNTER — Other Ambulatory Visit: Payer: Self-pay

## 2019-08-16 MED ORDER — MESALAMINE 4 G RE ENEM: 4.0000 g | ENEMA | Freq: Every day | RECTAL | 1 refills | Status: DC

## 2019-08-16 NOTE — Telephone Encounter (Signed)
Called and spoke to patient.  She indicated she would like a script for Rowasa enemas to get her to her May 18th appt with Dr. Hilarie Fredrickson. She is very upset she has had to make phone calls back and forth between the pharmacy and our office to get the enemas that Dr. Hilarie Fredrickson prescribed for her and told her to take when she needs them.  She had Covid last year and has lingering effects that have been very difficult for her and she believes it got her system off wack.  She questioned why she is required to see Dr. Hilarie Fredrickson regularly when she see her cardiologist regularly and he can see all his notes in her chart. She asked my opinion and  I explained that each specialist has an obligation to regularly evaluate patients they are prescribing medications for to ensure they have not had a significant change in their medical history including other medications that may effect their treatment, etc.  She wants to keep her appt with Dr. Hilarie Fredrickson on 5-18 and refused an earlier appt with an APP. Spoke to Dr. Hilarie Fredrickson and he approved Rowasa enemas until her 5-18 appt. Script sent.

## 2019-08-16 NOTE — Telephone Encounter (Addendum)
Patient is calling and is very frustrated states she is still waiting for her medication (enema) and she does not know what is happening please advise if she does not answer please leave a detailed message per patient

## 2019-08-16 NOTE — Telephone Encounter (Signed)
See phone note from 08-16-19

## 2019-09-14 ENCOUNTER — Other Ambulatory Visit: Payer: Self-pay | Admitting: Internal Medicine

## 2019-09-19 ENCOUNTER — Ambulatory Visit: Payer: Medicare Other | Admitting: Internal Medicine

## 2019-09-25 ENCOUNTER — Encounter: Payer: Self-pay | Admitting: Internal Medicine

## 2019-09-25 ENCOUNTER — Ambulatory Visit (INDEPENDENT_AMBULATORY_CARE_PROVIDER_SITE_OTHER): Payer: Medicare Other | Admitting: Internal Medicine

## 2019-09-25 VITALS — Ht 61.0 in | Wt 140.0 lb

## 2019-09-25 DIAGNOSIS — K5909 Other constipation: Secondary | ICD-10-CM

## 2019-09-25 DIAGNOSIS — Z8616 Personal history of COVID-19: Secondary | ICD-10-CM | POA: Diagnosis not present

## 2019-09-25 DIAGNOSIS — K51311 Ulcerative (chronic) rectosigmoiditis with rectal bleeding: Secondary | ICD-10-CM

## 2019-09-25 DIAGNOSIS — R6889 Other general symptoms and signs: Secondary | ICD-10-CM | POA: Diagnosis not present

## 2019-09-25 NOTE — Patient Instructions (Addendum)
Please go to [facility] for labs between the hours of 8 am-12:45 pm OR 1:45 pm-4:30 pm M-F for your labwork. We have ordered a CBC,CMP, IBC, Ferritin, folate and B12 level.  Continue Rowasa 2 g at bedtime.  Continue sulfasalazine 1 g twice daily  Please resume folate 1 mg daily given chronic sulfasalazine therapy.  You have been scheduled to see Dr Hilarie Fredrickson in follow up on Monday, 12/24/19 at 2:30 pm.  Continue Benefiber.   You may discontinue Miralax for now. However, if you become more constipated when MiraLAX is stopped, you may resume it at 9-17 grams daily.  Please strongly consider COVID-19 vaccination.  You have been scheduled for a CT scan of the abdomen and pelvis at [facility] (1st Floor hospital)--per Dr Hilarie Fredrickson, you do NOT have to take IV NOR oral contrast for this test.  You are scheduled on Tuesday, 10/09/19 at 2:30 pm. You should arrive 15 minutes prior to your appointment time for registration. Please follow the written instructions below on the day of your exam:  1) Do not eat or drink anything after 10:30 am (4 hours prior to your test)  You may take any medications as prescribed with a small amount of water, if necessary. If you take any of the following medications: METFORMIN, GLUCOPHAGE, [place], AVANDAMET, RIOMET, FORTAMET, ACTOPLUS MET, JANUMET, GLUMETZA or METAGLIP, you MAY be asked to HOLD this medication 48 hours AFTER the exam.  Plan on being at [facility] for 30 minutes or longer, depending on the type of exam you are having performed.  This test typically takes 30-45 minutes to complete.  If you have any questions regarding your exam or if you need to reschedule, you may call the CT department at [phone] between the hours of 8:00 am and 5:00 pm, Monday-Friday.  __________________________________________________________

## 2019-09-25 NOTE — Progress Notes (Signed)
Subjective:   This service was provided via telemedicine.  Telephone encounter The patient was located at home The provider was located in provider's GI office. The patient did consent to this telephone visit and is aware of possible charges through their insurance for this visit.   The persons participating in this telemedicine service were the patient and I. Time spent on call: 22 minutes    Patient ID: Diana Brewer, female    DOB: 1934/04/30, 86 y.o.   MRN: [id number]  HPI Silvano Rusk is an 86 year old female with a history of distal proctosigmoiditis, mild constipation, hyperlipidemia, mitral valve prolapse, hypertension and COVID-19 in November 2020 who is seen for follow-up.  She is seen by telephone visit at her request today.  She was last seen by telehealth visit/virtual visit in April 2020.  She reports that she became very sick with COVID-19 in November and December.  She remained out of the hospital but had a fever and cough.  She had nausea, change in bowel habits and just general fatigue.  Her husband as well as her daughter, son-in-law and granddaughter all had COVID-19 around the same time.  During this period she felt too ill to use her Rowasa enemas and she had recurrent blood in stool which has been the hallmark of her proctosigmoiditis.  She called the office and I recommended Zofran which she said was very helpful and we also advised that she use senna around the time that she felt very constipated.  She has slowly recovered and she has resumed her Rowasa enemas at 2 g at bedtime.  She is taking sulfasalazine 1 g twice daily but has been off of folic acid for unclear reason.  She has been using Benefiber daily and also about a half dose of MiraLAX daily.  Her bowel movements have become regular once per day.  Her bowel movements have not been back to normal size and been slightly smaller than normal but no further blood in stool, blood with wiping.  No melena.  She is  having some left-sided abdominal discomfort.  Her appetite is good and she denies nausea and vomiting recently.  No early satiety.  Weight has been stable.  She has not had COVID-19 vaccination nor has she really left the house very much due to fear of recurrent infection.     Review of Systems As per HPI, otherwise negative  Current Medications, Allergies, Past Medical History, Past Surgical History, Family History and Social History were reviewed in [employer] record.     Objective:   Physical Exam Ht 5' 1"  (1.549 m)   Wt 140 lb (63.5 kg)   BMI 26.45 kg/m  No physical exam/telephone visit  CBC    Component Value Date/Time   WBC 8.8 06/06/2019 0927   WBC 7.0 07/03/2018 0909   RBC 2.81 (L) 06/06/2019 0927   RBC 3.37 (L) 07/03/2018 0909   HGB 9.8 (L) 06/06/2019 0927   HCT 27.6 (L) 06/06/2019 0927   PLT 360 06/06/2019 0927   MCV 98 (H) 06/06/2019 0927   MCH 34.9 (H) 06/06/2019 0927   MCH 32.3 07/03/2018 0909   MCHC 35.5 06/06/2019 0927   MCHC 31.8 07/03/2018 0909   RDW 11.9 06/06/2019 0927   LYMPHSABS 1.6 07/03/2018 0909   LYMPHSABS 1.8 08/09/2017 0826   MONOABS 0.7 07/03/2018 0909   EOSABS 0.3 07/03/2018 0909   EOSABS 0.3 08/09/2017 0826   BASOSABS 0.1 07/03/2018 0909   BASOSABS 0.1 08/09/2017 0826  CMP     Component Value Date/Time   NA 138 06/27/2019 0954   K 5.3 (H) 06/27/2019 0954   CL 103 06/27/2019 0954   CO2 24 06/27/2019 0954   GLUCOSE 98 06/27/2019 0954   GLUCOSE 85 03/10/2016 0814   BUN 18 06/27/2019 0954   CREATININE 1.30 (H) 06/27/2019 0954   CREATININE 1.24 (H) 03/10/2016 0814   CALCIUM 9.9 06/27/2019 0954   PROT 6.2 06/06/2019 0927   ALBUMIN 4.2 06/06/2019 0927   AST 22 06/06/2019 0927   ALT 18 06/06/2019 0927   ALKPHOS 92 06/06/2019 0927   BILITOT 0.5 06/06/2019 0927   GFRNONAA 38 (L) 06/27/2019 0954   GFRAA 44 (L) 06/27/2019 0954        Assessment & Plan:  86 year old female with a history of distal  proctosigmoiditis, mild constipation, hyperlipidemia, mitral valve prolapse, hypertension and COVID-19 in November 2020 who is seen for follow-up.   1.  Ulcerative proctosigmoiditis --she had a flare of proctosigmoiditis symptoms predominantly blood in stool when she was off of her mesalamine enemas around the time of COVID-19.  She also had nausea as well as constipation exacerbation by COVID-19.  She has continued sulfasalazine and been able to resume Rowasa as well as MiraLAX and Benefiber.  She is still having left-sided abdominal pain.  Over time she has wanted to avoid surveillance colonoscopies due to age.  However with her left-sided abdominal pain I have recommended that we consider imaging.  We will continue current therapy as follows: --CT scan of the abdomen pelvis without IV contrast --I recommended that we repeat labs with CBC, CMP, B12/folate, ferritin plus IBC --she would like to have these done at Dr. Evette Georges office because she is more comfortable with the lab there.  We will try to order at heart care if possible.  If not we should request that she have the labs done at our lab --Continue Rowasa 2 g at bedtime --Continue sulfasalazine 1 g twice daily --I instructed her to resume folate 1 mg daily given chronic sulfasalazine therapy --42-monthfollow-up  2.  Constipation --well treated now;  she can continue Benefiber but I will have her back off MiraLAX.  It is most likely that her stools are somewhat smaller due to MiraLAX therapy.  However if she becomes more constipated when MiraLAX is stopped she she can resume it at half dose to full dose daily.  3.  History of COVID-19 --she has recovered fortunately from COVID-19 and did not require hospitalization.  I encouraged her to strongly consider vaccination.

## 2019-09-25 NOTE — Addendum Note (Signed)
Addended by: Larina Bras on: 09/25/2019 05:15 PM   Modules accepted: Orders

## 2019-10-01 ENCOUNTER — Other Ambulatory Visit: Payer: Self-pay | Admitting: *Deleted

## 2019-10-01 DIAGNOSIS — R6889 Other general symptoms and signs: Secondary | ICD-10-CM

## 2019-10-01 DIAGNOSIS — K5909 Other constipation: Secondary | ICD-10-CM

## 2019-10-01 DIAGNOSIS — K51311 Ulcerative (chronic) rectosigmoiditis with rectal bleeding: Secondary | ICD-10-CM

## 2019-10-01 DIAGNOSIS — Z8616 Personal history of COVID-19: Secondary | ICD-10-CM

## 2019-10-02 LAB — IRON AND TIBC
Iron Saturation: 31 % (ref 15–55)
Iron: 85 ug/dL (ref 27–139)
Total Iron Binding Capacity: 274 ug/dL (ref 250–450)
UIBC: 189 ug/dL (ref 118–369)

## 2019-10-02 LAB — COMPREHENSIVE METABOLIC PANEL
ALT: 16 IU/L (ref 0–32)
AST: 18 IU/L (ref 0–40)
Albumin/Globulin Ratio: 2.4 — ABNORMAL HIGH (ref 1.2–2.2)
Albumin: 4.4 g/dL (ref 3.6–4.6)
Alkaline Phosphatase: 73 IU/L (ref 48–121)
BUN/Creatinine Ratio: 14 (ref 12–28)
BUN: 19 mg/dL (ref 8–27)
Bilirubin Total: 0.3 mg/dL (ref 0.0–1.2)
CO2: 24 mmol/L (ref 20–29)
Calcium: 9.6 mg/dL (ref 8.7–10.3)
Chloride: 105 mmol/L (ref 96–106)
Creatinine, Ser: 1.39 mg/dL — ABNORMAL HIGH (ref 0.57–1.00)
GFR calc Af Amer: 40 mL/min/{1.73_m2} — ABNORMAL LOW (ref >59)
GFR calc non Af Amer: 35 mL/min/{1.73_m2} — ABNORMAL LOW (ref >59)
Globulin, Total: 1.8 g/dL (ref 1.5–4.5)
Glucose: 92 mg/dL (ref 65–99)
Potassium: 4.7 mmol/L (ref 3.5–5.2)
Sodium: 143 mmol/L (ref 134–144)
Total Protein: 6.2 g/dL (ref 6.0–8.5)

## 2019-10-02 LAB — CBC WITH DIFFERENTIAL/PLATELET
Basophils Absolute: 0.1 10*3/uL (ref 0.0–0.2)
Basos: 1 %
EOS (ABSOLUTE): 0.3 10*3/uL (ref 0.0–0.4)
Eos: 4 %
Hematocrit: 32 % — ABNORMAL LOW (ref 34.0–46.6)
Hemoglobin: 10.7 g/dL — ABNORMAL LOW (ref 11.1–15.9)
Immature Grans (Abs): 0 10*3/uL (ref 0.0–0.1)
Immature Granulocytes: 0 %
Lymphocytes Absolute: 1.5 10*3/uL (ref 0.7–3.1)
Lymphs: 21 %
MCH: 34.7 pg — ABNORMAL HIGH (ref 26.6–33.0)
MCHC: 33.4 g/dL (ref 31.5–35.7)
MCV: 104 fL — ABNORMAL HIGH (ref 79–97)
Monocytes Absolute: 0.8 10*3/uL (ref 0.1–0.9)
Monocytes: 12 %
Neutrophils Absolute: 4.4 10*3/uL (ref 1.4–7.0)
Neutrophils: 62 %
Platelets: 365 10*3/uL (ref 150–450)
RBC: 3.08 x10E6/uL — ABNORMAL LOW (ref 3.77–5.28)
RDW: 11.8 % (ref 11.7–15.4)
WBC: 7.1 10*3/uL (ref 3.4–10.8)

## 2019-10-02 LAB — FERRITIN: Ferritin: 75 ng/mL (ref 15–150)

## 2019-10-02 LAB — FOLATE: Folate: >20 ng/mL (ref >3.0)

## 2019-10-02 LAB — VITAMIN B12: Vitamin B-12: 884 pg/mL (ref 232–1245)

## 2019-10-03 ENCOUNTER — Telehealth: Payer: Self-pay

## 2019-10-03 NOTE — Telephone Encounter (Signed)
-----   Message from Jerene Bears, MD sent at 10/03/2019 11:54 AM EDT ----- Herbert Seta Thank you. Please copy this messaging string to the patient's chart.   Staff messages are not part of the permanent record and it is best to have the below documented. It is fine to communicate with me via staff message, but if needed I will ask you to add to permanent record.  Vaughan Basta can show you how if you need.  Thanks JMP ----- Message ----- From: Yevette Edwards, RN Sent: 10/03/2019  11:27 AM EDT To: Jerene Bears, MD  Dr. Hilarie Fredrickson,  Patient requested that information be left on her voicemail if she did not answer. Left detailed message for patient, advised that we will cancel current CT appointment and to call if pain returns. Patient advised to call office with any other questions or concerns. Thank you. ----- Message ----- From: Jerene Bears, MD Sent: 10/03/2019  11:19 AM EDT To: Yevette Edwards, RN  Ok, however, please advise that she notify me if this pain returns. Thanks JMP ----- Message ----- From: Yevette Edwards, RN Sent: 10/03/2019  10:35 AM EDT To: Jerene Bears, MD  Dr. Hilarie Fredrickson,   I spoke with patient regarding her lab results. Patient states that since stopping the Miralax she has been feeling better and not having left sided abdominal pain, patient would like to push back CT scan if possible. Please advise.   Thank you!

## 2019-10-03 NOTE — Telephone Encounter (Signed)
Noted, thank you

## 2019-10-04 ENCOUNTER — Telehealth: Payer: Self-pay | Admitting: Cardiovascular Disease

## 2019-10-04 DIAGNOSIS — R7989 Other specified abnormal findings of blood chemistry: Secondary | ICD-10-CM

## 2019-10-04 DIAGNOSIS — E785 Hyperlipidemia, unspecified: Secondary | ICD-10-CM

## 2019-10-04 DIAGNOSIS — E7800 Pure hypercholesterolemia, unspecified: Secondary | ICD-10-CM

## 2019-10-04 DIAGNOSIS — Z79899 Other long term (current) drug therapy: Secondary | ICD-10-CM

## 2019-10-04 NOTE — Telephone Encounter (Signed)
New Message  Patient is calling in to have a nurse look over her lab results, especially the potassium and give patient the results of it. Patient would also like to know if Dr. Claiborne Billings will want las done when patient comes in for her appointment  02/01/20 with Dr. Claiborne Billings. Please give a call to assist.

## 2019-10-05 NOTE — Telephone Encounter (Signed)
Potassium was 4.7 which is fine.  Okay for follow-up laboratory prior to her September 2021 office visit

## 2019-10-09 ENCOUNTER — Ambulatory Visit (HOSPITAL_COMMUNITY): Payer: Medicare Other

## 2019-10-09 NOTE — Telephone Encounter (Signed)
Called and spoke with pt, notified that Dr.Kelly reviewed her potassium and stated it was fine at 4.7. He stated she could have follow up labs prior to her office visit in September.   Pt asked what her previous potassium level was, notified it was 5.3. pt stated she has a chronic colon problem and had previously had some rectal bleeding which she thought may be the cause of her increased potassium.  Pt verbalized understanding and had no other questions at this time. Notified that lab slips would be mailed to her and she could have her labs drawn prior to her office visit. Pt thankful for the call with no other questions.

## 2019-11-06 ENCOUNTER — Other Ambulatory Visit: Payer: Self-pay | Admitting: *Deleted

## 2019-11-06 MED ORDER — SULFASALAZINE 500 MG PO TABS: ORAL_TABLET | ORAL | 0 refills | Status: DC

## 2019-11-13 ENCOUNTER — Other Ambulatory Visit: Payer: Self-pay | Admitting: Internal Medicine

## 2019-11-23 ENCOUNTER — Other Ambulatory Visit: Payer: Self-pay

## 2019-11-26 ENCOUNTER — Encounter: Payer: Self-pay | Admitting: Family Medicine

## 2019-11-26 ENCOUNTER — Ambulatory Visit (INDEPENDENT_AMBULATORY_CARE_PROVIDER_SITE_OTHER): Payer: Medicare Other | Admitting: Family Medicine

## 2019-11-26 ENCOUNTER — Other Ambulatory Visit: Payer: Self-pay

## 2019-11-26 VITALS — BP 140/84 | HR 77 | Temp 97.8°F | Ht 59.75 in | Wt 144.2 lb

## 2019-11-26 DIAGNOSIS — I119 Hypertensive heart disease without heart failure: Secondary | ICD-10-CM

## 2019-11-26 DIAGNOSIS — N1832 Chronic kidney disease, stage 3b: Secondary | ICD-10-CM | POA: Diagnosis not present

## 2019-11-26 DIAGNOSIS — Z8616 Personal history of COVID-19: Secondary | ICD-10-CM

## 2019-11-26 DIAGNOSIS — N183 Chronic kidney disease, stage 3 unspecified: Secondary | ICD-10-CM | POA: Insufficient documentation

## 2019-11-26 DIAGNOSIS — R002 Palpitations: Secondary | ICD-10-CM | POA: Diagnosis not present

## 2019-11-26 DIAGNOSIS — K5130 Ulcerative (chronic) rectosigmoiditis without complications: Secondary | ICD-10-CM | POA: Diagnosis not present

## 2019-11-26 DIAGNOSIS — E7800 Pure hypercholesterolemia, unspecified: Secondary | ICD-10-CM

## 2019-11-26 DIAGNOSIS — D539 Nutritional anemia, unspecified: Secondary | ICD-10-CM | POA: Insufficient documentation

## 2019-11-26 DIAGNOSIS — M19039 Primary osteoarthritis, unspecified wrist: Secondary | ICD-10-CM | POA: Diagnosis not present

## 2019-11-26 MED ORDER — FOLIC ACID 1 MG PO TABS: 1.0000 mg | ORAL_TABLET | Freq: Every day | ORAL | Status: AC

## 2019-11-26 NOTE — Patient Instructions (Signed)
Good to see you today Ensure good hydration status at home especially during hot summer months.  Limit salt/sodium in the diet for kidney health as well.  Return as needed or in 1 year for wellness visit.

## 2019-11-26 NOTE — Assessment & Plan Note (Signed)
She manages osteoarthritis by limiting hand use.

## 2019-11-26 NOTE — Assessment & Plan Note (Addendum)
Present over the years. Will continue to monitor. Encouraged good hydration status, limiting NSAIDs and other nephrotoxic medications.

## 2019-11-26 NOTE — Assessment & Plan Note (Signed)
Appreciate GI care. Back on mesalamine enemas with sulfasalazine daily.

## 2019-11-26 NOTE — Assessment & Plan Note (Addendum)
Recent folate and b12 levels normal. If persists, check periph smear.

## 2019-11-26 NOTE — Progress Notes (Signed)
This visit was conducted in person.  BP 140/84 (BP Location: Right Arm, Patient Position: Sitting, Cuff Size: Normal)   Pulse 77   Temp 97.8 F (36.6 C) (Temporal)   Ht 4' 11.75" (1.518 m)   Wt 144 lb 3 oz (65.4 kg)   SpO2 96%   BMI 28.40 kg/m    CC: re establish Subjective:    Patient ID: Diana Brewer, female    DOB: 12-23-1933, 86 y.o.   MRN: [id number]  HPI: Diana Brewer is a 86 y.o. female presenting on 11/26/2019 for Transitions Of Care (From Dr. Deborra Medina. )   Last seen in our office or [facility] 04/2016.   Suffered COVID19 infection 03/2019 - was very sick from this but fortunately stayed out of the hospital. Entire family had this. Ongoing post-COVID fatigue. She continues multivitamin as well as her zetia for HLD, and mesalamine enemas and sulfasalazine for inflammatory proctitis followed by GI ([name]). Continues benefiber > miralax.   Feels covid infection may have triggered recommencement of migraines.   HTN, HLD managed by cardiology, last seen 01/2019. Palpitations managed with propranolol 2m bid. Home BP readings systolic 1353G   Not interested in COVID vaccine at this time. Discussed COVID-19 and related issues.      Relevant past medical, surgical, family and social history reviewed and updated as indicated. Interim medical history since our last visit reviewed. Allergies and medications reviewed and updated. Outpatient Medications Prior to Visit  Medication Sig Dispense Refill  . ezetimibe (ZETIA) 10 MG tablet Take 1 tablet (10 mg total) by mouth daily. (Patient taking differently: Take 10 mg by mouth every other day. ) 90 tablet 3  . mesalamine (ROWASA) 4 g enema PLACE 60 MLS (4 G TOTAL) RECTALLY AT BEDTIME. 1800 mL 1  . Multiple Vitamins-Minerals (CENTRUM SILVER) tablet Take 1 tablet by mouth daily.     . polyethylene glycol powder (GLYCOLAX/MIRALAX) powder Take 1/2 capful ( 9gm) dissolved in at least 8 oz. Of water or juice once daily 527 g 3  .  propranolol (INDERAL) 40 MG tablet TAKE 1 TABLET BY MOUTH TWICE A DAY 180 tablet 3  . sulfaSALAzine (AZULFIDINE) 500 MG tablet TAKE 2 TABLETS BY MOUTH TWICE A DAY. MUST HAVE OFFICE VISIT FOR FURTHER REFILLS 360 tablet 0  . Wheat Dextrin (BENEFIBER PO) 1 tablespoon daily     No facility-administered medications prior to visit.     Per HPI unless specifically indicated in ROS section below Review of Systems Objective:  BP 140/84 (BP Location: Right Arm, Patient Position: Sitting, Cuff Size: Normal)   Pulse 77   Temp 97.8 F (36.6 C) (Temporal)   Ht 4' 11.75" (1.518 m)   Wt 144 lb 3 oz (65.4 kg)   SpO2 96%   BMI 28.40 kg/m   Wt Readings from Last 3 Encounters:  11/26/19 144 lb 3 oz (65.4 kg)  09/25/19 140 lb (63.5 kg)  01/29/19 147 lb (66.7 kg)      Physical Exam Vitals and nursing note reviewed.  Constitutional:      Appearance: Normal appearance. She is not ill-appearing.  Eyes:     Extraocular Movements: Extraocular movements intact.     Conjunctiva/sclera: Conjunctivae normal.     Pupils: Pupils are equal, round, and reactive to light.  Neck:     Thyroid: No thyroid mass, thyromegaly or thyroid tenderness.  Cardiovascular:     Rate and Rhythm: Normal rate and regular rhythm.     Pulses: Normal pulses.  Heart sounds: Normal heart sounds. No murmur heard.   Pulmonary:     Effort: Pulmonary effort is normal. No respiratory distress.     Breath sounds: Normal breath sounds. No wheezing, rhonchi or rales.  Musculoskeletal:     Right lower leg: No edema.     Left lower leg: No edema.  Skin:    General: Skin is warm and dry.     Findings: No rash.  Neurological:     Mental Status: She is alert.  Psychiatric:        Mood and Affect: Mood normal.        Behavior: Behavior normal.       Results for orders placed or performed in visit on 10/01/19  CBC with Differential/Platelet  Result Value Ref Range   WBC 7.1 3.4 - 10.8 x10E3/uL   RBC 3.08 (L) 3.77 - 5.28  x10E6/uL   Hemoglobin 10.7 (L) 11.1 - 15.9 g/dL   Hematocrit 32.0 (L) 34.0 - 46.6 %   MCV 104 (H) 79 - 97 fL   MCH 34.7 (H) 26.6 - 33.0 pg   MCHC 33.4 31 - 35 g/dL   RDW 11.8 11.7 - 15.4 %   Platelets 365 150 - 450 x10E3/uL   Neutrophils 62 Not Estab. %   Lymphs 21 Not Estab. %   Monocytes 12 Not Estab. %   Eos 4 Not Estab. %   Basos 1 Not Estab. %   Neutrophils Absolute 4.4 1 - 7 x10E3/uL   Lymphocytes Absolute 1.5 0 - 3 x10E3/uL   Monocytes Absolute 0.8 0 - 0 x10E3/uL   EOS (ABSOLUTE) 0.3 0.0 - 0.4 x10E3/uL   Basophils Absolute 0.1 0 - 0 x10E3/uL   Immature Granulocytes 0 Not Estab. %   Immature Grans (Abs) 0.0 0.0 - 0.1 x10E3/uL  Comprehensive metabolic panel  Result Value Ref Range   Glucose 92 65 - 99 mg/dL   BUN 19 8 - 27 mg/dL   Creatinine, Ser 1.39 (H) 0.57 - 1.00 mg/dL   GFR calc non Af Amer 35 (L) >59 mL/min/1.73   GFR calc Af Amer 40 (L) >59 mL/min/1.73   BUN/Creatinine Ratio 14 12 - 28   Sodium 143 134 - 144 mmol/L   Potassium 4.7 3.5 - 5.2 mmol/L   Chloride 105 96 - 106 mmol/L   CO2 24 20 - 29 mmol/L   Calcium 9.6 8.7 - 10.3 mg/dL   Total Protein 6.2 6.0 - 8.5 g/dL   Albumin 4.4 3.6 - 4.6 g/dL   Globulin, Total 1.8 1.5 - 4.5 g/dL   Albumin/Globulin Ratio 2.4 (H) 1.2 - 2.2   Bilirubin Total 0.3 0.0 - 1.2 mg/dL   Alkaline Phosphatase 73 48 - 121 IU/L   AST 18 0 - 40 IU/L   ALT 16 0 - 32 IU/L  Iron and TIBC  Result Value Ref Range   Total Iron Binding Capacity 274 250 - 450 ug/dL   UIBC 189 118 - 369 ug/dL   Iron 85 27 - 139 ug/dL   Iron Saturation 31 15 - 55 %  Folate  Result Value Ref Range   Folate >20.0 >3.0 ng/mL  Vitamin B12  Result Value Ref Range   Vitamin B-12 884 232 - 1,245 pg/mL  Ferritin  Result Value Ref Range   Ferritin 75 15.0 - 150.0 ng/mL   Assessment & Plan:  This visit occurred during the SARS-CoV-2 public health emergency.  Safety protocols were in place, including screening questions prior to  the visit, additional usage of  staff PPE, and extensive cleaning of exam room while observing appropriate contact time as indicated for disinfecting solutions.   Problem List Items Addressed This Visit    Wrist arthritis    She manages osteoarthritis by limiting hand use.       Ulcerative proctosigmoiditis ([place]) - Primary    Appreciate GI care. Back on mesalamine enemas with sulfasalazine daily.       Rapid palpitations    Stable period on propranolol, followed by cardiology.       Macrocytic anemia    Recent folate and b12 levels normal. If persists, check periph smear.       Relevant Medications   folic acid (FOLVITE) 1 MG tablet   Hypercholesterolemia    Chronic, stable period on ezetimibe - this is managed by cardiology. The ASCVD Risk score Mikey Bussing DC Jr., et al., 2013) failed to calculate for the following reasons:   The 2013 ASCVD risk score is only valid for ages 52 to 71       History of COVID-19    Discussed COVID illness as well as pros/cons of vaccination in setting of prior infection.       CKD (chronic kidney disease) stage 3, GFR 30-59 ml/min    Present over the years. Will continue to monitor. Encouraged good hydration status, limiting NSAIDs and other nephrotoxic medications.       Benign hypertensive heart disease without heart failure    Chronic, stable period on propranolol.  BP today higher than normal (491-791 systolic) She will monitor pressures at home and let us know if they start trending up.           Meds ordered this encounter  Medications  . folic acid (FOLVITE) 1 MG tablet    Sig: Take 1 tablet (1 mg total) by mouth daily.   No orders of the defined types were placed in this encounter.   Patient Instructions  Good to see you today Ensure good hydration status at home especially during hot summer months.  Limit salt/sodium in the diet for kidney health as well.  Return as needed or in 1 year for wellness visit.    Follow up plan: Return in about 1 year (around  11/25/2020) for medicare wellness visit.  [name], MD

## 2019-11-26 NOTE — Assessment & Plan Note (Signed)
Discussed COVID illness as well as pros/cons of vaccination in setting of prior infection.

## 2019-11-26 NOTE — Assessment & Plan Note (Signed)
Stable period on propranolol, followed by cardiology.

## 2019-11-26 NOTE — Assessment & Plan Note (Addendum)
Chronic, stable period on ezetimibe - this is managed by cardiology. The ASCVD Risk score Mikey Bussing DC Jr., et al., 2013) failed to calculate for the following reasons:   The 2013 ASCVD risk score is only valid for ages 76 to 73

## 2019-11-26 NOTE — Assessment & Plan Note (Addendum)
Chronic, stable period on propranolol.  BP today higher than normal (169-678 systolic) She will monitor pressures at home and let us know if they start trending up.

## 2019-12-10 ENCOUNTER — Telehealth: Payer: Self-pay

## 2019-12-10 ENCOUNTER — Encounter: Payer: Self-pay | Admitting: Family Medicine

## 2019-12-10 ENCOUNTER — Ambulatory Visit (INDEPENDENT_AMBULATORY_CARE_PROVIDER_SITE_OTHER): Payer: Medicare Other | Admitting: Family Medicine

## 2019-12-10 ENCOUNTER — Ambulatory Visit (INDEPENDENT_AMBULATORY_CARE_PROVIDER_SITE_OTHER)
Admission: RE | Admit: 2019-12-10 | Discharge: 2019-12-10 | Disposition: A | Discharge status: Home / Self Care | Payer: Medicare Other | Source: Ambulatory Visit | Attending: Family Medicine | Admitting: Family Medicine

## 2019-12-10 ENCOUNTER — Other Ambulatory Visit: Payer: Self-pay

## 2019-12-10 VITALS — BP 134/78 | HR 84 | Temp 97.6°F | Ht 59.75 in | Wt 143.3 lb

## 2019-12-10 DIAGNOSIS — I700 Atherosclerosis of aorta: Secondary | ICD-10-CM | POA: Diagnosis not present

## 2019-12-10 DIAGNOSIS — M47814 Spondylosis without myelopathy or radiculopathy, thoracic region: Secondary | ICD-10-CM | POA: Diagnosis not present

## 2019-12-10 DIAGNOSIS — M546 Pain in thoracic spine: Secondary | ICD-10-CM

## 2019-12-10 DIAGNOSIS — S22000A Wedge compression fracture of unspecified thoracic vertebra, initial encounter for closed fracture: Secondary | ICD-10-CM | POA: Insufficient documentation

## 2019-12-10 DIAGNOSIS — S22060A Wedge compression fracture of T7-T8 vertebra, initial encounter for closed fracture: Secondary | ICD-10-CM | POA: Diagnosis not present

## 2019-12-10 DIAGNOSIS — M419 Scoliosis, unspecified: Secondary | ICD-10-CM | POA: Diagnosis not present

## 2019-12-10 MED ORDER — CALCIUM-VITAMIN D 600-400 MG-UNIT PO TABS: 1.0000 | ORAL_TABLET | Freq: Every day | ORAL | Status: DC

## 2019-12-10 MED ORDER — ACETAMINOPHEN 500 MG PO TABS: 500.0000 mg | ORAL_TABLET | Freq: Three times a day (TID) | ORAL | Status: DC

## 2019-12-10 NOTE — Assessment & Plan Note (Signed)
Xray concerning for vertebral compression fracture ~T7. Reviewed with patient. Recommend scheduled tylenol for pain relief, start calcium/vit D 600/400 daily. Update with pain control, would consider calcitonin vs stronger pain medication. Will order DEXA as well. Update if not improving to consider thoracolumbar brace vs spine clinic evaluation.

## 2019-12-10 NOTE — Telephone Encounter (Signed)
Pt called asking to have an xray for back pain. I advised her she would need a OV and then the xray can be ordered. Pt would like to be seen as soon as possible. Please advise where she could be in today or tomorrow. Call [phone]

## 2019-12-10 NOTE — Telephone Encounter (Signed)
Attempted to contact pt.  No answer.  No vm.  Need to schedule OV today, per Dr. Darnell Level, at 12:30 or 12:45 for back pain.

## 2019-12-10 NOTE — Telephone Encounter (Signed)
Spoke to pt. She is coming at 73

## 2019-12-10 NOTE — Telephone Encounter (Signed)
Please have her come in at 12:30-12:45pm today.

## 2019-12-10 NOTE — Progress Notes (Signed)
This visit was conducted in person.  BP 134/78 (BP Location: Left Arm, Patient Position: Sitting, Cuff Size: Normal)   Pulse 84   Temp 97.6 F (36.4 C) (Temporal)   Ht 4' 11.75" (1.518 m)   Wt 143 lb 5 oz (65 kg)   SpO2 96%   BMI 28.22 kg/m    CC: back pain  Subjective:    Patient ID: Diana Brewer, female    DOB: Mar 25, 1934, 86 y.o.   MRN: [id number]  HPI: Diana Brewer is a 86 y.o. female presenting on 12/10/2019 for Back Pain (C/o allover back pain.  Injured back when trying to put a pot on the stove.  Happened a few weeks ago.  Pain started  with a pop in posterior right sholder. )   ~3 weeks of diffuse back pain that started after injury while making pickles in crock pot - went to lift pot full of cucumbers/sugar/vinegar - "felt a pop" to right shoulder. No fall. Since then, pain can come on different parts of the back. No shooting pain down arms, numbness or weakness of arms.   Hasn't tried anything for this. Using "apin" oil and aspercream topically  No known h/o OP     Relevant past medical, surgical, family and social history reviewed and updated as indicated. Interim medical history since our last visit reviewed. Allergies and medications reviewed and updated. Outpatient Medications Prior to Visit  Medication Sig Dispense Refill  . ezetimibe (ZETIA) 10 MG tablet Take 1 tablet (10 mg total) by mouth daily. (Patient taking differently: Take 10 mg by mouth every other day. ) 90 tablet 3  . folic acid (FOLVITE) 1 MG tablet Take 1 tablet (1 mg total) by mouth daily.    . mesalamine (ROWASA) 4 g enema PLACE 60 MLS (4 G TOTAL) RECTALLY AT BEDTIME. 1800 mL 1  . Multiple Vitamins-Minerals (CENTRUM SILVER) tablet Take 1 tablet by mouth daily.     . polyethylene glycol powder (GLYCOLAX/MIRALAX) powder Take 1/2 capful ( 9gm) dissolved in at least 8 oz. Of water or juice once daily 527 g 3  . propranolol (INDERAL) 40 MG tablet TAKE 1 TABLET BY MOUTH TWICE A DAY 180 tablet 3    . sulfaSALAzine (AZULFIDINE) 500 MG tablet TAKE 2 TABLETS BY MOUTH TWICE A DAY. MUST HAVE OFFICE VISIT FOR FURTHER REFILLS 360 tablet 0  . Wheat Dextrin (BENEFIBER PO) 1 tablespoon daily     No facility-administered medications prior to visit.     Per HPI unless specifically indicated in ROS section below Review of Systems Objective:  BP 134/78 (BP Location: Left Arm, Patient Position: Sitting, Cuff Size: Normal)   Pulse 84   Temp 97.6 F (36.4 C) (Temporal)   Ht 4' 11.75" (1.518 m)   Wt 143 lb 5 oz (65 kg)   SpO2 96%   BMI 28.22 kg/m   Wt Readings from Last 3 Encounters:  12/10/19 143 lb 5 oz (65 kg)  11/26/19 144 lb 3 oz (65.4 kg)  09/25/19 140 lb (63.5 kg)    Ht Readings from Last 3 Encounters:  12/10/19 4' 11.75" (1.518 m)  11/26/19 4' 11.75" (1.518 m)  09/25/19 5' 1"  (1.549 m)      Physical Exam Vitals and nursing note reviewed.  Constitutional:      Appearance: Normal appearance. She is not ill-appearing.  Musculoskeletal:        General: Tenderness present. No deformity or signs of injury. Normal range of motion.  Cervical back: Normal range of motion and neck supple. No rigidity.     Comments:  No midline cervical spine tenderness  Diffuse discomfort to palpation down midline and paraspinous mm thoracic spine FROM at bilateral shoulders No pain with strength testing shoulders in int/ext rotation Discomfort with empty can sign and speed test  Lymphadenopathy:     Cervical: No cervical adenopathy.  Skin:    General: Skin is warm and dry.     Findings: No rash.  Neurological:     General: No focal deficit present.     Mental Status: She is alert.     Comments:  Neg spurling Grip strength intact 5/5 strength BUE       Assessment & Plan:  This visit occurred during the SARS-CoV-2 public health emergency.  Safety protocols were in place, including screening questions prior to the visit, additional usage of staff PPE, and extensive cleaning of exam room  while observing appropriate contact time as indicated for disinfecting solutions.   Problem List Items Addressed This Visit    Compression fracture of T7 vertebra ([place])   Relevant Orders   DG Bone Density   Acute midline thoracic back pain - Primary    Xray concerning for vertebral compression fracture ~T7. Reviewed with patient. Recommend scheduled tylenol for pain relief, start calcium/vit D 600/400 daily. Update with pain control, would consider calcitonin vs stronger pain medication. Will order DEXA as well. Update if not improving to consider thoracolumbar brace vs spine clinic evaluation.       Relevant Medications   acetaminophen (TYLENOL) 500 MG tablet   Other Relevant Orders   DG Thoracic Spine W/Swimmers       Meds ordered this encounter  Medications  . Calcium Carb-Cholecalciferol (CALCIUM-VITAMIN D) 600-400 MG-UNIT TABS    Sig: Take 1 tablet by mouth daily.  Marland Kitchen acetaminophen (TYLENOL) 500 MG tablet    Sig: Take 1 tablet (500 mg total) by mouth in the morning, at noon, and at bedtime.   Orders Placed This Encounter  Procedures  . DG Thoracic Spine W/Swimmers    Standing Status:   Future    Number of Occurrences:   1    Standing Expiration Date:   12/09/2020    Order Specific Question:   Reason for Exam (SYMPTOM  OR DIAGNOSIS REQUIRED)    Answer:   diffuse thoracic back pain after lifting heavy pot    Order Specific Question:   Preferred imaging location?    Answer:   [facility]    Order Specific Question:   Radiology Contrast Protocol - do NOT remove file path    Answer:   [email or web address]  . DG Bone Density    Standing Status:   Future    Standing Expiration Date:   12/09/2020    Order Specific Question:   Reason for Exam (SYMPTOM  OR DIAGNOSIS REQUIRED)    Answer:   T7 vertebral compression fracture    Comments:   q    Order Specific Question:   Preferred imaging location?    Answer:   [facility]     Patient instructions: Thoracic back xrays today - concern for compression fracture Ask to get vitamin D added to blood work next time you have labs done.  Start calcium 677m/vitamin D 400 units combo pill daily.  Start tylenol 5052m1-2 tablets up to three times a day for pain. If tylenol doesn't cover pain, let me know to discuss stronger pain medicine vs calcitonin. I  will order bone density scan to further evaluate for osteoporosis. We should consider bone strengthening medication.  Keep me updated with how you do with pain control.   Follow up plan: Return if symptoms worsen or fail to improve.  [name], MD

## 2019-12-10 NOTE — Patient Instructions (Addendum)
Thoracic back xrays today - concern for compression fracture Ask to get vitamin D added to blood work next time you have labs done.  Start calcium 619m/vitamin D 400 units combo pill daily.  Start tylenol 5029m1-2 tablets up to three times a day for pain. If tylenol doesn't cover pain, let me know to discuss stronger pain medicine vs calcitonin. I will order bone density scan to further evaluate for osteoporosis. We should consider bone strengthening medication.  Keep me updated with how you do with pain control.   Spinal Compression Fracture  A spinal compression fracture is a collapse of the bones that form the spine (vertebrae). With this type of fracture, the vertebrae become pushed (compressed) into a wedge shape. Most compression fractures happen in the middle or lower part of the spine. What are the causes? This condition may be caused by:  Thinning and loss of density in the bones (osteoporosis). This is the most common cause.  A fall.  A car or motorcycle accident.  Cancer.  Trauma, such as a heavy, direct hit to the head or back. What increases the risk? You are more likely to develop this condition if:  You are 604ears or older.  You have osteoporosis.  You have certain types of cancer, including: ? Multiple myeloma. ? Lymphoma. ? Prostate cancer. ? Lung cancer. ? Breast cancer. What are the signs or symptoms? Symptoms of this condition include:  Severe pain.  Pain that gets worse over time.  Pain that is worse when you stand, walk, sit, or bend.  Sudden pain that is so bad that it is hard for you to move.  Bending or humping of the spine.  Gradual loss of height.  Numbness, tingling, or weakness in the back and legs.  Trouble walking. Your symptoms will depend on the cause of the fracture and how quickly it develops. How is this diagnosed? This condition may be diagnosed based on symptoms, medical history, and a physical exam. During the physical  exam, your health care provider may tap along the length of your spine to check for tenderness. Tests may be done to confirm the diagnosis. They may include:  A bone mineral density test to check for osteoporosis.  Imaging tests, such as a spine X-ray, CT scan, or MRI. How is this treated? Treatment for this condition depends on the cause and severity of the condition. Some fractures may heal on their own with supportive care. Treatment may include:  Pain medicine.  Rest.  A back brace.  Physical therapy exercises.  Medicine to strengthen bone.  Calcium and vitamin D supplements. Fractures that cause the back to become misshapen, cause nerve pain or weakness, or do not respond to other treatment may be treated with surgery. This may include:  Vertebroplasty. Bone cement is injected into the collapsed vertebrae to stabilize them.  Balloon kyphoplasty. The collapsed vertebrae are expanded with a balloon and then bone cement is injected into them.  Spinal fusion. The collapsed vertebrae are connected (fused) to normal vertebrae. Follow these instructions at home: Medicines  Take over-the-counter and prescription medicines only as told by your health care provider.  Do not drive or operate heavy machinery while taking prescription pain medicine.  If you are taking prescription pain medicine, take actions to prevent or treat constipation. Your health care provider may recommend that you: ? Drink enough fluid to keep your urine pale yellow. ? Eat foods that are high in fiber, such as fresh fruits and  vegetables, whole grains, and beans. ? Limit foods that are high in fat and processed sugars, such as fried or sweet foods. ? Take an over-the-counter or prescription medicine for constipation. If you have a brace:  Wear the brace as told by your health care provider. Remove it only as told by your health care provider.  Loosen the brace if your fingers or toes tingle, become numb, or  turn cold and blue.  Keep the brace clean.  If the brace is not waterproof: ? Do not let it get wet. ? Cover it with a watertight covering when you take a bath or a shower. Managing pain, stiffness, and swelling   If directed, apply ice to the injured area: ? If you have a removable brace, remove it as told by your health care provider. ? Put ice in a plastic bag. ? Place a towel between your skin and the bag. ? Leave the ice on for 30 minutes every two hours at first. Then apply the ice as needed. Activity  Rest as told by your health care provider. ? Avoid sitting for a long time without moving. Get up to take short walks every 1-2 hours. This is important to improve blood flow and breathing. Ask for help if you feel weak or unsteady.  Return to your normal activities as directed by your health care provider. Ask what activities are safe for you.  Do exercises to improve motion and strength in your back (physical therapy), as recommended by your health care provider.  Exercise regularly as directed by your health care provider. General instructions   Do not drink alcohol. Alcohol can interfere with your treatment.  Do not use any products that contain nicotine or tobacco, such as cigarettes and e-cigarettes. These can delay bone healing. If you need help quitting, ask your health care provider.  Keep all follow-up visits as told by your health care provider. This is important. It can help to prevent permanent injury, disability, and long-lasting (chronic) pain. Contact a health care provider if:  You have a fever.  You develop a cough that makes your pain worse.  Your pain medicine is not helping.  Your pain does not get better over time.  You cannot return to your normal activities as planned or expected. Get help right away if:  Your pain is very bad and it suddenly gets worse.  You are unable to move any body part (paralysis) that is below the level of your  injury.  You have numbness, tingling, or weakness in any body part that is below the level of your injury.  You cannot control your bladder or bowels. Summary  A spinal compression fracture is a collapse of the bones that form the spine (vertebrae).  With this type of fracture, the vertebrae become pushed (compressed) into a wedge shape.  Your symptoms and treatment will depend on the cause and severity of the fracture and how quickly it develops.  Some fractures may heal on their own with supportive care. Fractures that cause the back to become misshapen, cause nerve pain or weakness, or do not respond to other treatment may be treated with surgery. This information is not intended to replace advice given to you by your health care provider. Make sure you discuss any questions you have with your health care provider. Document Revised: 06/22/2018 Document Reviewed: 06/07/2017 Elsevier Patient Education  2020 [employer].

## 2019-12-24 ENCOUNTER — Ambulatory Visit (INDEPENDENT_AMBULATORY_CARE_PROVIDER_SITE_OTHER): Payer: Medicare Other | Admitting: Internal Medicine

## 2019-12-24 ENCOUNTER — Encounter: Payer: Self-pay | Admitting: Internal Medicine

## 2019-12-24 VITALS — BP 120/78 | HR 79 | Ht 59.0 in | Wt 144.2 lb

## 2019-12-24 DIAGNOSIS — Z8616 Personal history of COVID-19: Secondary | ICD-10-CM

## 2019-12-24 DIAGNOSIS — K5130 Ulcerative (chronic) rectosigmoiditis without complications: Secondary | ICD-10-CM | POA: Diagnosis not present

## 2019-12-24 DIAGNOSIS — K5909 Other constipation: Secondary | ICD-10-CM

## 2019-12-24 MED ORDER — SULFASALAZINE 500 MG PO TABS: ORAL_TABLET | ORAL | 1 refills | Status: DC

## 2019-12-24 MED ORDER — MESALAMINE 4 G RE ENEM: 4.0000 g | ENEMA | Freq: Every day | RECTAL | 1 refills | Status: DC

## 2019-12-24 NOTE — Progress Notes (Signed)
Subjective:    Patient ID: Diana Brewer, female    DOB: 28-Jul-1933, 86 y.o.   MRN: [id number]  HPI Diana Brewer is an 86 year old female with a history of distal proctosigmoiditis, mild constipation, hyperlipidemia, mitral valve prolapse, hypertension, COVID-19 in November 2020 who is seen for follow-up.  She is seen in person today and was last seen by telehealth visit in May 2021.  She is here alone today.  She reports that recently she has been dealing with back pain associated with what is felt to be a T7 compression fracture.  This is week 5 dealing with the pain and it seems to be improving though slowly.  She is taking calcium and vitamin D and Tylenol as needed.  From a colon perspective she reports her symptoms are better.  We had planned a CT scan of her abdomen due to left-sided abdominal pain after her visit in May however she stopped her MiraLAX and her left-sided discomfort has improved significantly.  It is not hurting at all recently.  She is using Benefiber 2 heaping tablespoons once daily.  She is continued her mesalamine enemas 2 g at bedtime, sulfasalazine 1 g twice daily and folate supplementation.  She has not had any rectal bleeding.  Stools have been formed without diarrhea.  No heartburn.  No recent nausea or vomiting though she did have significant nausea associated with Covid and particularly in the weeks thereafter.  She still reports her energy has not returned completely to normal.   Review of Systems As per HPI, otherwise negative  Current Medications, Allergies, Past Medical History, Past Surgical History, Family History and Social History were reviewed in [employer] record.     Objective:   Physical Exam BP 120/78   Pulse 79   Ht 4' 11"  (1.499 m)   Wt 144 lb 4 oz (65.4 kg)   BMI 29.13 kg/m  Gen: awake, alert, NAD HEENT: anicteric CV: RRR, soft systolic ejection murmur Pulm: CTA b/l Abd: soft, NT/ND, +BS  throughout Rectal: Normal external exam, normal rectal tone, nontender, no masses, light brown heme-negative stool in the rectal vault Ext: no c/c/e Neuro: nonfocal  CMP     Component Value Date/Time   NA 143 10/01/2019 1140   K 4.7 10/01/2019 1140   CL 105 10/01/2019 1140   CO2 24 10/01/2019 1140   GLUCOSE 92 10/01/2019 1140   GLUCOSE 85 03/10/2016 0814   BUN 19 10/01/2019 1140   CREATININE 1.39 (H) 10/01/2019 1140   CREATININE 1.24 (H) 03/10/2016 0814   CALCIUM 9.6 10/01/2019 1140   PROT 6.2 10/01/2019 1140   ALBUMIN 4.4 10/01/2019 1140   AST 18 10/01/2019 1140   ALT 16 10/01/2019 1140   ALKPHOS 73 10/01/2019 1140   BILITOT 0.3 10/01/2019 1140   GFRNONAA 35 (L) 10/01/2019 1140   GFRAA 40 (L) 10/01/2019 1140   CBC    Component Value Date/Time   WBC 7.1 10/01/2019 1140   WBC 7.0 07/03/2018 0909   RBC 3.08 (L) 10/01/2019 1140   RBC 3.37 (L) 07/03/2018 0909   HGB 10.7 (L) 10/01/2019 1140   HCT 32.0 (L) 10/01/2019 1140   PLT 365 10/01/2019 1140   MCV 104 (H) 10/01/2019 1140   MCH 34.7 (H) 10/01/2019 1140   MCH 32.3 07/03/2018 0909   MCHC 33.4 10/01/2019 1140   MCHC 31.8 07/03/2018 0909   RDW 11.8 10/01/2019 1140   LYMPHSABS 1.5 10/01/2019 1140   MONOABS 0.7 07/03/2018 0909  EOSABS 0.3 10/01/2019 1140   BASOSABS 0.1 10/01/2019 1140       Assessment & Plan:  86 year old female with a history of distal proctosigmoiditis, mild constipation, hyperlipidemia, mitral valve prolapse, hypertension, COVID-19 in November 2020 who is seen for follow-up.  1. Ulcerative proctosigmoiditis --her proctosigmoiditis seems under good control at present on current therapy.  No alarm features.  Given resolution of abdominal pain I do not think she needs a CT scan at this point.  We will continue as follows --Continue mesalamine enema 2 g nightly --Continue sulfasalazine 1 g twice daily with folic acid 1 mg daily  2.  Constipation --regular bowel habits now Benefiber, continue  Benefiber daily.  She is stopped MiraLAX as it was causing some left-sided abdominal cramping and discomfort  3.  History of COVID-19 --she had questions today regarding COVID-19 vaccination.  We spent time discussing this and my recommendation to her would be to proceed with COVID-19 vaccination.  She is hesitant but wanted my opinion.  30 minutes total spent today including patient facing time, coordination of care, reviewing medical history/procedures/pertinent radiology studies, and documentation of the encounter.

## 2019-12-24 NOTE — Patient Instructions (Addendum)
Continue benefiber, folate, mesalamine and sulfasalazine 1 gram twice daily.  Please follow up with Dr Hilarie Fredrickson in 1 year.  If you are age 84 or older, your body mass index should be between 23-30. Your Body mass index is 29.13 kg/m. If this is out of the aforementioned range listed, please consider follow up with your Primary Care Provider.  If you are age 76 or younger, your body mass index should be between 19-25. Your Body mass index is 29.13 kg/m. If this is out of the aformentioned range listed, please consider follow up with your Primary Care Provider.   Due to recent changes in healthcare laws, you may see the results of your imaging and laboratory studies on MyChart before your provider has had a chance to review them.  We understand that in some cases there may be results that are confusing or concerning to you. Not all laboratory results come back in the same time frame and the provider may be waiting for multiple results in order to interpret others.  Please give [place] 48 hours in order for your provider to thoroughly review all the results before contacting the office for clarification of your results.

## 2020-01-29 ENCOUNTER — Other Ambulatory Visit: Payer: Self-pay | Admitting: Cardiovascular Disease

## 2020-01-29 DIAGNOSIS — E7800 Pure hypercholesterolemia, unspecified: Secondary | ICD-10-CM | POA: Diagnosis not present

## 2020-01-29 DIAGNOSIS — Z79899 Other long term (current) drug therapy: Secondary | ICD-10-CM

## 2020-01-29 DIAGNOSIS — E785 Hyperlipidemia, unspecified: Secondary | ICD-10-CM | POA: Diagnosis not present

## 2020-01-29 LAB — COMPREHENSIVE METABOLIC PANEL
ALT: 21 IU/L (ref 0–32)
AST: 26 IU/L (ref 0–40)
Albumin/Globulin Ratio: 2.2 (ref 1.2–2.2)
Albumin: 4.4 g/dL (ref 3.6–4.6)
Alkaline Phosphatase: 93 IU/L (ref 44–121)
BUN/Creatinine Ratio: 16 (ref 12–28)
BUN: 22 mg/dL (ref 8–27)
Bilirubin Total: 0.5 mg/dL (ref 0.0–1.2)
CO2: 26 mmol/L (ref 20–29)
Calcium: 9.7 mg/dL (ref 8.7–10.3)
Chloride: 103 mmol/L (ref 96–106)
Creatinine, Ser: 1.38 mg/dL — ABNORMAL HIGH (ref 0.57–1.00)
GFR calc Af Amer: 41 mL/min/{1.73_m2} — ABNORMAL LOW (ref >59)
GFR calc non Af Amer: 35 mL/min/{1.73_m2} — ABNORMAL LOW (ref >59)
Globulin, Total: 2 g/dL (ref 1.5–4.5)
Glucose: 91 mg/dL (ref 65–99)
Potassium: 5.3 mmol/L — ABNORMAL HIGH (ref 3.5–5.2)
Sodium: 141 mmol/L (ref 134–144)
Total Protein: 6.4 g/dL (ref 6.0–8.5)

## 2020-01-29 LAB — LIPID PANEL
Chol/HDL Ratio: 2.9 ratio (ref 0.0–4.4)
Cholesterol, Total: 185 mg/dL (ref 100–199)
HDL: 63 mg/dL (ref >39)
LDL Chol Calc (NIH): 106 mg/dL — ABNORMAL HIGH (ref 0–99)
Triglycerides: 89 mg/dL (ref 0–149)
VLDL Cholesterol Cal: 16 mg/dL (ref 5–40)

## 2020-01-29 LAB — TSH: TSH: 6.65 u[IU]/mL — ABNORMAL HIGH (ref 0.450–4.500)

## 2020-01-31 DIAGNOSIS — Z79899 Other long term (current) drug therapy: Secondary | ICD-10-CM | POA: Diagnosis not present

## 2020-01-31 LAB — CBC
Hematocrit: 29 % — ABNORMAL LOW (ref 34.0–46.6)
Hemoglobin: 10.2 g/dL — ABNORMAL LOW (ref 11.1–15.9)
MCH: 34.6 pg — ABNORMAL HIGH (ref 26.6–33.0)
MCHC: 35.2 g/dL (ref 31.5–35.7)
MCV: 98 fL — ABNORMAL HIGH (ref 79–97)
Platelets: 358 10*3/uL (ref 150–450)
RBC: 2.95 x10E6/uL — ABNORMAL LOW (ref 3.77–5.28)
RDW: 12 % (ref 11.7–15.4)
WBC: 7.1 10*3/uL (ref 3.4–10.8)

## 2020-02-01 ENCOUNTER — Ambulatory Visit (INDEPENDENT_AMBULATORY_CARE_PROVIDER_SITE_OTHER): Payer: Medicare Other | Admitting: Cardiovascular Disease

## 2020-02-01 ENCOUNTER — Encounter: Payer: Self-pay | Admitting: Cardiovascular Disease

## 2020-02-01 ENCOUNTER — Other Ambulatory Visit: Payer: Self-pay

## 2020-02-01 DIAGNOSIS — I10 Essential (primary) hypertension: Secondary | ICD-10-CM

## 2020-02-01 DIAGNOSIS — Z8616 Personal history of COVID-19: Secondary | ICD-10-CM

## 2020-02-01 DIAGNOSIS — I6523 Occlusion and stenosis of bilateral carotid arteries: Secondary | ICD-10-CM

## 2020-02-01 DIAGNOSIS — R002 Palpitations: Secondary | ICD-10-CM

## 2020-02-01 DIAGNOSIS — E785 Hyperlipidemia, unspecified: Secondary | ICD-10-CM | POA: Diagnosis not present

## 2020-02-01 DIAGNOSIS — I341 Nonrheumatic mitral (valve) prolapse: Secondary | ICD-10-CM | POA: Diagnosis not present

## 2020-02-01 NOTE — Patient Instructions (Signed)
   Follow-Up: At [facility], you and your health needs are our priority.  As part of our continuing mission to provide you with exceptional heart care, we have created designated Provider Care Teams.  These Care Teams include your primary Cardiologist (physician) and Advanced Practice Providers (APPs -  Physician Assistants and Nurse Practitioners) who all work together to provide you with the care you need, when you need it.  We recommend signing up for the patient portal called "MyChart".  Sign up information is provided on this After Visit Summary.  MyChart is used to connect with patients for Virtual Visits (Telemedicine).  Patients are able to view lab/test results, encounter notes, upcoming appointments, etc.  Non-urgent messages can be sent to your provider as well.   To learn more about what you can do with MyChart, go to [email or web address].    Your next appointment:   12 month(s)  The format for your next appointment:   In Person  Provider:   You may see [name], MD or one of the following Advanced Practice Providers on your designated Care Team:    Almyra Deforest, PA-C  Fabian Sharp, PA-C or   Roby Lofts, [place]

## 2020-02-01 NOTE — Progress Notes (Signed)
Cardiology Office Note    Date:  02/01/2020   ID:  ITZELL Brewer, DOB [birth year], MRN [id number]  PCP:  Diana Bush, MD  Cardiologist:  Diana Majestic, MD   F/U evaluation  History of Present Illness:  Diana Brewer is a 86 y.o. female who has been followed by Dr. Mare Brewer almost 30 years.  I have cared for several of her family members. She established cardiology care with me in October 2017.  She presents for a one year follow-up evaluation.  [name] has a long-standing history of hypertension, as well as mitral valve prolapse.  She has a history of hyperlipidemia, as well as chronic inflammatory proctitis.  She has been maintained on propranolol 20 mg twice a day for palpitations and at times has to take extra pill for breakthrough ectopy.  She has complained that her weight has been gradually increasing.  Dr. Mare Brewer had previously checked a TSH.  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  She denies any recent episodes of chest pain.  She denies any change in development of shortness of breath.    When I  saw her, since she was taken propranolol 20 mg twice a day, but at times needed additional doses due to recurrent palpitations  I suggested substituting this for Toprol-XL 25 mg daily with plans to titrate as needed.  However, on the metoprolol preparation she felt that she developed some recurrent migraine headaches, which she had not experienced while she was taken propranolol.  She was switched back to propanolol 40 mg bid which has beneficial with reference to her migraine headaches on this increased beta blocker dose.  Her palpitations also have improved. When  I initially evaluated her, she had a soft carotid bruit on physical exam.  She underwent carotid duplex imaging on 07/21/2016.  This revealed mild heterogenous plaque bilaterally with narrowings in the 1-39% range.  She had normal subclavian and  vertebral arteries bilaterally.  Laboratory in August 2017 had shown an LDL cholesterol of 110 and  in February 2017 LDL was 138.    When I  saw her in March 2018, I reviewed recent subclinical atherosclerosis date on a systematic patients without cardiac risk factors.  His cast development of sclerosis which was found to develop even at LDLs, beginning at 60.  She was hasn't to try a statin.  I instituted Zetia 10 mg.  She has tolerated this well.  She underwent recent laboratory which now showed improvement in her lipid studies such that total cholesterol was 143, triglycerides 96, HDL 52, LDL 72.  Creatinine was 1.24 which had improved from 1.37.    I last saw her in April 2019 at which time she denied any chest pain or palpitations.  She continued to take propranolol 40 mg twice a day and was taking HCTZ 12.5 mg every other day.  Apparently, she has been cutting her Zetia 10 mg in half.  Laboratory one week previously has shown a creatinine of 1.25 which was slightly improved from 2 weeks ago when it was 1.41.  At that time, her HCTZ was reduced to every other day. Lipid studies have shown her LDL cholesterol increased to 97 when 7 months ago it was 72.   When I last saw her in September 2020 she was no longertaking hydrochlorothiazide.  Her palpitations have remained controlled on propranolol 40 mg twice a day.  She continues to take Zetia 10  mg for hyperlipidemia for her subclinical atherosclerosis.  She is not exercising as much as he had in the past due to the Covid pandemic.  She denied chest pain, PND, orthopnea, presyncope or syncope.   Over the past year, she tells me that she developed Covid in November/December 2020.  She also had a T7 thoracic fracture several months ago.  She is had decreased energy since her Covid infection.  She states her blood pressure at home typically runs around 115 to less than 549 systolically.  She denies any chest pain.  She had recent laboratory which showed her  LDL cholesterol at 106, creatinine 1.38, potassium 5.3, TSH 6.6.  She presents for evaluation  Past Medical History:  Diagnosis Date  . 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 History of migraine remote  . History of shingles   . HLD (hyperlipidemia)   . Hypertension   . Macular degeneration    Diana Brewer  . MVP (mitral valve prolapse)   . Palpitations   . Postherpetic neuralgia 12/27/2012  . 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)    Past Surgical History:  Procedure Laterality Date  . BREAST BIOPSY  1975  . COLONOSCOPY  2011   [facility]  . TRACHEOSTOMY  1942   due to Strep Throat (?epiglottitis)    Current Medications: Outpatient Medications Prior to Visit  Medication Sig Dispense Refill  . acetaminophen (TYLENOL) 500 MG tablet Take 1 tablet (500 mg total) by mouth in the morning, at noon, and at bedtime.    . Calcium Carb-Cholecalciferol (CALCIUM-VITAMIN D) 600-400 MG-UNIT TABS Take 1 tablet by mouth daily.    Marland Kitchen ezetimibe (ZETIA) 10 MG tablet Take 1 tablet (10 mg total) by mouth daily. (Patient taking differently: Take 10 mg by mouth every other day. ) 90 tablet 3  . folic acid (FOLVITE) 1 MG tablet Take 1 tablet (1 mg total) by mouth daily.    . Multiple Vitamins-Minerals (CENTRUM SILVER) tablet Take 1 tablet by mouth daily.     . propranolol (INDERAL) 40 MG tablet TAKE 1 TABLET BY MOUTH TWICE A DAY 180 tablet 3  . sulfaSALAzine (AZULFIDINE) 500 MG tablet TAKE 2 TABLETS BY MOUTH TWICE A DAY. 360 tablet 1  . Wheat Dextrin (BENEFIBER PO) 1 tablespoon daily    . mesalamine (ROWASA) 4 g enema Place 60 mLs (4 g total) rectally at bedtime. 1800 mL 1   No facility-administered medications prior to visit.     Allergies:   Codeine, Cortisone, and Guaifenesin er   Social History   Socioeconomic History  . Marital status: Married    Spouse name: Not on file  . Number of children: Not on file  . Years of  education: Not on file  . Highest education level: Not on file  Occupational History  . Not on file  Tobacco Use  . Smoking status: Never Smoker  . Smokeless tobacco: Never Used  Vaping Use  . Vaping Use: Never used  Substance and Sexual Activity  . Alcohol use: No  . Drug use: No  . Sexual activity: Never  Other Topics Concern  . Not on file  Social History Narrative   Lives with husband [name])   School[employer] child (daughter)   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[name]   Activity: walks and active in yard, grows vegetables   Diet: good water, good fruits/vegetables daily   Social Determinants of Health  Financial Resource Strain:   . Difficulty of Paying Living Expenses: Not on file  Food Insecurity:   . Worried About Charity fundraiser in the Last Year: Not on file  . Ran Out of Food in the Last Year: Not on file  Transportation Needs:   . Lack of Transportation (Medical): Not on file  . Lack of Transportation (Non-Medical): Not on file  Physical Activity:   . Days of Exercise per Week: Not on file  . Minutes of Exercise per Session: Not on file  Stress:   . Feeling of Stress : Not on file  Social Connections:   . Frequency of Communication with Friends and Family: Not on file  . Frequency of Social Gatherings with Friends and Family: Not on file  . Attends Religious Services: Not on file  . Active Member of Clubs or Organizations: Not on file  . Attends Archivist Meetings: Not on file  . Marital Status: Not on file     Family History  family history includes Heart disease in her mother; Hypertension in her mother; Lung cancer in her paternal grandfather; Melanoma in her brother and father; Rheum arthritis in her father; Stroke in her mother.   ROS General: Negative; No fevers, chills, or night sweats;  HEENT: Negative; No changes in vision or hearing, sinus congestion, difficulty swallowing Pulmonary: Negative; No cough, wheezing, shortness of  breath, hemoptysis Cardiovascular: Positive for MVP, hypertension, and intermittent palpitations GI: Negative; No nausea, vomiting, diarrhea, or abdominal pain GU: Negative; No dysuria, hematuria, or difficulty voiding Musculoskeletal: Negative; no myalgias, joint pain, or weakness Hematologic/Oncology: Negative; no easy bruising, bleeding Endocrine: Negative; no heat/cold intolerance; no diabetes Neuro: Negative; no changes in balance, headaches Skin: Negative; No rashes or skin lesions Psychiatric: Negative; No behavioral problems, depression Sleep: Negative; No snoring, daytime sleepiness, hypersomnolence, bruxism, restless legs, hypnogognic hallucinations, no cataplexy Other comprehensive 14 point system review is negative.   PHYSICAL EXAM:   VS:  BP 124/80   Pulse 73   Temp (!) 97.2 F (36.2 C)   Ht _0  (1.499 m)   Wt 146 lb 9.6 oz (66.5 kg)   SpO2 94%   BMI 29.61 kg/m     Repeat blood pressure by me today was elevated at 150/78.  However she states she did not take her medications this morning.  Blood pressure at home typically runs between 779 and 390 systolically.  Wt Readings from Last 3 Encounters:  02/01/20 146 lb 9.6 oz (66.5 kg)  12/24/19 144 lb 4 oz (65.4 kg)  12/10/19 143 lb 5 oz (65 kg)    General: Alert, oriented, no distress.  Skin: normal turgor, no rashes, warm and dry HEENT: Normocephalic, atraumatic. Pupils equal round and reactive to light; sclera anicteric; extraocular muscles intact; Nose without nasal septal hypertrophy Mouth/Parynx benign; Mallinpatti scale 3 Neck: No JVD, no carotid bruits; normal carotid upstroke Lungs: clear to ausculatation and percussion; no wheezing or rales Chest wall: without tenderness to palpitation Heart: PMI not displaced, RRR, s1 s2 normal, 1/6 systolic murmur, apical systolic click, no diastolic murmur, no rubs, gallops, thrills, or heaves Abdomen: soft, nontender; no hepatosplenomehaly, BS+; abdominal aorta  nontender and not dilated by palpation. Back: no CVA tenderness Pulses 2+ Musculoskeletal: full range of motion, normal strength, no joint deformities Extremities: no clubbing cyanosis or edema, Homan's sign negative  Neurologic: grossly nonfocal; Cranial nerves grossly wnl Psychologic: Normal mood and affect  Studies/Labs Reviewed:   ECG (independently read by me):  Normal sinus rhythm at 73 bpm.  No ectopy.  Normal intervals.  September 2020 ECG (independently read by me): Normal sinus rhythm at 72 bpm.  Normal intervals.  No ectopy.  April 2019 ECG (independently read by me): Normal sinus rhythm at 74 bpm.  Normal intervals.  Nonspecific T changes.  October 2018 ECG (independently read by me): Normal sinus rhythm at 66 bpm.  No ectopy.  Normal intervals.  March 2018 ECG (independently read by me): NSR at 80; Normal intervals. QTc 405 msec. No ST changes.  January 2018 ECG (independently read by me): Normal sinus rhythm at 85 bpm.  Nonspecific T changes.  QTc interval 445 ms.  October 2017 ECG (independently read by me): Normal sinus rhythm at 63 bpm.  No ectopy.  Normal intervals.  Recent Labs: BMP Latest Ref Rng & Units 01/29/2020 10/01/2019 06/27/2019  Glucose 65 - 99 mg/dL 91 92 98  BUN 8 - 27 mg/dL _0 Creatinine 0.57 - 1.00 mg/dL 1.38(H) 1.39(H) 1.30(H)  BUN/Creat Ratio 12 - _1 Sodium 134 - 144 mmol/L 141 143 138  Potassium 3.5 - 5.2 mmol/L 5.3(H) 4.7 5.3(H)  Chloride 96 - 106 mmol/L 103 105 103  CO2 20 - 29 mmol/L _2 Calcium 8.7 - 10.3 mg/dL 9.7 9.6 9.9     Hepatic Function Latest Ref Rng & Units 01/29/2020 10/01/2019 06/06/2019  Total Protein 6.0 - 8.5 g/dL 6.4 6.2 6.2  Albumin 3.6 - 4.6 g/dL 4.4 4.4 4.2  AST 0 - 40 IU/L _3 ALT 0 - 32 IU/L _4 Alk Phosphatase 44 - 121 IU/L 93 73 92  Total Bilirubin 0.0 - 1.2 mg/dL 0.5 0.3 0.5  Bilirubin, Direct <=0.2 mg/dL - - -    CBC Latest Ref Rng & Units 01/31/2020 10/01/2019 06/06/2019  WBC 3.4  - 10.8 x10E3/uL 7.1 7.1 8.8  Hemoglobin 11.1 - 15.9 g/dL 10.2(L) 10.7(L) 9.8(L)  Hematocrit 34.0 - 46.6 % 29.0(L) 32.0(L) 27.6(L)  Platelets 150 - 450 x10E3/uL 358 365 360   Lab Results  Component Value Date   MCV 98 (H) 01/31/2020   MCV 104 (H) 10/01/2019   MCV 98 (H) 06/06/2019   Lab Results  Component Value Date   TSH 6.650 (H) 01/29/2020   No results found for: HGBA1C   BNP No results found for: BNP  ProBNP No results found for: PROBNP   Lipid Panel     Component Value Date/Time   CHOL 185 01/29/2020 0928   TRIG 89 01/29/2020 0928   HDL 63 01/29/2020 0928   CHOLHDL 2.9 01/29/2020 0928   CHOLHDL 3.4 12/09/2015 0758   VLDL 21 12/09/2015 0758   LDLCALC 106 (H) 01/29/2020 0928   LDLDIRECT 132.6 12/27/2012 1146     RADIOLOGY: No results found.   Additional studies/ records that were reviewed today include:  I reviewed previous records by Dr. Mare Brewer. Recent laboratory was reviewed  ASSESSMENT:    No diagnosis found.  PLAN:  Ms Campisi is an 86 year old female who has a history of mitral valve prolapse, hypertension, and her ECG today remotely she had been treated with propranolol 20 mg twice a day for many years but had developed recurrent palpitations on this therapy.  She did not tolerate the substitution to Toprol-XL and has been back on propranolol  with controlled palpitations on an increased dose at 40 mg twice a day.  She did not take her blood pressure medications today  and her blood pressure today was slightly elevated but she states her blood pressure at home typically runs between 902 and 111 systolic.  She is on Zetia 10 mg.  Her most recent lipid studies were reviewed and LDL was 106.  We discussed the importance of improved diet.  In the past I had discussed subclinical atherosclerosis data and had discussed the possible use of statin therapy which she preferred not to take.  She denies any recent awareness of palpitations.  Her ECG today shows sinus  rhythm without ectopy and normal.  She does have  stage III chronic kidney disease with creatinine stable around 1.38 compared to prior evaluations placing her in stage IIIb category.  Recent potassium was minimally increased at 5.3.  We discussed importance of avoiding high potassium containing foods.  Have suggested follow-up potassium level in 1 week.  She also was found to have mild elevation of TSH at 6.6.  She is followed by Dr. Danise Mina will be seeing her.  She has not been initiated on any replacement she appears stable perhaps this may be contributing to some of her fatigue.  I will see her in 1 year for follow-up evaluation or sooner as needed.   Medication Adjustments/Labs and Tests Ordered: Current medicines are reviewed at length with the patient today.  Concerns regarding medicines are outlined above.  Medication changes, Labs and Tests ordered today are listed in the Patient Instructions below. There are no Patient Instructions on file for this visit. Signed, Diana Majestic, MD, [facility] 02/01/2020 8:28 AM    [place] [address], [place], [place], [place]  [address] Phone: [phone]

## 2020-02-17 ENCOUNTER — Other Ambulatory Visit: Payer: Self-pay | Admitting: Adult Health

## 2020-03-16 ENCOUNTER — Other Ambulatory Visit: Payer: Self-pay | Admitting: Internal Medicine

## 2020-03-19 ENCOUNTER — Other Ambulatory Visit: Payer: Self-pay | Admitting: Cardiovascular Disease

## 2020-04-07 ENCOUNTER — Ambulatory Visit (INDEPENDENT_AMBULATORY_CARE_PROVIDER_SITE_OTHER)
Admission: EM | Admit: 2020-04-07 | Discharge: 2020-04-07 | Disposition: A | Discharge status: Home / Self Care | Payer: Medicare Other | Source: Home / Self Care

## 2020-04-07 ENCOUNTER — Telehealth: Payer: Self-pay | Admitting: *Deleted

## 2020-04-07 ENCOUNTER — Emergency Department (HOSPITAL_COMMUNITY): Payer: Medicare Other

## 2020-04-07 ENCOUNTER — Encounter: Payer: Self-pay | Admitting: Emergency Medicine

## 2020-04-07 ENCOUNTER — Encounter (HOSPITAL_COMMUNITY): Payer: Self-pay

## 2020-04-07 ENCOUNTER — Other Ambulatory Visit: Payer: Self-pay

## 2020-04-07 ENCOUNTER — Observation Stay (HOSPITAL_COMMUNITY)
Admission: EM | Admit: 2020-04-07 | Discharge: 2020-04-08 | Disposition: A | Discharge status: Home / Self Care | Payer: Medicare Other | Attending: Internal Medicine | Admitting: Internal Medicine

## 2020-04-07 DIAGNOSIS — S22060B Wedge compression fracture of T7-T8 vertebra, initial encounter for open fracture: Secondary | ICD-10-CM | POA: Insufficient documentation

## 2020-04-07 DIAGNOSIS — R079 Chest pain, unspecified: Secondary | ICD-10-CM

## 2020-04-07 DIAGNOSIS — N183 Chronic kidney disease, stage 3 unspecified: Secondary | ICD-10-CM | POA: Diagnosis not present

## 2020-04-07 DIAGNOSIS — G8929 Other chronic pain: Secondary | ICD-10-CM | POA: Insufficient documentation

## 2020-04-07 DIAGNOSIS — X58XXXA Exposure to other specified factors, initial encounter: Secondary | ICD-10-CM | POA: Insufficient documentation

## 2020-04-07 DIAGNOSIS — S22060A Wedge compression fracture of T7-T8 vertebra, initial encounter for closed fracture: Secondary | ICD-10-CM | POA: Diagnosis not present

## 2020-04-07 DIAGNOSIS — M546 Pain in thoracic spine: Principal | ICD-10-CM | POA: Insufficient documentation

## 2020-04-07 DIAGNOSIS — I4891 Unspecified atrial fibrillation: Secondary | ICD-10-CM | POA: Diagnosis present

## 2020-04-07 DIAGNOSIS — S22000A Wedge compression fracture of unspecified thoracic vertebra, initial encounter for closed fracture: Secondary | ICD-10-CM

## 2020-04-07 DIAGNOSIS — R9431 Abnormal electrocardiogram [ECG] [EKG]: Secondary | ICD-10-CM | POA: Diagnosis not present

## 2020-04-07 DIAGNOSIS — K5130 Ulcerative (chronic) rectosigmoiditis without complications: Secondary | ICD-10-CM | POA: Diagnosis present

## 2020-04-07 DIAGNOSIS — I129 Hypertensive chronic kidney disease with stage 1 through stage 4 chronic kidney disease, or unspecified chronic kidney disease: Secondary | ICD-10-CM | POA: Diagnosis not present

## 2020-04-07 DIAGNOSIS — M549 Dorsalgia, unspecified: Secondary | ICD-10-CM | POA: Diagnosis present

## 2020-04-07 LAB — CBC WITH DIFFERENTIAL/PLATELET
Abs Immature Granulocytes: 0.04 10*3/uL (ref 0.00–0.07)
Basophils Absolute: 0.1 10*3/uL (ref 0.0–0.1)
Basophils Relative: 1 %
Eosinophils Absolute: 0.1 10*3/uL (ref 0.0–0.5)
Eosinophils Relative: 1 %
HCT: 32.7 % — ABNORMAL LOW (ref 36.0–46.0)
Hemoglobin: 10.8 g/dL — ABNORMAL LOW (ref 12.0–15.0)
Immature Granulocytes: 0 %
Lymphocytes Relative: 9 %
Lymphs Abs: 0.9 10*3/uL (ref 0.7–4.0)
MCH: 34.6 pg — ABNORMAL HIGH (ref 26.0–34.0)
MCHC: 33 g/dL (ref 30.0–36.0)
MCV: 104.8 fL — ABNORMAL HIGH (ref 80.0–100.0)
Monocytes Absolute: 1.2 10*3/uL — ABNORMAL HIGH (ref 0.1–1.0)
Monocytes Relative: 12 %
Neutro Abs: 7.6 10*3/uL (ref 1.7–7.7)
Neutrophils Relative %: 77 %
Platelets: 380 10*3/uL (ref 150–400)
RBC: 3.12 MIL/uL — ABNORMAL LOW (ref 3.87–5.11)
RDW: 14.3 % (ref 11.5–15.5)
WBC: 9.8 10*3/uL (ref 4.0–10.5)
nRBC: 0 % (ref 0.0–0.2)

## 2020-04-07 LAB — BASIC METABOLIC PANEL
Anion gap: 10 (ref 5–15)
BUN: 27 mg/dL — ABNORMAL HIGH (ref 8–23)
CO2: 27 mmol/L (ref 22–32)
Calcium: 9.6 mg/dL (ref 8.9–10.3)
Chloride: 104 mmol/L (ref 98–111)
Creatinine, Ser: 1.58 mg/dL — ABNORMAL HIGH (ref 0.44–1.00)
GFR, Estimated: 32 mL/min — ABNORMAL LOW (ref >60)
Glucose, Bld: 140 mg/dL — ABNORMAL HIGH (ref 70–99)
Potassium: 3.9 mmol/L (ref 3.5–5.1)
Sodium: 141 mmol/L (ref 135–145)

## 2020-04-07 LAB — TROPONIN I (HIGH SENSITIVITY)
Troponin I (High Sensitivity): 16 ng/L (ref <18)
Troponin I (High Sensitivity): 16 ng/L (ref <18)

## 2020-04-07 MED ORDER — METOPROLOL TARTRATE 5 MG/5ML IV SOLN
5.0000 mg | INTRAVENOUS | Status: DC | PRN
  Administered 2020-04-07: 5 mg via INTRAVENOUS
  Filled 2020-04-07: qty 5

## 2020-04-07 NOTE — Telephone Encounter (Signed)
Patient called to schedule an appointment and was transferred to triage because of chest pain. Patient stated that she had chest pain Friday night that was pretty bad. Patient stated that she had a low grade fever Saturday. Patient stated that she has a cough and a little congestion. Patient stated this am her lip started swelling but is better now. Patient was advised that she does need to be evaluated today in person. Patient was advised that she needs to go to an UC tp be evaluated today because of the chest pain and can not bring her into the office because of her other symptoms. Patient stated that she really does not have a much of a cough. Patient was given information on the [facility]. Patient stated that she does not want to go to an UC and wants to come into the office.

## 2020-04-07 NOTE — ED Provider Notes (Signed)
Roderic Palau    CSN: [id number] Arrival date & time: 04/07/20  1522      History   Chief Complaint Chief Complaint  Patient presents with  . Back Pain    HPI Diana Brewer is a 86 y.o. female.   Patient presents with left thoracic back pain which is radiating through her body to her chest x3 days.  She reports having midsternal chest pain 2 days ago and arm numbness yesterday; these symptoms have resolved.  She still thinks she may have strained her back during the weekend putting up Christmas decorations.  She states the back pain is worse with lifting and moving.  She denies focal weakness, facial asymmetry, dizziness, focal weakness, shortness of breath, or other symptoms.  Her medical history includes mitral valve prolapse, rapid palpitations, elevated cholesterol, hypertensive heart disease, CKD, anemia, back pain, compression fracture of T7, postherpetic neuralgia, migraines, macular degeneration.  The history is provided by the patient and medical records.    Past Medical History:  Diagnosis Date  . 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 History of migraine remote  . History of shingles   . HLD (hyperlipidemia)   . Hypertension   . Macular degeneration    [name]  . MVP (mitral valve prolapse)   . Palpitations   . Postherpetic neuralgia 12/27/2012  . Ulcerative colitis ([place])    left sided - proctosigmoiditis ([name])    Patient Active Problem List   Diagnosis Date Noted  . Acute midline thoracic back pain 12/10/2019  . Compression fracture of T7 vertebra ([place]) 12/10/2019  . CKD (chronic kidney disease) stage 3, GFR 30-59 ml/min (H[facility]) 11/26/2019  . Macrocytic anemia 11/26/2019  . History of COVID-19 03/2019  . Medicare annual wellness visit, initial 04/28/2016  . Advanced care planning/counseling discussion 04/28/2016  . 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 Abnormal LFTs (liver function tests) 02/26/2013    . Benign hypertensive heart disease without heart failure 12/27/2012  . Rapid palpitations 12/24/2010  . Hypercholesterolemia 12/24/2010  . 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 MITRAL VALVE PROLAPSE, HX OF 10/14/2009    Past Surgical History:  Procedure Laterality Date  . BREAST BIOPSY  1975  . COLONOSCOPY  2011   [facility]  . TRACHEOSTOMY  1942   due to Strep Throat (?epiglottitis)    OB History   No obstetric history on file.      Home Medications    Prior to Admission medications   Medication Sig Start Date End Date Taking? Authorizing Provider  acetaminophen (TYLENOL) 500 MG tablet Take 1 tablet (500 mg total) by mouth in the morning, at noon, and at bedtime. 12/10/19  Yes [name], MD  ezetimibe (ZETIA) 10 MG tablet TAKE 1 TABLET BY MOUTH EVERY DAY 03/19/20  Yes Troy Sine, MD  folic acid (FOLVITE) 1 MG tablet Take 1 tablet (1 mg total) by mouth daily. 11/26/19  Yes [name], MD  mesalamine (ROWASA) 4 g enema PLACE 60 MLS (4 G TOTAL) RECTALLY AT BEDTIME. 03/17/20 04/16/20 Yes [name], [name], MD  Multiple Vitamins-Minerals (CENTRUM SILVER) tablet Take 1 tablet by mouth daily.    Yes [provider]  propranolol (INDERAL) 40 MG tablet TAKE 1 TABLET BY MOUTH TWICE A DAY 02/18/20  Yes Troy Sine, MD  sulfaSALAzine (AZULFIDINE) 500 MG tablet TAKE 2 TABLETS BY MOUTH TWICE A DAY. 12/24/19  Yes [name], [name], MD  Wheat Dextrin (BENEFIBER PO) 1 tablespoon  daily   Yes [provider]  Calcium Carb-Cholecalciferol (CALCIUM-VITAMIN D) 600-400 MG-UNIT TABS Take 1 tablet by mouth daily. 12/10/19   [name], MD    Family History Family History  Problem Relation Age of Onset  . Hypertension Mother   . Stroke Mother   . Heart disease Mother   . Rheum arthritis Father   . Melanoma Father   . Melanoma Brother   . Lung cancer Paternal Grandfather   . Colon cancer Neg Hx   . Esophageal cancer Neg Hx   . Pancreatic cancer Neg Hx    . Stomach cancer Neg Hx     Social History Social History   Tobacco Use  . Smoking status: Never Smoker  . Smokeless tobacco: Never Used  Vaping Use  . Vaping Use: Never used  Substance Use Topics  . Alcohol use: No  . Drug use: No     Allergies   Codeine, Cortisone, and Guaifenesin er   Review of Systems Review of Systems  Constitutional: Negative for chills and fever.  HENT: Negative for ear pain and sore throat.   Eyes: Negative for pain and visual disturbance.  Respiratory: Negative for cough and shortness of breath.   Cardiovascular: Positive for chest pain. Negative for palpitations.  Gastrointestinal: Negative for abdominal pain, nausea and vomiting.  Genitourinary: Negative for dysuria and hematuria.  Musculoskeletal: Positive for back pain. Negative for arthralgias.  Skin: Negative for color change and rash.  Neurological: Positive for numbness. Negative for dizziness, seizures, syncope, facial asymmetry, speech difficulty, weakness and headaches.  All other systems reviewed and are negative.    Physical Exam Triage Vital Signs ED Triage Vitals [04/07/20 1536]  Enc Vitals Group     BP      Pulse      Resp      Temp      Temp src      SpO2      Weight      Height      Head Circumference      Peak Flow      Pain Score 8     Pain Loc      Pain Edu?      Excl. in [place]?    No data found.  Updated Vital Signs BP (!) 149/75 (BP Location: Left Arm)   Pulse 89   Temp 98.5 F (36.9 C) (Oral)   Resp 18   SpO2 93%   Visual Acuity Right Eye Distance:   Left Eye Distance:   Bilateral Distance:    Right Eye Near:   Left Eye Near:    Bilateral Near:     Physical Exam Vitals and nursing note reviewed.  Constitutional:      General: She is not in acute distress.    Appearance: She is well-developed.  HENT:     Head: Normocephalic and atraumatic.     Mouth/Throat:     Mouth: Mucous membranes are moist.  Eyes:     Conjunctiva/sclera:  Conjunctivae normal.  Cardiovascular:     Rate and Rhythm: Normal rate and regular rhythm.     Heart sounds: Normal heart sounds.  Pulmonary:     Effort: Pulmonary effort is normal. No respiratory distress.     Breath sounds: Normal breath sounds.  Abdominal:     Palpations: Abdomen is soft.     Tenderness: There is no abdominal tenderness.  Musculoskeletal:     Cervical back: Neck supple.     Right  lower leg: No edema.     Left lower leg: No edema.  Skin:    General: Skin is warm and dry.  Neurological:     General: No focal deficit present.     Mental Status: She is alert and oriented to person, place, and time.     Sensory: No sensory deficit.     Motor: No weakness.     Gait: Gait normal.  Psychiatric:        Mood and Affect: Mood normal.        Behavior: Behavior normal.      UC Treatments / Results  Labs (all labs ordered are listed, but only abnormal results are displayed) Labs Reviewed - No data to display  EKG   Radiology No results found.  Procedures Procedures (including critical care time)  Medications Ordered in UC Medications - No data to display  Initial Impression / Assessment and Plan / UC Course  I have reviewed the triage vital signs and the nursing notes.  Pertinent labs & imaging results that were available during my care of the patient were reviewed by me and considered in my medical decision making (see chart for details).   Back pain, chest pain.  EKG shows sinus rhythm, rate 86, compared to previous from 02/01/2020.  Discussed limitations of evaluation of her symptoms in an urgent care.  Instructed patient to go to the ED for evaluation.  Her husband drove her here and will drive her to the ED.  She plans to go to [facility] ED; she declines EMS; her husband will drive her.    Final Clinical Impressions(s) / UC Diagnoses   Final diagnoses:  Acute left-sided thoracic back pain  Chest pain, unspecified type     Discharge  Instructions     Go to the Emergency Department for evaluation of your back pain radiating through to your chest, chest pain, hand numbness.        ED Prescriptions    None     I have reviewed the PDMP during this encounter.   Sharion Balloon, NP 04/07/20 1645

## 2020-04-07 NOTE — Discharge Instructions (Addendum)
Regarding your back pain, follow-up with neurosurgery.  Regarding your chest pain, follow-up with your primary doctor.  If you develop worsening chest pain, difficulty breathing or other new concerning symptom, return to ER for reassessment.  Regarding your atrial fibrillation, please follow-up with the atrial fibrillation clinic.  You need to discuss with them the risks versus benefits of anticoagulation.  Given your history of bleeding, you also need to discuss with them whether you are a good candidate for the Watchman procedure.  Return if you are having any problems.

## 2020-04-07 NOTE — ED Notes (Addendum)
Pt HR noted to be around 140-160. MD notified.

## 2020-04-07 NOTE — Discharge Instructions (Addendum)
Go to the Emergency Department for evaluation of your back pain radiating through to your chest, chest pain, hand numbness.

## 2020-04-07 NOTE — Telephone Encounter (Signed)
Noted. Thanks.

## 2020-04-07 NOTE — Telephone Encounter (Signed)
Seen at [facility], sent to ER. Awaiting records.

## 2020-04-07 NOTE — ED Triage Notes (Signed)
Patient states she went to an UC today because she continues to have back pain from a previous back fracture. Patient had an EKG and the provider at the UC told the patient to come to the ED because there were EKG changes today.  Patient denies any pain in her chest at this time. Paitent states the pain occurred when she was sitting a certain way and when she got up and moved the pain went away

## 2020-04-07 NOTE — ED Notes (Signed)
Patient is being discharged from the Urgent Care and sent to the Emergency Department via [place] . Per Claiborne Billings NP, patient is in need of higher level of care due to Non-specific Back Pain and Patient's history. Patient is aware and verbalizes understanding of plan of care.  Vitals:   04/07/20 1542  BP: (!) 149/75  Pulse: 89  Resp: 18  Temp: 98.5 F (36.9 C)  SpO2: 93%

## 2020-04-07 NOTE — ED Triage Notes (Signed)
Patient c/o LFT sided back pain x 3 days.  Patient states chest pain began on onset of symptoms, chest pain has now went away.   Patient endorses arm numbness yesterday bilaterally.  Patient endorses reporting to cardiologist and PCP about symptoms.  Patient states "my pain gets worst upon lifting and moving".   Patient has used Tylenol w/ no relief of symptoms.    Patient endorses this weekend she did some lifting at home.    History of Spinal Fracture (July 2021)

## 2020-04-07 NOTE — Telephone Encounter (Signed)
I also spoke with pt trying to encourage her to be seen at [facility] due to chest pain plus other sxs.  Pt agrees to go to UC.

## 2020-04-08 DIAGNOSIS — M546 Pain in thoracic spine: Secondary | ICD-10-CM | POA: Diagnosis not present

## 2020-04-08 DIAGNOSIS — K5130 Ulcerative (chronic) rectosigmoiditis without complications: Secondary | ICD-10-CM | POA: Diagnosis not present

## 2020-04-08 DIAGNOSIS — I4891 Unspecified atrial fibrillation: Secondary | ICD-10-CM | POA: Diagnosis not present

## 2020-04-08 DIAGNOSIS — N183 Chronic kidney disease, stage 3 unspecified: Secondary | ICD-10-CM | POA: Diagnosis not present

## 2020-04-08 MED ORDER — APIXABAN 2.5 MG PO TABS: 2.5000 mg | ORAL_TABLET | Freq: Two times a day (BID) | ORAL | 0 refills | Status: DC

## 2020-04-08 MED ORDER — ONDANSETRON HCL 4 MG/2ML IJ SOLN: 4.0000 mg | Freq: Four times a day (QID) | INTRAMUSCULAR | Status: DC | PRN

## 2020-04-08 MED ORDER — ACETAMINOPHEN 325 MG PO TABS: 650.0000 mg | ORAL_TABLET | ORAL | Status: DC | PRN

## 2020-04-08 MED ORDER — DILTIAZEM HCL ER COATED BEADS 120 MG PO CP24: 120.0000 mg | ORAL_CAPSULE | Freq: Every day | ORAL | 0 refills | Status: DC

## 2020-04-08 MED ORDER — APIXABAN 2.5 MG PO TABS
2.5000 mg | ORAL_TABLET | Freq: Once | ORAL | Status: DC
Start: 2020-04-08 — End: 2020-04-08

## 2020-04-08 MED ORDER — CHLORHEXIDINE GLUCONATE CLOTH 2 % EX PADS: 6.0000 | MEDICATED_PAD | Freq: Every day | CUTANEOUS | Status: DC

## 2020-04-08 MED ORDER — DILTIAZEM HCL-DEXTROSE 125-5 MG/125ML-% IV SOLN (PREMIX)
5.0000 mg/h | INTRAVENOUS | Status: DC
  Administered 2020-04-08: 5 mg/h via INTRAVENOUS
  Filled 2020-04-08: qty 125

## 2020-04-08 MED ORDER — APIXABAN 5 MG PO TABS
5.0000 mg | ORAL_TABLET | Freq: Two times a day (BID) | ORAL | Status: DC
  Filled 2020-04-08: qty 1

## 2020-04-08 MED ORDER — DILTIAZEM LOAD VIA INFUSION
15.0000 mg | Freq: Once | INTRAVENOUS | Status: DC
  Filled 2020-04-08: qty 15

## 2020-04-08 NOTE — ED Provider Notes (Signed)
[facility] DEPT Provider Note   CSN: [id number] Arrival date & time: 04/07/20  1708     History No chief complaint on file.   Diana Brewer is a 86 y.o. female.  Presents to ER with concern for chest pain, back pain.  Patient reports that she suffered from back pain over the last few months ever since she had a fall, diagnosed with T7 fracture.  States that pain is worse with movement, aggravated when she does heavy lifting.  This weekend was doing a lot of Christmas decorations and felt like she aggravated her back.  Also had some pain in the center of her chest.  This occurred at rest, not associated with exertion, she did associate this with certain movements.  No pain at present.  Over the weekend also had an episode of tingling in her left hand as well as slight tingling sensation in her left lip.  No pain or tingling right now.  Went to urgent care and they recommended she go to ER for further eval.  Patient has history of mitral valve prolapse, palpitations, seen by cardiology and is managed on propanolol.  No prior history of A. fib.  HPI     Past Medical History:  Diagnosis Date  . 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 History of migraine remote  . History of shingles   . HLD (hyperlipidemia)   . Hypertension   . Macular degeneration    [name]  . MVP (mitral valve prolapse)   . Palpitations   . Postherpetic neuralgia 12/27/2012  . Ulcerative colitis ([place])    left sided - proctosigmoiditis ([name])    Patient Active Problem List   Diagnosis Date Noted  . Acute midline thoracic back pain 12/10/2019  . Compression fracture of T7 vertebra ([place]) 12/10/2019  . CKD (chronic kidney disease) stage 3, GFR 30-59 ml/min (HCC) 11/26/2019  . Macrocytic anemia 11/26/2019  . History of COVID-19 03/2019  . Medicare annual wellness visit, initial 04/28/2016  . Advanced care planning/counseling  discussion 04/28/2016  . 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 Abnormal LFTs (liver function tests) 02/26/2013  . Benign hypertensive heart disease without heart failure 12/27/2012  . Rapid palpitations 12/24/2010  . Hypercholesterolemia 12/24/2010  . 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 MITRAL VALVE PROLAPSE, HX OF 10/14/2009    Past Surgical History:  Procedure Laterality Date  . BREAST BIOPSY  1975  . COLONOSCOPY  2011   [facility]  . TRACHEOSTOMY  1942   due to Strep Throat (?epiglottitis)     OB History   No obstetric history on file.     Family History  Problem Relation Age of Onset  . Hypertension Mother   . Stroke Mother   . Heart disease Mother   . Rheum arthritis Father   . Melanoma Father   . Melanoma Brother   . Lung cancer Paternal Grandfather   . Colon cancer Neg Hx   . Esophageal cancer Neg Hx   . Pancreatic cancer Neg Hx   . Stomach cancer Neg Hx     Social History   Tobacco Use  . Smoking status: Never Smoker  . Smokeless tobacco: Never Used  Vaping Use  . Vaping Use: Never used  Substance Use Topics  . Alcohol use: No  . Drug use: No    Home Medications Prior to Admission medications   Medication Sig Start Date End Date Taking? Authorizing Provider  acetaminophen (  TYLENOL) 500 MG tablet Take 1 tablet (500 mg total) by mouth in the morning, at noon, and at bedtime. 12/10/19   [name], MD  Calcium Carb-Cholecalciferol (CALCIUM-VITAMIN D) 600-400 MG-UNIT TABS Take 1 tablet by mouth daily. 12/10/19   [name], MD  ezetimibe (ZETIA) 10 MG tablet TAKE 1 TABLET BY MOUTH EVERY DAY 03/19/20   Troy Sine, MD  folic acid (FOLVITE) 1 MG tablet Take 1 tablet (1 mg total) by mouth daily. 11/26/19   [name], MD  mesalamine (ROWASA) 4 g enema PLACE 60 MLS (4 G TOTAL) RECTALLY AT BEDTIME. 03/17/20 04/16/20  [name], [name], MD  Multiple Vitamins-Minerals (CENTRUM SILVER) tablet Take 1 tablet by mouth daily.     [provider]    propranolol (INDERAL) 40 MG tablet TAKE 1 TABLET BY MOUTH TWICE A DAY 02/18/20   Troy Sine, MD  sulfaSALAzine (AZULFIDINE) 500 MG tablet TAKE 2 TABLETS BY MOUTH TWICE A DAY. 12/24/19   [name], [name], MD  Wheat Dextrin (BENEFIBER PO) 1 tablespoon daily    [provider]    Allergies    Codeine, Cortisone, and Guaifenesin er  Review of Systems   Review of Systems  Constitutional: Negative for chills and fever.  HENT: Negative for ear pain and sore throat.   Eyes: Negative for pain and visual disturbance.  Respiratory: Negative for cough and shortness of breath.   Cardiovascular: Positive for chest pain. Negative for palpitations.  Gastrointestinal: Negative for abdominal pain and vomiting.  Genitourinary: Negative for dysuria and hematuria.  Musculoskeletal: Positive for back pain. Negative for arthralgias.  Skin: Negative for color change and rash.  Neurological: Negative for seizures and syncope.  All other systems reviewed and are negative.   Physical Exam Updated Vital Signs BP (!) 162/78   Pulse 99   Temp 98.2 F (36.8 C) (Oral)   Resp (!) 25   Ht 5' 2"  (1.575 m)   Wt 65.8 kg   SpO2 94%   BMI 26.52 kg/m   Physical Exam Vitals and nursing note reviewed.  Constitutional:      General: She is not in acute distress.    Appearance: She is well-developed.  HENT:     Head: Normocephalic and atraumatic.  Eyes:     Conjunctiva/sclera: Conjunctivae normal.  Cardiovascular:     Rate and Rhythm: Normal rate and regular rhythm.     Heart sounds: No murmur heard.   Pulmonary:     Effort: Pulmonary effort is normal. No respiratory distress.     Breath sounds: Normal breath sounds.  Abdominal:     Palpations: Abdomen is soft.     Tenderness: There is no abdominal tenderness.  Musculoskeletal:     Cervical back: Neck supple.  Skin:    General: Skin is warm and dry.  Neurological:     General: No focal deficit present.     Mental Status: She is alert and  oriented to person, place, and time.  Psychiatric:        Mood and Affect: Mood normal.        Behavior: Behavior normal.        Thought Content: Thought content normal.     ED Results / Procedures / Treatments   Labs (all labs ordered are listed, but only abnormal results are displayed) Labs Reviewed  CBC WITH DIFFERENTIAL/PLATELET - Abnormal; Notable for the following components:      Result Value   RBC 3.12 (*)  Hemoglobin 10.8 (*)    HCT 32.7 (*)    MCV 104.8 (*)    MCH 34.6 (*)    Monocytes Absolute 1.2 (*)    All other components within normal limits  BASIC METABOLIC PANEL - Abnormal; Notable for the following components:   Glucose, Bld 140 (*)    BUN 27 (*)    Creatinine, Ser 1.58 (*)    GFR, Estimated 32 (*)    All other components within normal limits  RESP PANEL BY RT-PCR (FLU A&B, COVID) [id number]  TROPONIN I (HIGH SENSITIVITY)  TROPONIN I (HIGH SENSITIVITY)    EKG EKG Interpretation  Date/Time:  Monday April 07 2020 22:44:10 EST Ventricular Rate:  154 PR Interval:    QRS Duration: 68 QT Interval:  246 QTC Calculation: 394 R Axis:   78 Text Interpretation: Atrial fibrillation with rapid V-rate Repolarization abnormality, prob rate related Confirmed by Madalyn Rob [phone]) on 04/08/2020 12:20:14 AM   Radiology DG Chest 2 View  Result Date: 04/07/2020 CLINICAL DATA:  Chest and upper back pain, abnormal EKG EXAM: CHEST - 2 VIEW COMPARISON:  12/10/2019 FINDINGS: Frontal and lateral views of the chest demonstrate an unremarkable cardiac silhouette. No acute airspace disease, effusion, or pneumothorax. Chronic midthoracic wedge compression deformity with progressive loss of height and resulting vertebra plana. No new fractures. IMPRESSION: 1. No acute intrathoracic process. 2. Chronic midthoracic compression fracture with resulting vertebra plana. Electronically Signed   By: Randa Ngo M.D.   On: 04/07/2020 20:08   DG Thoracic Spine 2 View  Result  Date: 04/07/2020 CLINICAL DATA:  Mid upper back pain EXAM: THORACIC SPINE 2 VIEWS COMPARISON:  12/10/2019 FINDINGS: Frontal and lateral views of the thoracic spine demonstrate T7 compression fracture with progressive loss of height and resulting vertebra plana. No new fractures. Stable rotatory scoliosis. Stable spondylosis at the thoracolumbar junction. Paraspinal soft tissues are unremarkable. IMPRESSION: 1. Chronic T7 compression deformity and resulting vertebra plana. 2. Stable spondylosis and scoliosis. Electronically Signed   By: Randa Ngo M.D.   On: 04/07/2020 20:09    Procedures .Critical Care Performed by: Lucrezia Starch, MD Authorized by: Lucrezia Starch, MD   Critical care provider statement:    Critical care time (minutes):  35   Critical care was necessary to treat or prevent imminent or life-threatening deterioration of the following conditions:  Cardiac failure (afib)   Critical care was time spent personally by me on the following activities:  Discussions with consultants, evaluation of patient's response to treatment, examination of patient, ordering and performing treatments and interventions, ordering and review of laboratory studies, ordering and review of radiographic studies, pulse oximetry, re-evaluation of patient's condition, obtaining history from patient or surrogate and review of old charts   (including critical care time)  Medications Ordered in ED Medications  metoprolol tartrate (LOPRESSOR) injection 5 mg (5 mg Intravenous Given 04/07/20 2304)  diltiazem (CARDIZEM) 1 mg/mL load via infusion 15 mg (has no administration in time range)  diltiazem (CARDIZEM) 125 mg in dextrose 5% 125 mL (1 mg/mL) infusion (has no administration in time range)    ED Course  I have reviewed the triage vital signs and the nursing notes.  Pertinent labs & imaging results that were available during my care of the patient were reviewed by me and considered in my medical  decision making (see chart for details).    MDM Rules/Calculators/A&P  86 year old lady presents to the emergency room with concern for chest pain, back pain.  On exam, patient well-appearing in no distress, initially with stable vital signs.  Initial EKG without acute ischemic changes, troponin x2 within normal limits, doubt ACS.  CXR negative.  Thoracic spine consistent with chronic compression fracture at T7.  Initially plan to discharge patient however prior to discharge patient was noted to have elevated heart rate.  Repeat EKG showed A. fib with RVR.  No prior history, patient asymptomatic, BP stable.  Discussed management options with patient including electrical cardioversion, pharmacologic rate or rhythm control, stroke risk, etc.  Patient wishes to proceed with medical management at present.  Patient took one of her home propanolol, received dose of IV metoprolol but had no change in her heart rate.  Will start patient on diltiazem infusion and admit to the hospitalist for further management of her newly diagnosed atrial fibrillation.  Final Clinical Impression(s) / ED Diagnoses Final diagnoses:  Chronic midline back pain, unspecified back location  Chest pain, unspecified type  Compression fracture of body of thoracic vertebra (HCC)  Atrial fibrillation with RVR ([place])    Rx / DC Orders ED Discharge Orders    None       Lucrezia Starch, MD 04/08/20 0025

## 2020-04-08 NOTE — ED Notes (Signed)
Hospitalist in room.

## 2020-04-08 NOTE — Consult Note (Signed)
Medical Consultation   Diana Brewer  UKG:[id number]  DOB: 12-02-1933  DOA: 04/07/2020  PCP: [name], MD  Outpatient Specialists: Dr. Hilarie Fredrickson, Dr. Claiborne Billings  Requesting physician: Dr. Roslynn Amble  Reason for consultation: New onset A.Fib with RVR   History of Present Illness: Diana Brewer is an 86 y.o. female with h/o ulcerative proctitis with infrequent flares (last ~1 yr ago) "comes and goes", HTN, MVP, managed by Dr. Claiborne Billings, prior palpitations, no formal diagnosis of A.Fib previously.  Pt presents to the ED today with c/o chest pain, radiates to back, onset over weekend, symptoms resolved at this time.  Seen initially in UC and sent in to ED.  While in the ED she was asymptomatic with regards to CP.  Trops neg x2.  She then went into A.Fib RVR with a rate of 150.  Despite this pt remained asymptomatic in ED.  Cardizem bolus and GTT started, rate controlled and hospitalist was asked to admit.  By the time I saw patient however, she had completely converted out of A.Fib all together, and is now in NSR with a rate of 81.  She wishes to go home at this time and pursue outpt follow up, and I dont really have a reason to keep her as she remains in NSR.    Review of Systems:  ROS As per HPI otherwise 10 point review of systems negative.     Past Medical History: Past Medical History:  Diagnosis Date  . 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 History of migraine remote  . History of shingles   . HLD (hyperlipidemia)   . Hypertension   . Macular degeneration    [name]  . MVP (mitral valve prolapse)   . Palpitations   . Postherpetic neuralgia 12/27/2012  . Ulcerative colitis ([place])    left sided - proctosigmoiditis ([name])    Past Surgical History: Past Surgical History:  Procedure Laterality Date  . BREAST BIOPSY  1975  . COLONOSCOPY  2011   [facility]  . TRACHEOSTOMY  1942   due to Strep Throat  (?epiglottitis)     Allergies:   Allergies  Allergen Reactions  . Codeine Other (See Comments)    nausea    . Cortisone Swelling    Swelling   . Guaifenesin Er Other (See Comments)    Doesn't work, made her sick     Social History:  reports that she has never smoked. She has never used smokeless tobacco. She reports that she does not drink alcohol and does not use drugs.   Family History: Family History  Problem Relation Age of Onset  . Hypertension Mother   . Stroke Mother   . Heart disease Mother   . Rheum arthritis Father   . Melanoma Father   . Melanoma Brother   . Lung cancer Paternal Grandfather   . Colon cancer Neg Hx   . Esophageal cancer Neg Hx   . Pancreatic cancer Neg Hx   . Stomach cancer Neg Hx       Physical Exam: Vitals:   04/08/20 0300 04/08/20 0415 04/08/20 0430 04/08/20 0445  BP: 134/61 (!) 146/60 92/78 133/64  Pulse: 78 73 81 81  Resp: (!) 23 (!) 21 (!) 25 (!) 26  Temp:      TempSrc:      SpO2: 95% 99% 97% 95%  Weight:  Height:        Constitutional:  Alert and awake, oriented x3, not in any acute distress. Eyes: PERLA, EOMI, irises appear normal, anicteric sclera,  ENMT: external ears and nose appear normal,            Lips appears normal, oropharynx mucosa, tongue, posterior pharynx appear normal  Neck: neck appears normal, no masses, normal ROM, no thyromegaly, no JVD  CVS: S1-S2 clear, no murmur rubs or gallops, no LE edema, normal pedal pulses  Respiratory:  clear to auscultation bilaterally, no wheezing, rales or rhonchi. Respiratory effort normal. No accessory muscle use.  Abdomen: soft nontender, nondistended, normal bowel sounds, no hepatosplenomegaly, no hernias  Musculoskeletal: : no cyanosis, clubbing or edema noted bilaterally  Neuro: Cranial nerves II-XII intact, strength, sensation, reflexes Psych: judgement and insight appear normal, stable mood and affect, mental status Skin: no rashes or lesions or ulcers, no  induration or nodules   Data reviewed:  I have personally reviewed following labs and imaging studies Labs:  CBC: Recent Labs  Lab 04/07/20 1817  WBC 9.8  NEUTROABS 7.6  HGB 10.8*  HCT 32.7*  MCV 104.8*  PLT 627    Basic Metabolic Panel: Recent Labs  Lab 04/07/20 1817  NA 141  K 3.9  CL 104  CO2 27  GLUCOSE 140*  BUN 27*  CREATININE 1.58*  CALCIUM 9.6   GFR Estimated Creatinine Clearance: 23.6 mL/min (A) (by C-G formula based on SCr of 1.58 mg/dL (H)). Liver Function Tests: No results for input(s): AST, ALT, ALKPHOS, BILITOT, PROT, ALBUMIN in the last 168 hours. No results for input(s): LIPASE, AMYLASE in the last 168 hours. No results for input(s): AMMONIA in the last 168 hours. Coagulation profile No results for input(s): INR, PROTIME in the last 168 hours.  Cardiac Enzymes: No results for input(s): CKTOTAL, CKMB, CKMBINDEX, TROPONINI in the last 168 hours. BNP: Invalid input(s): POCBNP CBG: No results for input(s): GLUCAP in the last 168 hours. D-Dimer No results for input(s): DDIMER in the last 72 hours. Hgb A1c No results for input(s): HGBA1C in the last 72 hours. Lipid Profile No results for input(s): CHOL, HDL, LDLCALC, TRIG, CHOLHDL, LDLDIRECT in the last 72 hours. Thyroid function studies No results for input(s): TSH, T4TOTAL, T3FREE, THYROIDAB in the last 72 hours.  Invalid input(s): FREET3 Anemia work up No results for input(s): VITAMINB12, FOLATE, FERRITIN, TIBC, IRON, RETICCTPCT in the last 72 hours. Urinalysis No results found for: COLORURINE, APPEARANCEUR, LABSPEC, [place], GLUCOSEU, HGBUR, BILIRUBINUR, KETONESUR, PROTEINUR, UROBILINOGEN, NITRITE, [place]   Microbiology No results found for this or any previous visit (from the past 240 hour(s)).     Inpatient Medications:   Scheduled Meds: . Chlorhexidine Gluconate Cloth  6 each Topical Q0600  . diltiazem  15 mg Intravenous Once   Continuous Infusions: . diltiazem (CARDIZEM)  infusion Stopped (04/08/20 0052)     Radiological Exams on Admission: DG Chest 2 View  Result Date: 04/07/2020 CLINICAL DATA:  Chest and upper back pain, abnormal EKG EXAM: CHEST - 2 VIEW COMPARISON:  12/10/2019 FINDINGS: Frontal and lateral views of the chest demonstrate an unremarkable cardiac silhouette. No acute airspace disease, effusion, or pneumothorax. Chronic midthoracic wedge compression deformity with progressive loss of height and resulting vertebra plana. No new fractures. IMPRESSION: 1. No acute intrathoracic process. 2. Chronic midthoracic compression fracture with resulting vertebra plana. Electronically Signed   By: Randa Ngo M.D.   On: 04/07/2020 20:08   DG Thoracic Spine 2 View  Result Date: 04/07/2020  CLINICAL DATA:  Mid upper back pain EXAM: THORACIC SPINE 2 VIEWS COMPARISON:  12/10/2019 FINDINGS: Frontal and lateral views of the thoracic spine demonstrate T7 compression fracture with progressive loss of height and resulting vertebra plana. No new fractures. Stable rotatory scoliosis. Stable spondylosis at the thoracolumbar junction. Paraspinal soft tissues are unremarkable. IMPRESSION: 1. Chronic T7 compression deformity and resulting vertebra plana. 2. Stable spondylosis and scoliosis. Electronically Signed   By: Randa Ngo M.D.   On: 04/07/2020 20:09    Impression/Recommendations Principal Problem:   New onset atrial fibrillation (HCC) Active Problems:   Ulcerative proctosigmoiditis (HCC)   CKD (chronic kidney disease) stage 3, GFR 30-59 ml/min (HCC)  1. New onset A.Fib RVR - 1. Pt spontaneously converted to NSR after being on cardizem gtt and bolus. 2. Offered to admit pt still but shed rather go home and continue work up as outpt which seems reasonable. 3. EDP has put in ambulatory referral to A.Fib clinic 4. CHADS-VASC is 4 5. Pt does have a h/o recurrent GIB however due to inflammatory bowel disease (ulcerative proctosigmoiditis). 1. No current active  bleeding, last flare up was ~1 year ago 2. Follows with Dr Hilarie Fredrickson and is on chronic sulfasalazine and melsalamine for this. 6. After EDP curbsided cardiology on call, they recd offering Eliquis for the moment. 7. Extensive discussion with pt regarding blood thinners, risks, benefits, etc. 8. eliquis will be 2.66m PO BID due to age + creat 1.5 today. 9. May want to discuss bleed risk with Dr. PHilarie Fredricksonwhen she follows up in A.Fib clinic / consider candidate for Watchman's procedure? 10. Also being given cardizem ER for rate control   Thank you for this consultation.  [facility] will follow the patient with you.   Time Spent: 60 min  Naitik Hermann M. D.O. [facility] 04/08/2020, 6:07 AM

## 2020-04-08 NOTE — ED Provider Notes (Signed)
Patient had been scheduled for admission because of atrial fibrillation with rapid ventricular response.  Patient spontaneously converted, so decision was made to discharge.  She does have history of GI bleeding secondary to ulcerative proctitis.  Risks and benefits of anticoagulation versus no anticoagulation were discussed, decision was made to discharge her on low-dose apixaban.  This was also discussed with Dr. Paticia Stack of cardiology service who agreed with this approach.  She is also given a prescription for extended release diltiazem for rate control.  CHA2DS2/VAS Stroke Risk Points  Current as of 13 minutes ago     4 >= 2 Points: High Risk  1 - 1.99 Points: Medium Risk  0 Points: Low Risk    No Change      Details    This score determines the patient's risk of having a stroke if the  patient has atrial fibrillation.       Points Metrics  0 Has Congestive Heart Failure:  No    Current as of 13 minutes ago  0 Has Vascular Disease:  No    Current as of 13 minutes ago  1 Has Hypertension:  Yes    Current as of 13 minutes ago  2 Age:  86    Current as of 13 minutes ago  0 Has Diabetes:  No    Current as of 13 minutes ago  0 Had Stroke:  No  Had TIA:  No  Had thromboembolism:  No    Current as of 13 minutes ago  1 Female:  Yes    Current as of 13 minutes ago             Delora Fuel, MD 32/91/91 0500

## 2020-04-09 ENCOUNTER — Telehealth: Payer: Self-pay | Admitting: Internal Medicine

## 2020-04-09 ENCOUNTER — Telehealth: Payer: Self-pay | Admitting: Cardiovascular Disease

## 2020-04-09 NOTE — Telephone Encounter (Signed)
Pt states she was in the ED recently and placed on eliquis and cardizem. The ED doc told her to check with her GI doc to make sure he was ok with her taking these meds. Pt has h/o gi bleed in the past along with ulcerative protosigmoiditis.Please advise.

## 2020-04-09 NOTE — Telephone Encounter (Signed)
Patient called stating she was at the ED yesterday and was prescribed Eliquis and Cardizem medications and was told to contact her GI doctor to check if it's ok for her to take these medications due to her GI condition.

## 2020-04-09 NOTE — Telephone Encounter (Signed)
Pt c/o medication issue:  1. Name of Medication:  apixaban (ELIQUIS) 2.5 MG TABS tablet diltiazem (CARDIZEM CD) 120 MG 24 hr capsule 2. How are you currently taking this medication (dosage and times per day)? Not currently taking   3. Are you having a reaction (difficulty breathing--STAT)? No   4. What is your medication issue? Diana Brewer is calling stating the hospital prescribed her both these medications due to her having Afib that she was not aware was occurring. She states she is wanting to discuss these medications with Dr. Claiborne Billings before she begins taking them due to being concerned. She states when they prescribed her the Eliquis they said "If you don't take the Eliquis you'll have a stroke and if you do take it you could bleed out due to your chronic colon problem". Due to this she is very weary of taking it. She also states they are wanting her to get a Echo performed and she is wanting to receive that through Dr. Claiborne Billings. Please advise.

## 2020-04-09 NOTE — Telephone Encounter (Signed)
I am helping in Triage today. Called and spoke with patient. Discussed recent ED visit where she was diagnosed with new onset atrial fibrillation. I explained what atrial fibrillation and the risk of stroke and reasoning for Eliquis. She voiced understanding. She has a history of ulcerative colitis and has intermittent GI bleeding with this so she is very concerned about a severe GI bleed with anticoagulation. She was very appreciate of me calling but would like to speak with Dr. Claiborne Billings further before starting Eliquis. I will notify Dr. Claiborne Billings. I also recommended patient reach out to her GI doctor (Dr. Hilarie Fredrickson) for his advise.  I recommended patient go ahead and start the Cardizem as prescribed in the ED. She will monitor her BP and HR at home after starting this.   I was able to schedule patient an ED follow-up with Dr. Claiborne Billings on 04/25/2020.  Patient also mentioned concerns about more frequent migraines. Advised patient to follow-up with PCP in regards to this.  Darreld Mclean, PA-C 04/09/2020 10:04 AM

## 2020-04-10 NOTE — Telephone Encounter (Signed)
Spoke with pt and she is aware.

## 2020-04-10 NOTE — Telephone Encounter (Signed)
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

## 2020-04-10 NOTE — Telephone Encounter (Signed)
Called to speak with the patient to relay Dr. Evette Georges recommendation that we discuss her medications more at her next visit on December 17. The patient verbalizes understanding and agreement with plan.

## 2020-04-25 ENCOUNTER — Ambulatory Visit (INDEPENDENT_AMBULATORY_CARE_PROVIDER_SITE_OTHER): Payer: Medicare Other | Admitting: Cardiovascular Disease

## 2020-04-25 ENCOUNTER — Ambulatory Visit (INDEPENDENT_AMBULATORY_CARE_PROVIDER_SITE_OTHER): Payer: Medicare Other

## 2020-04-25 ENCOUNTER — Encounter: Payer: Self-pay | Admitting: Cardiovascular Disease

## 2020-04-25 ENCOUNTER — Other Ambulatory Visit: Payer: Self-pay

## 2020-04-25 VITALS — BP 142/78 | HR 68 | Ht 60.0 in | Wt 145.2 lb

## 2020-04-25 DIAGNOSIS — I4891 Unspecified atrial fibrillation: Secondary | ICD-10-CM

## 2020-04-25 DIAGNOSIS — I480 Paroxysmal atrial fibrillation: Secondary | ICD-10-CM

## 2020-04-25 DIAGNOSIS — E785 Hyperlipidemia, unspecified: Secondary | ICD-10-CM

## 2020-04-25 DIAGNOSIS — I10 Essential (primary) hypertension: Secondary | ICD-10-CM

## 2020-04-25 DIAGNOSIS — K51311 Ulcerative (chronic) rectosigmoiditis with rectal bleeding: Secondary | ICD-10-CM | POA: Diagnosis not present

## 2020-04-25 DIAGNOSIS — I341 Nonrheumatic mitral (valve) prolapse: Secondary | ICD-10-CM

## 2020-04-25 DIAGNOSIS — I6523 Occlusion and stenosis of bilateral carotid arteries: Secondary | ICD-10-CM | POA: Diagnosis not present

## 2020-04-25 MED ORDER — EZETIMIBE 10 MG PO TABS: 10.0000 mg | ORAL_TABLET | Freq: Every day | ORAL | 3 refills | Status: DC

## 2020-04-25 MED ORDER — ASPIRIN EC 81 MG PO TBEC: 81.0000 mg | DELAYED_RELEASE_TABLET | Freq: Every day | ORAL | 11 refills | Status: AC

## 2020-04-25 MED ORDER — DILTIAZEM HCL ER COATED BEADS 180 MG PO CP24: 180.0000 mg | ORAL_CAPSULE | Freq: Every day | ORAL | 1 refills | Status: DC

## 2020-04-25 NOTE — Patient Instructions (Addendum)
Medication Instructions:  INCREASE your cardizem to 180 mg  Take your Zetia 10 mg DAILY as prescribed START taking a baby aspirin by mouth daily *If you need a refill on your cardiac medications before your next appointment, please call your pharmacy*   Lab Work: CMET, CBC, TSH, LIPID PROFILE, MAGNESIUM - FASTING IN 6-8 WEEKS If you have labs (blood work) drawn today and your tests are completely normal, you will receive your results only by:  [place] (if you have MyChart) OR  A paper copy in the mail If you have any lab test that is abnormal or we need to change your treatment, we will call you to review the results.   Testing/Procedures: Your physician has requested that you have an echocardiogram. Echocardiography is a painless test that uses sound waves to create images of your heart. It provides your doctor with information about the size and shape of your heart and how well your hearts chambers and valves are working. This procedure takes approximately one hour. There are no restrictions for this procedure. This test is performed at [address]   ZIO XT- Long Term Monitor Instructions   Your physician has requested you wear your ZIO patch monitor____14___days.   This is a single patch monitor.  [employer] supplies one patch monitor per enrollment.  Additional stickers are not available.   Please do not apply patch if you will be having a Nuclear Stress Test, Echocardiogram, Cardiac CT, MRI, or Chest Xray during the time frame you would be wearing the monitor. The patch cannot be worn during these tests.  You cannot remove and re-apply the ZIO XT patch monitor.   Your ZIO patch monitor will be sent USPS Priority mail from [facility] directly to your home address. The monitor may also be mailed to a PO BOX if home delivery is not available.   It may take 3-5 days to receive your monitor after you have been enrolled.   Once you have received you monitor,  please review enclosed instructions.  Your monitor has already been registered assigning a specific monitor serial # to you.   Applying the monitor   Shave hair from upper left chest.   Hold abrader disc by orange tab.  Rub abrader in 40 strokes over left upper chest as indicated in your monitor instructions.   Clean area with 4 enclosed alcohol pads .  Use all pads to assure are is cleaned thoroughly.  Let dry.   Apply patch as indicated in monitor instructions.  Patch will be place under collarbone on left side of chest with arrow pointing upward.   Rub patch adhesive wings for 2 minutes.Remove white label marked "1".  Remove white label marked "2".  Rub patch adhesive wings for 2 additional minutes.   While looking in a mirror, press and release button in center of patch.  A small green light will flash 3-4 times .  This will be your only indicator the monitor has been turned on.     Do not shower for the first 24 hours.  You may shower after the first 24 hours.   Press button if you feel a symptom. You will hear a small click.  Record Date, Time and Symptom in the Patient Log Book.   When you are ready to remove patch, follow instructions on last 2 pages of Patient Log Book.  Stick patch monitor onto last page of Patient Log Book.   Place Patient Log Book in  Blue box.  Use locking tab on box and tape box closed securely.  The [employer] and [employer] has [employer] on it.  Please place in mailbox as soon as possible.  Your physician should have your test results approximately 7 days after the monitor has been mailed back to [facility].   Call [place] at [phone] if you have questions regarding your ZIO XT patch monitor.  Call them immediately if you see an orange light blinking on your monitor.   If your monitor falls off in less than 4 days contact our Monitor department at [phone].  If your monitor becomes loose or falls off after 4 days call  [employer] at [phone] for suggestions on securing your monitor.     Follow-Up: At [facility], you and your health needs are our priority.  As part of our continuing mission to provide you with exceptional heart care, we have created designated Provider Care Teams.  These Care Teams include your primary Cardiologist (physician) and Advanced Practice Providers (APPs -  Physician Assistants and Nurse Practitioners) who all work together to provide you with the care you need, when you need it.  We recommend signing up for the patient portal called "MyChart".  Sign up information is provided on this After Visit Summary.  MyChart is used to connect with patients for Virtual Visits (Telemedicine).  Patients are able to view lab/test results, encounter notes, upcoming appointments, etc.  Non-urgent messages can be sent to your provider as well.   To learn more about what you can do with MyChart, go to [email or web address].    Your next appointment:   2 month(s)  The format for your next appointment:   In Person  Provider:   [name], MD   Other Instructions None

## 2020-04-25 NOTE — Progress Notes (Signed)
Cardiology Office Note    Date:  04/25/2020   ID:  Diana Brewer, DOB 07/14/1933, MRN [id number]  PCP:  Diana Bush, MD  Cardiologist:  Diana Majestic, MD   F/U evaluation  History of Present Illness:  Diana Brewer is a 85 y.o. female who has been followed by Diana Brewer almost 30 years.  I have cared for several of her family members. She established cardiology care with me in October 2017.  She presents for a follow-up cardiology evaluation after recent ER evaluation on April 07, 2020.  Diana Brewer has a long-standing history of hypertension, as well as mitral valve prolapse.  She has a history of hyperlipidemia, as well as chronic inflammatory proctitis.  She has been maintained on propranolol 20 mg twice a day for palpitations and at times has to take extra pill for breakthrough ectopy.  She has complained that her weight has been gradually increasing.  Diana Brewer had previously checked a TSH.  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  She denies any recent episodes of chest pain.  She denies any change in development of shortness of breath.    When I  saw her, since she was taken propranolol 20 mg twice a day, but at times needed additional doses due to recurrent palpitations  I suggested substituting this for Toprol-XL 25 mg daily with plans to titrate as needed.  However, on the metoprolol preparation she felt that she developed some recurrent migraine headaches, which she had not experienced while she was taken propranolol.  She was switched back to propanolol 40 mg bid which has beneficial with reference to her migraine headaches on this increased beta blocker dose.  Her palpitations also have improved. When  I initially evaluated her, she had a soft carotid bruit on physical exam.  She underwent carotid duplex imaging on 07/21/2016.  This revealed mild heterogenous plaque bilaterally with narrowings in  the 1-39% range.  She had normal subclavian and vertebral arteries bilaterally.  Laboratory in August 2017 had shown an LDL cholesterol of 110 and  in February 2017 LDL was 138.    When I  saw her in March 2018, I reviewed recent subclinical atherosclerosis date on a systematic patients without cardiac risk factors.  His cast development of sclerosis which was found to develop even at LDLs, beginning at 60.  She was hasn't to try a statin.  I instituted Zetia 10 mg.  She has tolerated this well.  She underwent recent laboratory which now showed improvement in her lipid studies such that total cholesterol was 143, triglycerides 96, HDL 52, LDL 72.  Creatinine was 1.24 which had improved from 1.37.    I last saw her in April 2019 at which time she denied any chest pain or palpitations.  She continued to take propranolol 40 mg twice a day and was taking HCTZ 12.5 mg every other day.  Apparently, she has been cutting her Zetia 10 mg in half.  Laboratory one week previously has shown a creatinine of 1.25 which was slightly improved from 2 weeks ago when it was 1.41.  At that time, her HCTZ was reduced to every other day. Lipid studies have shown her LDL cholesterol increased to 97 when 7 months ago it was 72.   When I last saw her in September 2020 she was no longer taking hydrochlorothiazide.  Her palpitations remained controlled on propranolol 40 mg twice a day.  She continues to take Zetia 10 mg for hyperlipidemia for her subclinical atherosclerosis.  She is not exercising as much as he had in the past due to the Covid pandemic.  She denied chest pain, PND, orthopnea, presyncope or syncope.   I last saw her in September 2021.  Over the prior year she developed Covid in November/December 2020.  She also had a T7 thoracic fracture several months ago.  She  had decreased energy since her Covid infection.  She states her blood pressure at home typically runs around 115 to less than 790 systolically.  She denies  any chest pain.  She had recent laboratory which showed her LDL cholesterol at 106, creatinine 1.38, potassium 5.3, TSH 6.6.  We discussed potassium avoidance and suggested follow-up potassium level in 1 week.  Since I last saw her, she had remained stable and has seen Diana Brewer for primary care.  She had developed some vague back discomfort over the weekend with ultimately improved plan to see her primary MD but because of a 1 day history of mild temperature elevation she was advised to go to urgent care.  Apparently she was in atrial fibrillation for which she was asymptomatic.  She was transferred to [facility] ER.  She was given a Cardizem bolus and drip and converted to sinus rhythm and as result was not admitted.  There was discussion concerning initiation of anticoagulation but with the patient's history of ulcerative proctitis with GI bleeds in the past she preferred not to initiate elation.  She was given a prescription for Cardizem slow release 120 mg for rate control.  Since her ER evaluation she denies any awareness of irregular heart rhythm with her pulse typically in the 60s to 70s.  She denies shortness of breath.  She denies any chest pressure with activity.  She presents for evaluation with me.  Past Medical History:  Diagnosis Date  . Arthritis 2016   R wrist predominantly radius scaphoid and midcarpal Diana Brewer)  . History of migraine remote  . History of shingles   . HLD (hyperlipidemia)   . Hypertension   . Macular degeneration    [name]  . MVP (mitral valve prolapse)   . Palpitations   . Postherpetic neuralgia 12/27/2012  . 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 BREAST BIOPSY  1975  . COLONOSCOPY  2011   [facility]  . TRACHEOSTOMY  1942   due to Strep Throat (?epiglottitis)    Current Medications: Outpatient Medications Prior to Visit  Medication Sig Dispense Refill  .  acetaminophen (TYLENOL) 500 MG tablet Take 1 tablet (500 mg total) by mouth in the morning, at noon, and at bedtime.    . folic acid (FOLVITE) 1 MG tablet Take 1 tablet (1 mg total) by mouth daily.    . mesalamine (ROWASA) 4 g enema PLACE 60 MLS (4 G TOTAL) RECTALLY AT BEDTIME. (Patient taking differently: Place 4 g rectally at bedtime. Takes 1/2 an enema daily) 1800 mL 0  . Multiple Vitamins-Minerals (CENTRUM SILVER) tablet Take 1 tablet by mouth daily.    . propranolol (INDERAL) 40 MG tablet TAKE 1 TABLET BY MOUTH TWICE A DAY 180 tablet 3  . sulfaSALAzine (AZULFIDINE) 500 MG tablet TAKE 2 TABLETS BY MOUTH TWICE A DAY. 360 tablet 1  . Wheat Dextrin (BENEFIBER PO) 1 tablespoon daily    . diltiazem (CARDIZEM CD) 120 MG 24 hr capsule  Take 1 capsule (120 mg total) by mouth daily. 30 capsule 0  . ezetimibe (ZETIA) 10 MG tablet TAKE 1 TABLET BY MOUTH EVERY DAY (Patient taking differently: Take 10 mg by mouth every other day.) 90 tablet 3  . apixaban (ELIQUIS) 2.5 MG TABS tablet Take 1 tablet (2.5 mg total) by mouth 2 (two) times daily. (Patient not taking: Reported on 04/25/2020) 60 tablet 0  . Calcium Carb-Cholecalciferol (CALCIUM-VITAMIN D) 600-400 MG-UNIT TABS Take 1 tablet by mouth daily. (Patient not taking: Reported on 04/08/2020)     No facility-administered medications prior to visit.     Allergies:   Codeine, Cortisone, and Guaifenesin er   Social History   Socioeconomic History  . Marital status: Married    Spouse name: Not on file  . Number of children: Not on file  . Years of education: Not on file  . Highest education level: Not on file  Occupational History  . Not on file  Tobacco Use  . Smoking status: Never Smoker  . Smokeless tobacco: Never Used  Vaping Use  . Vaping Use: Never used  Substance and Sexual Activity  . Alcohol use: No  . Drug use: No  . Sexual activity: Never  Other Topics Concern  . Not on file  Social History Narrative   Lives with husband [name])    [place] child (daughter)   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[name]   Activity: walks and active in yard, grows vegetables   Diet: good water, good fruits/vegetables daily   Social Determinants of Radio broadcast assistant Strain: Not on file  Food Insecurity: Not on file  Transportation Needs: Not on file  Physical Activity: Not on file  Stress: Not on file  Social Connections: Not on file     Family History  family history includes Heart disease in her mother; Hypertension in her mother; Lung cancer in her paternal grandfather; Melanoma in her brother and father; Rheum arthritis in her father; Stroke in her mother.   ROS General: Negative; No fevers, chills, or night sweats;  HEENT: Negative; No changes in vision or hearing, sinus congestion, difficulty swallowing Pulmonary: Negative; No cough, wheezing, shortness of breath, hemoptysis Cardiovascular: Positive for MVP, hypertension, and intermittent palpitations GI: Negative; No nausea, vomiting, diarrhea, or abdominal pain GU: Negative; No dysuria, hematuria, or difficulty voiding Musculoskeletal: Negative; no myalgias, joint pain, or weakness Hematologic/Oncology: Negative; no easy bruising, bleeding Endocrine: Negative; no heat/cold intolerance; no diabetes Neuro: Negative; no changes in balance, headaches Skin: Negative; No rashes or skin lesions Psychiatric: Negative; No behavioral problems, depression Sleep: Negative; No snoring, daytime sleepiness, hypersomnolence, bruxism, restless legs, hypnogognic hallucinations, no cataplexy Other comprehensive 14 point system review is negative.   PHYSICAL EXAM:   VS:  BP (!) 142/78   Pulse 68   Ht 5' (1.524 m)   Wt 145 lb 3.2 oz (65.9 kg)   SpO2 96%   BMI 28.36 kg/m     Repeat blood pressure by me was 138/78  Wt Readings from Last 3 Encounters:  04/25/20 145 lb 3.2 oz (65.9 kg)  04/07/20 145 lb (65.8 kg)  02/01/20 146 lb 9.6 oz (66.5 kg)    General: Alert,  oriented, no distress.  Skin: normal turgor, no rashes, warm and dry HEENT: Normocephalic, atraumatic. Pupils equal round and reactive to light; sclera anicteric; extraocular muscles intact; Nose without nasal septal hypertrophy Mouth/Parynx benign; Mallinpatti scale 3 Neck: No JVD, no carotid bruits; normal carotid upstroke Lungs: clear to  ausculatation and percussion; no wheezing or rales Chest wall: without tenderness to palpitation Heart: PMI not displaced, RRR, s1 s2 normal, 1/6 systolic murmur, apical systolic click, no diastolic murmur, no rubs, gallops, thrills, or heaves Abdomen: soft, nontender; no hepatosplenomehaly, BS+; abdominal aorta nontender and not dilated by palpation. Back: no CVA tenderness Pulses 2+ Musculoskeletal: full range of motion, normal strength, no joint deformities Extremities: no clubbing cyanosis or edema, Homan's sign negative  Neurologic: grossly nonfocal; Cranial nerves grossly wnl Psychologic: Normal mood and affect  Studies/Labs Reviewed:   ECG (independently read by me): Normal sinus rhythm at 68 bpm.  Mild T wave abnormality inferolaterally.  Normal intervals.  No ectopy.  September 2021 ECG (independently read by me): Normal sinus rhythm at 73 bpm.  No ectopy.  Normal intervals.  September 2020 ECG (independently read by me): Normal sinus rhythm at 72 bpm.  Normal intervals.  No ectopy.  April 2019 ECG (independently read by me): Normal sinus rhythm at 74 bpm.  Normal intervals.  Nonspecific T changes.  October 2018 ECG (independently read by me): Normal sinus rhythm at 66 bpm.  No ectopy.  Normal intervals.  March 2018 ECG (independently read by me): NSR at 80; Normal intervals. QTc 405 msec. No ST changes.  January 2018 ECG (independently read by me): Normal sinus rhythm at 85 bpm.  Nonspecific T changes.  QTc interval 445 ms.  October 2017 ECG (independently read by me): Normal sinus rhythm at 63 bpm.  No ectopy.  Normal  intervals.  Recent Labs: BMP Latest Ref Rng & Units 04/07/2020 01/29/2020 10/01/2019  Glucose 70 - 99 mg/dL 140(H) 91 92  BUN 8 - 23 mg/dL 27(H) 22 19  Creatinine 0.44 - 1.00 mg/dL 1.58(H) 1.38(H) 1.39(H)  BUN/Creat Ratio 12 - 28 - 16 14  Sodium 135 - 145 mmol/L 141 141 143  Potassium 3.5 - 5.1 mmol/L 3.9 5.3(H) 4.7  Chloride 98 - 111 mmol/L 104 103 105  CO2 22 - 32 mmol/L 27 26 24   Calcium 8.9 - 10.3 mg/dL 9.6 9.7 9.6     Hepatic Function Latest Ref Rng & Units 01/29/2020 10/01/2019 06/06/2019  Total Protein 6.0 - 8.5 g/dL 6.4 6.2 6.2  Albumin 3.6 - 4.6 g/dL 4.4 4.4 4.2  AST 0 - 40 IU/L 26 18 22   ALT 0 - 32 IU/L 21 16 18   Alk Phosphatase 44 - 121 IU/L 93 73 92  Total Bilirubin 0.0 - 1.2 mg/dL 0.5 0.3 0.5  Bilirubin, Direct <=0.2 mg/dL - - -    CBC Latest Ref Rng & Units 04/07/2020 01/31/2020 10/01/2019  WBC 4.0 - 10.5 K/uL 9.8 7.1 7.1  Hemoglobin 12.0 - 15.0 g/dL 10.8(L) 10.2(L) 10.7(L)  Hematocrit 36.0 - 46.0 % 32.7(L) 29.0(L) 32.0(L)  Platelets 150 - 400 K/uL 380 358 365   Lab Results  Component Value Date   MCV 104.8 (H) 04/07/2020   MCV 98 (H) 01/31/2020   MCV 104 (H) 10/01/2019   Lab Results  Component Value Date   TSH 6.650 (H) 01/29/2020   No results found for: HGBA1C   BNP No results found for: BNP  ProBNP No results found for: PROBNP   Lipid Panel     Component Value Date/Time   CHOL 185 01/29/2020 0928   TRIG 89 01/29/2020 0928   HDL 63 01/29/2020 0928   CHOLHDL 2.9 01/29/2020 0928   CHOLHDL 3.4 12/09/2015 0758   VLDL 21 12/09/2015 0758   LDLCALC 106 (H) 01/29/2020 0928   LDLDIRECT 132.6  12/27/2012 1146     RADIOLOGY: DG Chest 2 View  Result Date: 04/07/2020 CLINICAL DATA:  Chest and upper back pain, abnormal EKG EXAM: CHEST - 2 VIEW COMPARISON:  12/10/2019 FINDINGS: Frontal and lateral views of the chest demonstrate an unremarkable cardiac silhouette. No acute airspace disease, effusion, or pneumothorax. Chronic midthoracic wedge compression  deformity with progressive loss of height and resulting vertebra plana. No new fractures. IMPRESSION: 1. No acute intrathoracic process. 2. Chronic midthoracic compression fracture with resulting vertebra plana. Electronically Signed   By: Randa Ngo M.D.   On: 04/07/2020 20:08   DG Thoracic Spine 2 View  Result Date: 04/07/2020 CLINICAL DATA:  Mid upper back pain EXAM: THORACIC SPINE 2 VIEWS COMPARISON:  12/10/2019 FINDINGS: Frontal and lateral views of the thoracic spine demonstrate T7 compression fracture with progressive loss of height and resulting vertebra plana. No new fractures. Stable rotatory scoliosis. Stable spondylosis at the thoracolumbar junction. Paraspinal soft tissues are unremarkable. IMPRESSION: 1. Chronic T7 compression deformity and resulting vertebra plana. 2. Stable spondylosis and scoliosis. Electronically Signed   By: Randa Ngo M.D.   On: 04/07/2020 20:09     Additional studies/ records that were reviewed today include:  I reviewed previous records by Diana Brewer. Recent laboratory was reviewed  ASSESSMENT:    1. Essential hypertension   2. Paroxysmal atrial fibrillation (HCC)   3. MVP (mitral valve prolapse)   4. Hyperlipidemia with target LDL less than 70   5. History of ulcerative proctosigmoiditis, with rectal bleeding [facility])     PLAN:  Ms Raczynski is an 86 year old female who has a history of mitral valve prolapse, hypertension, and  she had been treated with propranolol 20 mg twice a day for many years but had developed recurrent palpitations on this therapy.  She did not tolerate the substitution to Toprol-XL and has been back on propranolol  with controlled palpitations on an increased dose at 40 mg twice a day.  She had been followed by Diana Brewer until his retirement.  She has had issues with curative proctitis leading to GI blood loss intermittently.  She had developed some vague as back symptomatology leading to ER evaluation where she was found  to be in atrial fibrillation with RVR and verted to sinus rhythm with Cardizem bolus and drip.  Since, she is unaware of any recurrent heart rate irregularity and is feeling well.  Blood pressure today is mildly elevated.  I am recommending she undergo an echo Doppler study to evaluate both systolic and diastolic function.  Remotely she was diagnosed with mitral valve prolapse by Diana Brewer.  With her mild blood pressure elevation I have suggested slight titration of long-acting diltiazem from 120 mg to 180 mg every morning.  She has been on chronic propranolol 40 mg twice a day.  I am also recommending she wear a 14-day Zio patch monitor.  This will be helpful to assess PAF and make certain she does not get bradycardic with increased Cardizem.  I have recommended she add a baby aspirin.  We discussed risk benefits of anticoagulation.  She does have mild T wave abnormalities on ECG, nondiagnostic these are new compared to remote tracings.  With her LDL cholesterol of 106 I have recommended more aggressive lipid management.  Apparently she was supposed to be taking Zetia 10 mg daily but has only been taking this every other day.  As result she will take this every day and if LDL does not improve low-dose statin therapy will  be added.  I will see her in 2 months for reevaluation and prior to that visit she will undergo a comprehensive metabolic panel, magnesium level, TSH, lipid studies and CBC.   Medication Adjustments/Labs and Tests Ordered: Current medicines are reviewed at length with the patient today.  Concerns regarding medicines are outlined above.  Medication changes, Labs and Tests ordered today are listed in the Patient Instructions below. Patient Instructions  Medication Instructions:  INCREASE your cardizem to 180 mg  Take your Zetia 10 mg DAILY as prescribed START taking a baby aspirin by mouth daily *If you need a refill on your cardiac medications before your next appointment, please call  your pharmacy*   Lab Work: CMET, CBC, TSH, LIPID PROFILE, MAGNESIUM - FASTING IN 6-8 WEEKS If you have labs (blood work) drawn today and your tests are completely normal, you will receive your results only by: Marland Kitchen MyChart Message (if you have MyChart) OR . A paper copy in the mail If you have any lab test that is abnormal or we need to change your treatment, we will call you to review the results.   Testing/Procedures: Your physician has requested that you have an echocardiogram. Echocardiography is a painless test that uses sound waves to create images of your heart. It provides your doctor with information about the size and shape of your heart and how well your heart's chambers and valves are working. This procedure takes approximately one hour. There are no restrictions for this procedure. This test is performed at [address]   ZIO XT- Long Term Monitor Instructions   Your physician has requested you wear your ZIO patch monitor____14___days.   This is a single patch monitor.  [employer] supplies one patch monitor per enrollment.  Additional stickers are not available.   Please do not apply patch if you will be having a Nuclear Stress Test, Echocardiogram, Cardiac CT, MRI, or Chest Xray during the time frame you would be wearing the monitor. The patch cannot be worn during these tests.  You cannot remove and re-apply the ZIO XT patch monitor.   Your ZIO patch monitor will be sent USPS Priority mail from [facility] directly to your home address. The monitor may also be mailed to a PO BOX if home delivery is not available.   It may take 3-5 days to receive your monitor after you have been enrolled.   Once you have received you monitor, please review enclosed instructions.  Your monitor has already been registered assigning a specific monitor serial # to you.   Applying the monitor   Shave hair from upper left chest.   Hold abrader disc by orange tab.  Rub abrader in  40 strokes over left upper chest as indicated in your monitor instructions.   Clean area with 4 enclosed alcohol pads .  Use all pads to assure are is cleaned thoroughly.  Let dry.   Apply patch as indicated in monitor instructions.  Patch will be place under collarbone on left side of chest with arrow pointing upward.   Rub patch adhesive wings for 2 minutes.Remove white label marked "1".  Remove white label marked "2".  Rub patch adhesive wings for 2 additional minutes.   While looking in a mirror, press and release button in center of patch.  A small green light will flash 3-4 times .  This will be your only indicator the monitor has been turned on.     Do not shower for the first  24 hours.  You may shower after the first 24 hours.   Press button if you feel a symptom. You will hear a small click.  Record Date, Time and Symptom in the Patient Log Book.   When you are ready to remove patch, follow instructions on last 2 pages of Patient Log Book.  Stick patch monitor onto last page of Patient Log Book.   Place Patient Log Book in [place] box.  Use locking tab on box and tape box closed securely.  The [employer] and [employer] has [employer] on it.  Please place in mailbox as soon as possible.  Your physician should have your test results approximately 7 days after the monitor has been mailed back to [facility].   Call [place] at [phone] if you have questions regarding your ZIO XT patch monitor.  Call them immediately if you see an orange light blinking on your monitor.   If your monitor falls off in less than 4 days contact our Monitor department at [phone].  If your monitor becomes loose or falls off after 4 days call [employer] at [phone] for suggestions on securing your monitor.     Follow-Up: At [facility], you and your health needs are our priority.  As part of our continuing mission to provide you with exceptional heart care, we have created  designated Provider Care Teams.  These Care Teams include your primary Cardiologist (physician) and Advanced Practice Providers (APPs -  Physician Assistants and Nurse Practitioners) who all work together to provide you with the care you need, when you need it.  We recommend signing up for the patient portal called "MyChart".  Sign up information is provided on this After Visit Summary.  MyChart is used to connect with patients for Virtual Visits (Telemedicine).  Patients are able to view lab/test results, encounter notes, upcoming appointments, etc.  Non-urgent messages can be sent to your provider as well.   To learn more about what you can do with MyChart, go to [email or web address].    Your next appointment:   2 month(s)  The format for your next appointment:   In Person  Provider:   [name], MD   Other Instructions None   Signed, Diana Majestic, MD, [facility] 04/25/2020 2:17 PM    [facility] [address] [place], [place], [place]  [address] Phone: [phone]

## 2020-04-28 DIAGNOSIS — I4891 Unspecified atrial fibrillation: Secondary | ICD-10-CM

## 2020-05-07 ENCOUNTER — Other Ambulatory Visit (HOSPITAL_COMMUNITY): Payer: Medicare Other

## 2020-05-15 ENCOUNTER — Other Ambulatory Visit: Payer: Self-pay

## 2020-05-15 ENCOUNTER — Ambulatory Visit (HOSPITAL_COMMUNITY): Payer: Medicare Other | Attending: Internal Medicine

## 2020-05-15 DIAGNOSIS — I480 Paroxysmal atrial fibrillation: Secondary | ICD-10-CM | POA: Diagnosis not present

## 2020-05-15 LAB — ECHOCARDIOGRAM COMPLETE
Area-P 1/2: 4.06 cm2
S' Lateral: 3 cm

## 2020-05-22 ENCOUNTER — Telehealth: Payer: Self-pay

## 2020-05-22 DIAGNOSIS — I4891 Unspecified atrial fibrillation: Secondary | ICD-10-CM | POA: Diagnosis not present

## 2020-05-22 NOTE — Telephone Encounter (Signed)
Called pt to relay her echo results. During this call, pt states she continues to be concerned about being on a daily regimen of Aspirin 81 mg due to history of GI bleeding. This has been discussed with Dr. Claiborne Billings at 04/25/20 visit, but pt would like to explore her options further. Pt denies any obvious signs of bleeding such as gums, nosebleeds, blood in urine or stool but remains concerned. Advised pt that Dr. Claiborne Billings is not in the office, but that we would send him a message to review upon his return. Pt verbalizes understanding.

## 2020-06-19 ENCOUNTER — Other Ambulatory Visit: Payer: Self-pay | Admitting: Internal Medicine

## 2020-06-24 DIAGNOSIS — I4891 Unspecified atrial fibrillation: Secondary | ICD-10-CM | POA: Diagnosis not present

## 2020-06-24 DIAGNOSIS — I10 Essential (primary) hypertension: Secondary | ICD-10-CM | POA: Diagnosis not present

## 2020-06-24 LAB — CBC
Hematocrit: 30.1 % — ABNORMAL LOW (ref 34.0–46.6)
Hemoglobin: 10.6 g/dL — ABNORMAL LOW (ref 11.1–15.9)
MCH: 34.4 pg — ABNORMAL HIGH (ref 26.6–33.0)
MCHC: 35.2 g/dL (ref 31.5–35.7)
MCV: 98 fL — ABNORMAL HIGH (ref 79–97)
Platelets: 467 10*3/uL — ABNORMAL HIGH (ref 150–450)
RBC: 3.08 x10E6/uL — ABNORMAL LOW (ref 3.77–5.28)
RDW: 12.3 % (ref 11.7–15.4)
WBC: 9.4 10*3/uL (ref 3.4–10.8)

## 2020-06-24 LAB — COMPREHENSIVE METABOLIC PANEL
ALT: 24 IU/L (ref 0–32)
AST: 26 IU/L (ref 0–40)
Albumin/Globulin Ratio: 2.2 (ref 1.2–2.2)
Albumin: 4.6 g/dL (ref 3.6–4.6)
Alkaline Phosphatase: 104 IU/L (ref 44–121)
BUN/Creatinine Ratio: 19 (ref 12–28)
BUN: 26 mg/dL (ref 8–27)
Bilirubin Total: 0.5 mg/dL (ref 0.0–1.2)
CO2: 25 mmol/L (ref 20–29)
Calcium: 10.4 mg/dL — ABNORMAL HIGH (ref 8.7–10.3)
Chloride: 106 mmol/L (ref 96–106)
Creatinine, Ser: 1.4 mg/dL — ABNORMAL HIGH (ref 0.57–1.00)
GFR calc Af Amer: 40 mL/min/{1.73_m2} — ABNORMAL LOW (ref >59)
GFR calc non Af Amer: 35 mL/min/{1.73_m2} — ABNORMAL LOW (ref >59)
Globulin, Total: 2.1 g/dL (ref 1.5–4.5)
Glucose: 99 mg/dL (ref 65–99)
Potassium: 5.3 mmol/L — ABNORMAL HIGH (ref 3.5–5.2)
Sodium: 144 mmol/L (ref 134–144)
Total Protein: 6.7 g/dL (ref 6.0–8.5)

## 2020-06-24 LAB — LIPID PANEL
Chol/HDL Ratio: 3.1 ratio (ref 0.0–4.4)
Cholesterol, Total: 183 mg/dL (ref 100–199)
HDL: 60 mg/dL (ref >39)
LDL Chol Calc (NIH): 104 mg/dL — ABNORMAL HIGH (ref 0–99)
Triglycerides: 104 mg/dL (ref 0–149)
VLDL Cholesterol Cal: 19 mg/dL (ref 5–40)

## 2020-06-24 LAB — MAGNESIUM: Magnesium: 2 mg/dL (ref 1.6–2.3)

## 2020-06-24 LAB — TSH: TSH: 7.08 u[IU]/mL — ABNORMAL HIGH (ref 0.450–4.500)

## 2020-06-26 ENCOUNTER — Telehealth: Payer: Self-pay

## 2020-06-26 DIAGNOSIS — E875 Hyperkalemia: Secondary | ICD-10-CM

## 2020-06-26 NOTE — Telephone Encounter (Signed)
Pt called inquiring about recent lab work. Nurse informed pt that results were awaiting MD's review but noticed potassium was slightly elevated.   MD reviewed potassium level and recommended to decrease food high in potassium and repeat BMP work next week. Pt verbalized understanding.

## 2020-07-01 DIAGNOSIS — E875 Hyperkalemia: Secondary | ICD-10-CM | POA: Diagnosis not present

## 2020-07-01 LAB — BASIC METABOLIC PANEL
BUN/Creatinine Ratio: 16 (ref 12–28)
BUN: 23 mg/dL (ref 8–27)
CO2: 24 mmol/L (ref 20–29)
Calcium: 10.2 mg/dL (ref 8.7–10.3)
Chloride: 102 mmol/L (ref 96–106)
Creatinine, Ser: 1.41 mg/dL — ABNORMAL HIGH (ref 0.57–1.00)
GFR calc Af Amer: 39 mL/min/{1.73_m2} — ABNORMAL LOW (ref >59)
GFR calc non Af Amer: 34 mL/min/{1.73_m2} — ABNORMAL LOW (ref >59)
Glucose: 87 mg/dL (ref 65–99)
Potassium: 5 mmol/L (ref 3.5–5.2)
Sodium: 139 mmol/L (ref 134–144)

## 2020-07-03 ENCOUNTER — Ambulatory Visit: Payer: Medicare Other | Admitting: Cardiovascular Disease

## 2020-07-03 ENCOUNTER — Other Ambulatory Visit: Payer: Self-pay

## 2020-07-03 VITALS — BP 128/64 | HR 77 | Ht 62.0 in | Wt 147.0 lb

## 2020-07-03 DIAGNOSIS — I480 Paroxysmal atrial fibrillation: Secondary | ICD-10-CM

## 2020-07-03 DIAGNOSIS — E785 Hyperlipidemia, unspecified: Secondary | ICD-10-CM

## 2020-07-03 DIAGNOSIS — I4891 Unspecified atrial fibrillation: Secondary | ICD-10-CM

## 2020-07-03 DIAGNOSIS — N1832 Chronic kidney disease, stage 3b: Secondary | ICD-10-CM | POA: Diagnosis not present

## 2020-07-03 DIAGNOSIS — R002 Palpitations: Secondary | ICD-10-CM

## 2020-07-03 DIAGNOSIS — I10 Essential (primary) hypertension: Secondary | ICD-10-CM | POA: Diagnosis not present

## 2020-07-03 DIAGNOSIS — K51311 Ulcerative (chronic) rectosigmoiditis with rectal bleeding: Secondary | ICD-10-CM

## 2020-07-03 NOTE — Patient Instructions (Signed)
Medication Instructions:  Take an extra dose propranolol if there is increase in palpitations  *If you need a refill on your cardiac medications before your next appointment, please call your pharmacy*   Lab Work: None Ordered   Testing/Procedures: None Ordered   Follow-Up: At [facility], you and your health needs are our priority.  As part of our continuing mission to provide you with exceptional heart care, we have created designated Provider Care Teams.  These Care Teams include your primary Cardiologist (physician) and Advanced Practice Providers (APPs -  Physician Assistants and Nurse Practitioners) who all work together to provide you with the care you need, when you need it.  We recommend signing up for the patient portal called "MyChart".  Sign up information is provided on this After Visit Summary.  MyChart is used to connect with patients for Virtual Visits (Telemedicine).  Patients are able to view lab/test results, encounter notes, upcoming appointments, etc.  Non-urgent messages can be sent to your provider as well.   To learn more about what you can do with MyChart, go to [email or web address].    Your next appointment:   6 month(s)  The format for your next appointment:   In Person  Provider:   You may see [name], MD or one of the following Advanced Practice Providers on your designated Care Team:    Almyra Deforest, PA-C  Fabian Sharp, PA-C or   Roby Lofts, [place]

## 2020-07-03 NOTE — Progress Notes (Signed)
Cardiology Office Note    Date:  07/05/2020   ID:  Diana Brewer, DOB Aug 12, 1933, MRN [id number]  PCP:  Diana Bush, MD  Cardiologist:  Diana Majestic, MD   F/U evaluation  History of Present Illness:  Diana Brewer is a 87 y.o. female who has been followed by Dr. Mare Brewer almost 30 years.  I have cared for several of her family members. She established cardiology care with me in October 2017.  I last saw her in December 2021.  She presents for 24-monthfollow-up evaluation.  DianaBrewer has a long-standing history of hypertension, as well as mitral valve prolapse.  She has a history of hyperlipidemia, as well as chronic inflammatory proctitis.  She has been maintained on propranolol 20 mg twice a day for palpitations and at times has to take extra pill for breakthrough ectopy.  She has complained that her weight has been gradually increasing.  Dr. BMare Ferrarihad previously checked a TSH.  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  She denies any change in development of shortness of breath.    When I saw her, since she was taken propranolol 20 mg twice a day, but at times needed additional doses due to recurrent palpitations  I suggested substituting this for Toprol-XL 25 mg daily with plans to titrate as needed.  However, on the metoprolol preparation she felt that she developed some recurrent migraine headaches, which she had not experienced while she was taken propranolol.  She was switched back to propanolol 40 mg bid which has beneficial with reference to her migraine headaches on this increased beta blocker dose.  Her palpitations also have improved. When  I initially evaluated her, she had a soft carotid bruit on physical exam.  She underwent carotid duplex imaging on 07/21/2016.  This revealed mild heterogenous plaque bilaterally with narrowings in the 1-39% range.  She  had normal subclavian and vertebral arteries bilaterally.  Laboratory in August 2017 had shown an LDL cholesterol of 110 and  in February 2017 LDL was 138.    When I saw her in March 2018, I reviewed recent subclinical atherosclerosis date on a systematic patients without cardiac risk factors.  His cast development of sclerosis which was found to develop even at LDLs, beginning at 60.  She was hasn't to try a statin.  I instituted Zetia 10 mg.  She has tolerated this well.  She underwent recent laboratory which now showed improvement in her lipid studies such that total cholesterol was 143, triglycerides 96, HDL 52, LDL 72.  Creatinine was 1.24 which had improved from 1.37.    I  saw her in April 2019 at which time she denied any chest pain or palpitations.  She continued to take propranolol 40 mg twice a day and was taking HCTZ 12.5 mg every other day.  Apparently, she has been cutting her Zetia 10 mg in half.  Laboratory one week previously has shown a creatinine of 1.25 which was slightly improved from 2 weeks ago when it was 1.41.  At that time, her HCTZ was reduced to every other day. Lipid studies have shown her LDL cholesterol increased to 97 when 7 months ago it was 72.   When I saw her in September 2020 she was no longer taking hydrochlorothiazide.  Her palpitations remained controlled on propranolol 40 mg twice a day.  She continues to  take Zetia 10 mg for hyperlipidemia for her subclinical atherosclerosis.  She is not exercising as much as he had in the past due to the Covid pandemic.  She denied chest pain, PND, orthopnea, presyncope or syncope.   I saw her in September 2021.  Over the prior year she developed Covid in November/December 2020.  She also had a T7 thoracic fracture several months ago.  She  had decreased energy since her Covid infection.  She states her blood pressure at home typically runs around 115 to less than 263 systolically.  She denies any chest pain.  She had recent  laboratory which showed her LDL cholesterol at 106, creatinine 1.38, potassium 5.3, TSH 6.6.  We discussed potassium avoidance and suggested follow-up potassium level in 1 week.  She had remained stable and has seen Dr. Danise Brewer for primary care.  She had developed some vague back discomfort over the weekend with ultimately improved plan to see her primary MD but because of a 1 day history of mild temperature elevation she was advised to go to urgent care.  Apparently she was in atrial fibrillation for which she was asymptomatic.  She was transferred to [facility] ER.  She was given a Cardizem bolus and drip and converted to sinus rhythm and as result was not admitted.  There was discussion concerning initiation of anticoagulation but with the patient's history of ulcerative proctitis with GI bleeds in the past she preferred not to initiate elation.  She was given a prescription for Cardizem slow release 120 mg for rate control.  I saw her in follow-up of her emergency room evaluation on April 25, 2020.  At that time she denied any awareness of recurrent  irregular heart rhythm with her pulse typically in the 60s to 70s.  She denied shortness of breath.  She denied any chest pressure with activity.  During that evaluation her blood pressure was elevated and I recommended further titration of diltiazem CD from 120 mg up to 180 mg every morning.  I recommended she wear a 14-day Zio patch.  Her 2-week Zio patch showed predominant sinus rhythm with an average heart rate of 77 bpm.  She had 1 run of nonsustained ventricular tachycardia of 4 beats with a maximum rate of 169.  There were 21 episodes of SVT with the fastest interval lasting 13 beats up to a maximum of 176 and the longest interval lasting 28 seconds with an average rate at 110 bpm.  There were rare isolated PACs, atrial couplets and triplets of less than 1%.  There were very rare isolated PVCs.  She did not have any recurrent atrial fibrillation.  She  underwent an echo Doppler study on May 15, 2020 which showed an EF of 60 to 65%, mild LVH and grade 2 diastolic dysfunction.  His mild to moderate increased right ventricular systolic pressure at 46 mm.  There is mild to moderate mitral regurgitation and mild aortic sclerosis.  Atrial sizes were normal.    Presently, she denies any awareness of recurrent palpitations.  She continues to be on diltiazem 180 mg daily and propranolol 40 mg twice a day.  She is on Zetia for hyperlipidemia.  For GI issues she is on Azulfidine in addition to Rowasa.  She continues to experience some intermittent back pain as result of a T7 fracture.  She denies any chest pain PND orthopnea.  She presents for reevaluation.  Past Medical History:  Diagnosis Date  . Arthritis 2016   R wrist  predominantly radius scaphoid and midcarpal [name]  . History of migraine remote  . History of shingles   . HLD (hyperlipidemia)   . Hypertension   . Macular degeneration    [name]  . MVP (mitral valve prolapse)   . Palpitations   . Postherpetic neuralgia 12/27/2012  . Ulcerative colitis ([place])    left sided - proctosigmoiditis ([name])    Past Surgical History:  Procedure Laterality Date  . BREAST BIOPSY  1975  . COLONOSCOPY  2011   [facility]  . TRACHEOSTOMY  1942   due to Strep Throat (?epiglottitis)    Current Medications: Outpatient Medications Prior to Visit  Medication Sig Dispense Refill  . acetaminophen (TYLENOL) 500 MG tablet Take 1 tablet (500 mg total) by mouth in the morning, at noon, and at bedtime.    Marland Kitchen aspirin EC 81 MG tablet Take 1 tablet (81 mg total) by mouth daily. Swallow whole. 30 tablet 11  . diltiazem (CARDIZEM CD) 180 MG 24 hr capsule Take 1 capsule (180 mg total) by mouth daily. 90 capsule 1  . ezetimibe (ZETIA) 10 MG tablet Take 1 tablet (10 mg total) by mouth daily. 90 tablet 3  . folic acid (FOLVITE) 1 MG tablet Take 1 tablet (1 mg total) by mouth daily.    . mesalamine (ROWASA) 4 g  enema PLACE 60 MLS (4 G TOTAL) RECTALLY AT BEDTIME. 1800 mL 2  . Multiple Vitamins-Minerals (CENTRUM SILVER) tablet Take 1 tablet by mouth daily.    . propranolol (INDERAL) 40 MG tablet TAKE 1 TABLET BY MOUTH TWICE A DAY 180 tablet 3  . sulfaSALAzine (AZULFIDINE) 500 MG tablet TAKE 2 TABLETS BY MOUTH TWICE A DAY. 360 tablet 1  . Wheat Dextrin (BENEFIBER PO) 1 tablespoon daily     No facility-administered medications prior to visit.     Allergies:   Codeine, Cortisone, and Guaifenesin er   Social History   Socioeconomic History  . Marital status: Married    Spouse name: Not on file  . Number of children: Not on file  . Years of education: Not on file  . Highest education level: Not on file  Occupational History  . Not on file  Tobacco Use  . Smoking status: Never Smoker  . Smokeless tobacco: Never Used  Vaping Use  . Vaping Use: Never used  Substance and Sexual Activity  . Alcohol use: No  . Drug use: No  . Sexual activity: Never  Other Topics Concern  . Not on file  Social History Narrative   Lives with husband [name])   [place] child (daughter)   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[name]   Activity: walks and active in yard, grows vegetables   Diet: good water, good fruits/vegetables daily   Social Determinants of Radio broadcast assistant Strain: Not on file  Food Insecurity: Not on file  Transportation Needs: Not on file  Physical Activity: Not on file  Stress: Not on file  Social Connections: Not on file     Family History  family history includes Heart disease in her mother; Hypertension in her mother; Lung cancer in her paternal grandfather; Melanoma in her brother and father; Rheum arthritis in her father; Stroke in her mother.   ROS General: Negative; No fevers, chills, or night sweats;  HEENT: Negative; No changes in vision or hearing, sinus congestion, difficulty swallowing Pulmonary: Negative; No cough, wheezing, shortness of breath,  hemoptysis Cardiovascular: Positive for MVP, hypertension, and intermittent palpitations GI: Negative;  No nausea, vomiting, diarrhea, or abdominal pain GU: Negative; No dysuria, hematuria, or difficulty voiding Musculoskeletal: T7 back discomfort from prior fracture Hematologic/Oncology: Negative; no easy bruising, bleeding Endocrine: Negative; no heat/cold intolerance; no diabetes Neuro: Negative; no changes in balance, headaches Skin: Negative; No rashes or skin lesions Psychiatric: Negative; No behavioral problems, depression Sleep: Negative; No snoring, daytime sleepiness, hypersomnolence, bruxism, restless legs, hypnogognic hallucinations, no cataplexy Other comprehensive 14 point system review is negative.   PHYSICAL EXAM:   VS:  BP 128/64   Pulse 77   Ht 5' 2"  (1.575 m)   Wt 147 lb (66.7 kg)   SpO2 96%   BMI 26.89 kg/m     Repeat blood pressure by me 128/66  Wt Readings from Last 3 Encounters:  07/03/20 147 lb (66.7 kg)  04/25/20 145 lb 3.2 oz (65.9 kg)  04/07/20 145 lb (65.8 kg)     General: Alert, oriented, no distress.  Skin: normal turgor, no rashes, warm and dry HEENT: Normocephalic, atraumatic. Pupils equal round and reactive to light; sclera anicteric; extraocular muscles intact;  Nose without nasal septal hypertrophy Mouth/Parynx benign; Mallinpatti scale 3 Neck: No JVD, no carotid bruits; normal carotid upstroke Lungs: clear to ausculatation and percussion; no wheezing or rales Chest wall: without tenderness to palpitation Heart: PMI not displaced, RRR, s1 s2 normal, 1/6 systolic murmur, no diastolic murmur, no rubs, gallops, thrills, or heaves Abdomen: soft, nontender; no hepatosplenomehaly, BS+; abdominal aorta nontender and not dilated by palpation. Back: no CVA tenderness Pulses 2+ Musculoskeletal: full range of motion, normal strength, no joint deformities Extremities: no clubbing cyanosis or edema, Homan's sign negative  Neurologic: grossly nonfocal;  Cranial nerves grossly wnl Psychologic: Normal mood and affect   Studies/Labs Reviewed:   ECG (independently read by me): NSR at 77; nonspecific T wave abnormality, no ectopy  December 2021 ECG (independently read by me): Normal sinus rhythm at 68 bpm.  Mild T wave abnormality inferolaterally.  Normal intervals.  No ectopy.  September 2021 ECG (independently read by me): Normal sinus rhythm at 73 bpm.  No ectopy.  Normal intervals.  September 2020 ECG (independently read by me): Normal sinus rhythm at 72 bpm.  Normal intervals.  No ectopy.  April 2019 ECG (independently read by me): Normal sinus rhythm at 74 bpm.  Normal intervals.  Nonspecific T changes.  October 2018 ECG (independently read by me): Normal sinus rhythm at 66 bpm.  No ectopy.  Normal intervals.  March 2018 ECG (independently read by me): NSR at 80; Normal intervals. QTc 405 msec. No ST changes.  January 2018 ECG (independently read by me): Normal sinus rhythm at 85 bpm.  Nonspecific T changes.  QTc interval 445 ms.  October 2017 ECG (independently read by me): Normal sinus rhythm at 63 bpm.  No ectopy.  Normal intervals.  Recent Labs: BMP Latest Ref Rng & Units 07/01/2020 06/24/2020 04/07/2020  Glucose 65 - 99 mg/dL 87 99 140(H)  BUN 8 - 27 mg/dL 23 26 27(H)  Creatinine 0.57 - 1.00 mg/dL 1.41(H) 1.40(H) 1.58(H)  BUN/Creat Ratio 12 - 28 16 19  -  Sodium 134 - 144 mmol/L 139 144 141  Potassium 3.5 - 5.2 mmol/L 5.0 5.3(H) 3.9  Chloride 96 - 106 mmol/L 102 106 104  CO2 20 - 29 mmol/L 24 25 27   Calcium 8.7 - 10.3 mg/dL 10.2 10.4(H) 9.6     Hepatic Function Latest Ref Rng & Units 06/24/2020 01/29/2020 10/01/2019  Total Protein 6.0 - 8.5 g/dL 6.7 6.4 6.2  Albumin 3.6 - 4.6 g/dL 4.6 4.4 4.4  AST 0 - 40 IU/L 26 26 18   ALT 0 - 32 IU/L 24 21 16   Alk Phosphatase 44 - 121 IU/L 104 93 73  Total Bilirubin 0.0 - 1.2 mg/dL 0.5 0.5 0.3  Bilirubin, Direct <=0.2 mg/dL - - -    CBC Latest Ref Rng & Units 06/24/2020 04/07/2020  01/31/2020  WBC 3.4 - 10.8 x10E3/uL 9.4 9.8 7.1  Hemoglobin 11.1 - 15.9 g/dL 10.6(L) 10.8(L) 10.2(L)  Hematocrit 34.0 - 46.6 % 30.1(L) 32.7(L) 29.0(L)  Platelets 150 - 450 x10E3/uL 467(H) 380 358   Lab Results  Component Value Date   MCV 98 (H) 06/24/2020   MCV 104.8 (H) 04/07/2020   MCV 98 (H) 01/31/2020   Lab Results  Component Value Date   TSH 7.080 (H) 06/24/2020   No results found for: HGBA1C   BNP No results found for: BNP  ProBNP No results found for: PROBNP   Lipid Panel     Component Value Date/Time   CHOL 183 06/24/2020 0914   TRIG 104 06/24/2020 0914   HDL 60 06/24/2020 0914   CHOLHDL 3.1 06/24/2020 0914   CHOLHDL 3.4 12/09/2015 0758   VLDL 21 12/09/2015 0758   LDLCALC 104 (H) 06/24/2020 0914   LDLDIRECT 132.6 12/27/2012 1146     RADIOLOGY: No results found.   Additional studies/ records that were reviewed today include:  I reviewed previous records by Dr. Mare Brewer. Recent laboratory was reviewed ER evaluation was reviewed I reviewed her echo Doppler study and Zio patch monitor  ASSESSMENT:    1. Paroxysmal atrial fibrillation (HCC)   2. Essential hypertension   3. Hyperlipidemia with target LDL less than 70   4. Stage 3b chronic kidney disease ([place])   5. History of ulcerative proctosigmoiditis, with rectal bleeding [facility])     PLAN:  Ms Shepherd is an 87 year old female who has a history of mitral valve prolapse, hypertension, and  had been treated with propranolol 20 mg twice a day for many years but had developed recurrent palpitations on this therapy.  She did not tolerate the substitution to Toprol-XL and has been back on propranolol  with controlled palpitations on an increased dose at 40 mg twice a day.  She had been followed by Dr. Mare Brewer until his retirement.  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  Shehas been on Azulfidine and Rowasa.  During her December 2021 emergency room  evaluation she was found to be in atrial fibrillation with RVR and converted to sinus rhythm with Cardizem bolus and drip.  When I saw her in follow-up, I further titrated Cardizem CD to 180 mg and recommended she undergo a 2D echo Doppler study and wear a 14-day Zio patch.  I reviewed her echo assessment with her in detail which shows an EF of 60 to 65%, mild LVH and grade 2 diastolic dysfunction.  She had  increased RVSP at 46 mm.,  Mild to moderate mitral regurgitation and mild aortic sclerosis.  I reviewed her Zio patch monitor with her in detail today.  This showed predominant sinus rhythm but she had a 4 beat episode of nonsustained VT at a rate of 169 bpm.  There also were 21 brief episodes of SVT with the fastest interval lasting 13 beats at a rate of 176 and the longest interval lasting 28 seconds with an average rate of 110.  She believes she is feeling well on her increased  Cardizem dose.  Her blood pressure today is stable.  I have recommended that if she develops any recurrent palpitations diltiazem will be increased to 2040 mg daily.  She is now back on Zetia 10 mg daily.  Laboratories shown total cholesterol 183 LDL 104 HDL 60 and triglycerides 104.  She has stage III CKD with creatinine 1.41 and GFR 34 mL/min.  I will see her in 6 months for reevaluation or sooner as needed.   Medication Adjustments/Labs and Tests Ordered: Current medicines are reviewed at length with the patient today.  Concerns regarding medicines are outlined above.  Medication changes, Labs and Tests ordered today are listed in the Patient Instructions below. Patient Instructions  Medication Instructions:  Take an extra dose propranolol if there is increase in palpitations  *If you need a refill on your cardiac medications before your next appointment, please call your pharmacy*   Lab Work: None Ordered   Testing/Procedures: None Ordered   Follow-Up: At [facility], you and your health needs are our  priority.  As part of our continuing mission to provide you with exceptional heart care, we have created designated Provider Care Teams.  These Care Teams include your primary Cardiologist (physician) and Advanced Practice Providers (APPs -  Physician Assistants and Nurse Practitioners) who all work together to provide you with the care you need, when you need it.  We recommend signing up for the patient portal called "MyChart".  Sign up information is provided on this After Visit Summary.  MyChart is used to connect with patients for Virtual Visits (Telemedicine).  Patients are able to view lab/test results, encounter notes, upcoming appointments, etc.  Non-urgent messages can be sent to your provider as well.   To learn more about what you can do with MyChart, go to [email or web address].    Your next appointment:   6 month(s)  The format for your next appointment:   In Person  Provider:   You may see Diana Majestic, MD or one of the following Advanced Practice Providers on your designated Care Team:    Almyra Deforest, PA-C  Fabian Sharp, PA-C or   Roby Lofts, PA-C       Signed, Diana Majestic, MD, [facility] 07/05/2020 3:45 PM    [place] [address] [place], [place], [place]  [address] Phone: [phone]

## 2020-07-04 ENCOUNTER — Telehealth: Payer: Self-pay | Admitting: Cardiovascular Disease

## 2020-07-04 NOTE — Telephone Encounter (Signed)
Patient calling for lab results.

## 2020-07-04 NOTE — Telephone Encounter (Signed)
Returned the call to the patient. She has been made aware that the results have not been read yet and as soon as they were we would call her back. She has verbalized her understanding.

## 2020-07-05 ENCOUNTER — Encounter: Payer: Self-pay | Admitting: Cardiovascular Disease

## 2020-07-19 ENCOUNTER — Other Ambulatory Visit: Payer: Self-pay | Admitting: Internal Medicine

## 2020-07-31 ENCOUNTER — Ambulatory Visit (INDEPENDENT_AMBULATORY_CARE_PROVIDER_SITE_OTHER)
Admission: RE | Admit: 2020-07-31 | Discharge: 2020-07-31 | Disposition: A | Discharge status: Home / Self Care | Payer: Medicare Other | Source: Ambulatory Visit | Attending: Family Medicine | Admitting: Family Medicine

## 2020-07-31 ENCOUNTER — Encounter: Payer: Self-pay | Admitting: Family Medicine

## 2020-07-31 ENCOUNTER — Other Ambulatory Visit: Payer: Self-pay

## 2020-07-31 ENCOUNTER — Ambulatory Visit (INDEPENDENT_AMBULATORY_CARE_PROVIDER_SITE_OTHER): Payer: Medicare Other | Admitting: Family Medicine

## 2020-07-31 VITALS — BP 140/76 | HR 71 | Temp 98.2°F | Ht 62.0 in | Wt 147.5 lb

## 2020-07-31 DIAGNOSIS — S22010S Wedge compression fracture of first thoracic vertebra, sequela: Secondary | ICD-10-CM

## 2020-07-31 DIAGNOSIS — S22000A Wedge compression fracture of unspecified thoracic vertebra, initial encounter for closed fracture: Secondary | ICD-10-CM | POA: Diagnosis not present

## 2020-07-31 DIAGNOSIS — M546 Pain in thoracic spine: Secondary | ICD-10-CM

## 2020-07-31 DIAGNOSIS — G8929 Other chronic pain: Secondary | ICD-10-CM

## 2020-07-31 DIAGNOSIS — M549 Dorsalgia, unspecified: Secondary | ICD-10-CM

## 2020-07-31 DIAGNOSIS — Z87310 Personal history of (healed) osteoporosis fracture: Secondary | ICD-10-CM

## 2020-07-31 DIAGNOSIS — S22050A Wedge compression fracture of T5-T6 vertebra, initial encounter for closed fracture: Secondary | ICD-10-CM | POA: Diagnosis not present

## 2020-07-31 DIAGNOSIS — S22070A Wedge compression fracture of T9-T10 vertebra, initial encounter for closed fracture: Secondary | ICD-10-CM | POA: Diagnosis not present

## 2020-07-31 DIAGNOSIS — M545 Low back pain, unspecified: Secondary | ICD-10-CM | POA: Diagnosis not present

## 2020-07-31 MED ORDER — TRAMADOL HCL 50 MG PO TABS: 50.0000 mg | ORAL_TABLET | Freq: Three times a day (TID) | ORAL | 0 refills | Status: DC | PRN

## 2020-07-31 NOTE — Progress Notes (Signed)
Trevaris Pennella T. Alante Weimann, MD, [place]  [facility] [place] [facility] [place], [address]  Phone: [phone]   FAX: [phone]  Diana Brewer - 87 y.o. female   MRN [id number]   Date of Birth: [birth year]  Date: 07/31/2020   PCP: [name], MD   Referral: [name], MD  Chief Complaint  Patient presents with   Back Pain    Fx Vertebrae July 2021-Low back pain feels like she is breaking into.   Axillary Pain    Left Arm    This visit occurred during the SARS-CoV-2 public health emergency.  Safety protocols were in place, including screening questions prior to the visit, additional usage of staff PPE, and extensive cleaning of exam room while observing appropriate contact time as indicated for disinfecting solutions.   Subjective:   Diana Brewer is a 87 y.o. very pleasant female patient with Body mass index is 26.98 kg/m. who presents with the following:  Three nights ago Severe pain on the left side and she cannot rest at all Pain of the left side is hurting her a lot. Then walking across the floow and had some very severe pain in the lumbar spine.Up and down the L side.  This was a lightninglike strike of pain.  She denies any numbness, tingling, radicular pain.  She denies any new saddle anesthesia, or new change in bowel or bladder habits.  She does have a history of having multiple compression fractures in the past.  In the most recent notes, it does note that she had a compression fracture at T7 in the middle of the summer in 2021.  Lightning like strike of pain.  Review of Systems is noted in the HPI, as appropriate   Objective:   BP 140/76    Pulse 71    Temp 98.2 F (36.8 C) (Temporal)    Ht 5' 2"  (1.575 m)    Wt 147 lb 8 oz (66.9 kg)    SpO2 99%    BMI 26.98 kg/m   GEN: No acute distress; alert,appropriate. PULM: Breathing comfortably in no respiratory  distress PSYCH: Normally interactive.    She has no pain with rib compression. Her shoulder movement is full. Nontender at the scapula. Nontender with posterior pelvis compression and sacral compression.  She is globally muscle spasm throughout the entirety of the lumbar spine, but she has no specific spinous process tenderness. In the lower aspect of the thoracic spine the patient does have some acute tenderness to palpation along spinous process.  She also has diffuse paraspinous muscular rigidity.  Globally on the lower extremity she is entirely neurovascularly intact.  Radiology: DG Thoracic Spine W/Swimmers  Result Date: 07/31/2020 CLINICAL DATA:  Acute on chronic severe pain EXAM: THORACIC SPINE - 3 VIEWS COMPARISON:  04/07/2020 FINDINGS: Remote T5, T7, and T11 compression fractures with advanced height loss at T5 and T7 where there is also sclerosis. T9 inferior endplate fracture that is new from November 2021 with endplate depression only seen to a mild degree. No evidence of bone lesion or erosion. Thoracolumbar scoliosis and generalized degenerative disc space narrowing with ridging. IMPRESSION: 1. T9 inferior endplate fracture which is new from November 2021 and age-indeterminate. 2. Remote T5, T7, and T11 compression fractures. Electronically Signed   By: Monte Fantasia M.D.   On: 07/31/2020 11:07   DG Lumbar Spine Complete  Result Date: 07/31/2020 CLINICAL DATA:  Acute on  chronic severe pain EXAM: LUMBAR SPINE - COMPLETE 4+ VIEW COMPARISON:  None. FINDINGS: T12 wedging as described on dedicated thoracic study. No acute lumbar spine fracture is seen. Diffuse disc narrowing and endplate spurring with scoliosis. Multilevel degenerative facet spurring. Prominent osteopenia. IMPRESSION: 1. No acute finding in the lumbar spine. 2. Radiographic sensitivity is definitely diminished by osteopenia and advanced degenerative disease with scoliosis. Electronically Signed   By: Monte Fantasia  M.D.   On: 07/31/2020 11:05     Assessment and Plan:     ICD-10-CM   1. Thoracic compression fracture, closed, initial encounter ([place])  S22.000A   2. Closed wedge compression fracture of T1 vertebra, sequela  S22.010S DG Lumbar Spine Complete    DG Thoracic Spine W/Swimmers  3. Chronic back pain greater than 3 months duration  M54.9 DG Lumbar Spine Complete   G89.29 DG Thoracic Spine W/Swimmers  4. Chronic bilateral thoracic back pain  M54.6 DG Lumbar Spine Complete   G89.29 DG Thoracic Spine W/Swimmers  5. H/O healed fragility fracture  Z87.310    Radiology and I agree that she does have a new T9 compression fracture in comparison to prior injury.  Clinically this corresponds to her maximal area of pain.  Usually, this will heal fine without any additional intervention.  I am getting give the patient some tramadol, but I asked her to call me if her symptoms do not improve at all with tramadol.  In this case, a higher level of opioids would be appropriate for pain management.  She has had multiple fragility fractures in the spine.  I would recommend that the patient go on a bisphosphonate or other alternative medications to decrease risk of additional fragility fracture.  Cc: Dr. Danise Mina   Meds ordered this encounter  Medications   traMADol (ULTRAM) 50 MG tablet    Sig: Take 1 tablet (50 mg total) by mouth every 8 (eight) hours as needed for moderate pain.    Dispense:  20 tablet    Refill:  0    Orders Placed This Encounter  Procedures   DG Lumbar Spine Complete   DG Thoracic Spine W/Swimmers    Follow-up: Return in about 6 weeks (around 09/11/2020) for Dr. Darnell Level follow-up.  Signed,  Maud Deed. Shahida Schnackenberg, MD   Outpatient Encounter Medications as of 07/31/2020  Medication Sig   acetaminophen (TYLENOL) 500 MG tablet Take 1 tablet (500 mg total) by mouth in the morning, at noon, and at bedtime.   aspirin EC 81 MG tablet Take 1 tablet (81 mg total) by mouth daily. Swallow  whole.   diltiazem (CARDIZEM CD) 180 MG 24 hr capsule Take 1 capsule (180 mg total) by mouth daily.   ezetimibe (ZETIA) 10 MG tablet Take 1 tablet (10 mg total) by mouth daily.   folic acid (FOLVITE) 1 MG tablet Take 1 tablet (1 mg total) by mouth daily.   Multiple Vitamins-Minerals (CENTRUM SILVER) tablet Take 1 tablet by mouth daily.   propranolol (INDERAL) 40 MG tablet TAKE 1 TABLET BY MOUTH TWICE A DAY   sulfaSALAzine (AZULFIDINE) 500 MG tablet TAKE 2 TABLETS BY MOUTH TWICE A DAY   traMADol (ULTRAM) 50 MG tablet Take 1 tablet (50 mg total) by mouth every 8 (eight) hours as needed for moderate pain.   Wheat Dextrin (BENEFIBER PO) 1 tablespoon daily   mesalamine (ROWASA) 4 g enema PLACE 60 MLS (4 G TOTAL) RECTALLY AT BEDTIME.   No facility-administered encounter medications on file as of 07/31/2020.

## 2020-08-01 ENCOUNTER — Telehealth: Payer: Self-pay | Admitting: Family Medicine

## 2020-08-01 NOTE — Telephone Encounter (Signed)
Pt called in has questions about her apt and wanted to know about speaking to lisa

## 2020-08-01 NOTE — Telephone Encounter (Signed)
Telephone busy X 2 will have to try back again later.

## 2020-08-01 NOTE — Telephone Encounter (Signed)
Tried to call patient again and her telephone was still busy.

## 2020-08-01 NOTE — Telephone Encounter (Signed)
Attempted to contact pt.  Line busy.  Will try again later.

## 2020-08-04 NOTE — Telephone Encounter (Signed)
Spoke to patient and was advised that she saw Dr. Lorelei Pont about her back and was really surprised what was found on her x-rays. Patient stated that she had an x-ray of her back in November and was not informed that she had all the problems in her back that are apparently showing up now. Patient stated that she has decided to take Tylenol 500 mg for her back pain. Patient would like for Dr. Danise Mina to review the notes and x-rays from her visit with Dr. Lorelei Pont and give his opinion. Patient stated that she does not understand why she would have another fracture because she has done nothing to cause an injury to her back. Patient stated that is surprised that she now has advanced degenerative disease of her back. Patient is wondering if she should be referred to an orthopedist since she continues to have fractures in her back.

## 2020-08-07 NOTE — Telephone Encounter (Signed)
Spoke with patient regarding her concerns.  New T9 compression fracture, definite old fracture at T7, possible old fractures at T5 and T11.  She is taking tylenol 561m tid with poor pain control. Has not tried tramadol.  rec start calcium and vit D supplements - she has not been taking. Worried about calcium supplements and kidney stones - will focus on calcium in diet goal 12041mday.  Discussed starting OP medication however we're limited with CKD and GI issues - she declines at this time. She would however be willing to have usKorearice out prolia for her.   Can we price out prolia to get an estimate of what this would cost her?

## 2020-08-07 NOTE — Telephone Encounter (Signed)
She will keep f/u with me 09/2020, let us know sooner if pain remains uncontrolled with above.

## 2020-08-12 NOTE — Telephone Encounter (Signed)
Benefit verification submitted today. Pending decision.

## 2020-08-15 ENCOUNTER — Telehealth: Payer: Self-pay

## 2020-08-15 MED ORDER — CALCITONIN (SALMON) 200 UNIT/ACT NA SOLN: 1.0000 | Freq: Every day | NASAL | 1 refills | Status: DC

## 2020-08-15 NOTE — Telephone Encounter (Signed)
Pt called and she is still having sharp constant pain in her back; pain level now is 7-8.Tylenol 500 mg taking 2 tabs tid is not helping the pain at all. Pt has more pain when moving or trying to walk. Now pt cannot reach around to clean herself after BM. Pt is afraid to take the tramadol; pt said DR G has recommended taking 1/2 tab of tramadol but pt is too afraid to do that. Paperwork with tramadol advised that the med could kill you. Pt said cardiology has previously advised pt not to take ibuprofen. Pt wants to know what else can be done for her pain. [facility]. Pt wants note to go to DR G as PCP. Pt request cb asap.

## 2020-08-15 NOTE — Telephone Encounter (Signed)
Pt called back and notified as instructed and pt voiced understanding. Pt wants to know since she is living from the bed to the chair to the bathroom Does Dr G have any suggestion about possibly a back brace or some other way to help pts pain. Pt request cb. Pt wants to verify that calcitonin is OK to take with all her other meds.

## 2020-08-15 NOTE — Telephone Encounter (Signed)
Attempted several times to contact pt.  Line busy.  Will try again later.

## 2020-08-15 NOTE — Telephone Encounter (Signed)
We can try nasal calcitonin an osteoporosis medicine which may help back pain, this is temporary measure for 1-2 months.  She should try tramadol 1/2 tablet as well which may help. I do recommend she consider prolia bone strengthening medication - we are in the process of pricing out through her insurance.

## 2020-08-15 NOTE — Telephone Encounter (Signed)
Ok to take with other medicines. Don't recommend bracing at this time.  Recommend she try tramadol , could start at 1/4 tablet first dose if remains concerned.

## 2020-08-18 NOTE — Telephone Encounter (Signed)
Attempted several times to contact pt.  Line busy.  Will try again later.

## 2020-08-19 NOTE — Telephone Encounter (Signed)
Attempted several times to contact pt.  Line busy.  Will try again later.

## 2020-08-20 NOTE — Telephone Encounter (Addendum)
Attempted several times to contact pt.  Line busy.  Mailing a letter.

## 2020-09-07 ENCOUNTER — Other Ambulatory Visit: Payer: Self-pay | Admitting: Family Medicine

## 2020-09-09 NOTE — Telephone Encounter (Signed)
Calcitonin-salmon Last filled:  08/15/20, #3.7 mL Last OV:  12/10/19, back pain Next OV:  09/12/20, 6 wk back pain f/u

## 2020-09-10 ENCOUNTER — Telehealth: Payer: Self-pay | Admitting: Family Medicine

## 2020-09-10 NOTE — Telephone Encounter (Signed)
And she stated that if he needed her to come in she will need a wheel chair, and wanted to know if she needed a orthopedic.

## 2020-09-10 NOTE — Telephone Encounter (Signed)
ERx 

## 2020-09-10 NOTE — Telephone Encounter (Signed)
She stated that if you cant reach her on the home phone then you could call her husband phone [phone]

## 2020-09-10 NOTE — Telephone Encounter (Signed)
Pt called in she stated that she hurt her hip in July and again December. And wanted to know if she needed to come in for the appointment on Friday due to she is having a hard time moving around or can she be done over the phone. And she stated if he needed to x-ray her again due to Dr. Lorelei Pont x-rayed when she was here and had her standing up and laying down and when she laid there she felt worst after laying there.

## 2020-09-11 NOTE — Telephone Encounter (Signed)
Benefits received. Patient would owe $280 and it needs a PA Spoke with patient in detail about this, patient is coming to see Dr Darnell Level tomorrow and will discuss further. Has questions about this.

## 2020-09-11 NOTE — Telephone Encounter (Signed)
Spoke with patient today. Patient had several questions and concerns about her fracture and her pain. Advised patient that due to all the issues and concerns it would be very beneficial to see Dr Darnell Level tomorrow instead of trying to address through telephone. Patient will do her best to come in the morning, advised patient to pull up front and call us so we can come and help her out into a wheelchair. Patient appreciated it and will call us back if anything changes.

## 2020-09-12 ENCOUNTER — Encounter: Payer: Self-pay | Admitting: Family Medicine

## 2020-09-12 ENCOUNTER — Other Ambulatory Visit: Payer: Self-pay

## 2020-09-12 ENCOUNTER — Ambulatory Visit (INDEPENDENT_AMBULATORY_CARE_PROVIDER_SITE_OTHER): Payer: Medicare Other | Admitting: Family Medicine

## 2020-09-12 VITALS — BP 140/72 | HR 84 | Temp 97.4°F | Ht 62.0 in

## 2020-09-12 DIAGNOSIS — N1832 Chronic kidney disease, stage 3b: Secondary | ICD-10-CM

## 2020-09-12 DIAGNOSIS — S22000D Wedge compression fracture of unspecified thoracic vertebra, subsequent encounter for fracture with routine healing: Secondary | ICD-10-CM

## 2020-09-12 DIAGNOSIS — M8000XD Age-related osteoporosis with current pathological fracture, unspecified site, subsequent encounter for fracture with routine healing: Secondary | ICD-10-CM | POA: Diagnosis not present

## 2020-09-12 DIAGNOSIS — E039 Hypothyroidism, unspecified: Secondary | ICD-10-CM | POA: Diagnosis not present

## 2020-09-12 DIAGNOSIS — R000 Tachycardia, unspecified: Secondary | ICD-10-CM

## 2020-09-12 DIAGNOSIS — S22060G Wedge compression fracture of T7-T8 vertebra, subsequent encounter for fracture with delayed healing: Secondary | ICD-10-CM | POA: Diagnosis not present

## 2020-09-12 LAB — RENAL FUNCTION PANEL
Albumin: 4.2 g/dL (ref 3.5–5.2)
BUN: 26 mg/dL — ABNORMAL HIGH (ref 6–23)
CO2: 29 mEq/L (ref 19–32)
Calcium: 11.7 mg/dL — ABNORMAL HIGH (ref 8.4–10.5)
Chloride: 103 mEq/L (ref 96–112)
Creatinine, Ser: 1.69 mg/dL — ABNORMAL HIGH (ref 0.40–1.20)
GFR: 27.56 mL/min — ABNORMAL LOW (ref >60.00)
Glucose, Bld: 100 mg/dL — ABNORMAL HIGH (ref 70–99)
Phosphorus: 4 mg/dL (ref 2.3–4.6)
Potassium: 4.9 mEq/L (ref 3.5–5.1)
Sodium: 142 mEq/L (ref 135–145)

## 2020-09-12 LAB — T4, FREE: Free T4: 0.91 ng/dL (ref 0.60–1.60)

## 2020-09-12 LAB — TSH: TSH: 4.54 u[IU]/mL — ABNORMAL HIGH (ref 0.35–4.50)

## 2020-09-12 LAB — VITAMIN D 25 HYDROXY (VIT D DEFICIENCY, FRACTURES): VITD: 48.26 ng/mL (ref 30.00–100.00)

## 2020-09-12 MED ORDER — VITAMIN D3 25 MCG (1000 UT) PO CAPS: 1.0000 | ORAL_CAPSULE | Freq: Every day | ORAL | Status: AC

## 2020-09-12 NOTE — Patient Instructions (Addendum)
I do recommend you try tramadol - start at 1/2 tablet daily for pain relief.  Take nasal salmon calcitonin for 1 month - this may help pain.  Ensure 1269m calcium/day and vitamin D 1000 units daily.  Do consider prolia injections every 6 months, let uKoreaknow as I'd like to get start on them as quickly as possible. Handout on prolia provided below as well as handout on osteoporosis.  Labs today.   PROLIA Denosumab injection What is this medicine? DENOSUMAB (den oh sue mab) slows bone breakdown. Prolia is used to treat osteoporosis in women after menopause and in men, and in people who are taking corticosteroids for 6 months or more. XDelton Seeis used to treat a high calcium level due to cancer and to prevent bone fractures and other bone problems caused by multiple myeloma or cancer bone metastases. XDelton Seeis also used to treat giant cell tumor of the bone. This medicine may be used for other purposes; ask your health care provider or pharmacist if you have questions. COMMON BRAND NAME(S): Prolia, XGEVA What should I tell my health care provider before I take this medicine? They need to know if you have any of these conditions:  dental disease  having surgery or tooth extraction  infection  kidney disease  low levels of calcium or Vitamin D in the blood  malnutrition  on hemodialysis  skin conditions or sensitivity  thyroid or parathyroid disease  an unusual reaction to denosumab, other medicines, foods, dyes, or preservatives  pregnant or trying to get pregnant  breast-feeding How should I use this medicine? This medicine is for injection under the skin. It is given by a health care professional in a hospital or clinic setting. A special MedGuide will be given to you before each treatment. Be sure to read this information carefully each time. For Prolia, talk to your pediatrician regarding the use of this medicine in children. Special care may be needed. For XDelton See talk to your  pediatrician regarding the use of this medicine in children. While this drug may be prescribed for children as young as 13 years for selected conditions, precautions do apply. Overdosage: If you think you have taken too much of this medicine contact a poison control center or emergency room at once. NOTE: This medicine is only for you. Do not share this medicine with others. What if I miss a dose? It is important not to miss your dose. Call your doctor or health care professional if you are unable to keep an appointment. What may interact with this medicine? Do not take this medicine with any of the following medications:  other medicines containing denosumab This medicine may also interact with the following medications:  medicines that lower your chance of fighting infection  steroid medicines like prednisone or cortisone This list may not describe all possible interactions. Give your health care provider a list of all the medicines, herbs, non-prescription drugs, or dietary supplements you use. Also tell them if you smoke, drink alcohol, or use illegal drugs. Some items may interact with your medicine. What should I watch for while using this medicine? Visit your doctor or health care professional for regular checks on your progress. Your doctor or health care professional may order blood tests and other tests to see how you are doing. Call your doctor or health care professional for advice if you get a fever, chills or sore throat, or other symptoms of a cold or flu. Do not treat yourself. This drug may  decrease your body's ability to fight infection. Try to avoid being around people who are sick. You should make sure you get enough calcium and vitamin D while you are taking this medicine, unless your doctor tells you not to. Discuss the foods you eat and the vitamins you take with your health care professional. See your dentist regularly. Brush and floss your teeth as directed. Before you  have any dental work done, tell your dentist you are receiving this medicine. Do not become pregnant while taking this medicine or for 5 months after stopping it. Talk with your doctor or health care professional about your birth control options while taking this medicine. Women should inform their doctor if they wish to become pregnant or think they might be pregnant. There is a potential for serious side effects to an unborn child. Talk to your health care professional or pharmacist for more information. What side effects may I notice from receiving this medicine? Side effects that you should report to your doctor or health care professional as soon as possible:  allergic reactions like skin rash, itching or hives, swelling of the face, lips, or tongue  bone pain  breathing problems  dizziness  jaw pain, especially after dental work  redness, blistering, peeling of the skin  signs and symptoms of infection like fever or chills; cough; sore throat; pain or trouble passing urine  signs of low calcium like fast heartbeat, muscle cramps or muscle pain; pain, tingling, numbness in the hands or feet; seizures  unusual bleeding or bruising  unusually weak or tired Side effects that usually do not require medical attention (report to your doctor or health care professional if they continue or are bothersome):  constipation  diarrhea  headache  joint pain  loss of appetite  muscle pain  runny nose  tiredness  upset stomach This list may not describe all possible side effects. Call your doctor for medical advice about side effects. You may report side effects to FDA at 1-800-FDA-1088. Where should I keep my medicine? This medicine is only given in a clinic, doctor's office, or other health care setting and will not be stored at home. NOTE: This sheet is a summary. It may not cover all possible information. If you have questions about this medicine, talk to your doctor,  pharmacist, or health care provider.  2021 Elsevier/Gold Standard (2017-09-02 16:10:44)   Osteoporosis  Osteoporosis happens when the bones become thin and less dense than normal. Osteoporosis makes bones more brittle and fragile and more likely to break (fracture). Over time, osteoporosis can cause your bones to become so weak that they fracture after a minor fall. Bones in the hip, wrist, and spine are most likely to fracture due to osteoporosis. What are the causes? The exact cause of this condition is not known. What increases the risk? You are more likely to develop this condition if you:  Have family members with this condition.  Have poor nutrition.  Use the following: ? Steroid medicines, such as prednisone. ? Anti-seizure medicines. ? Nicotine or tobacco, such as cigarettes, e-cigarettes, and chewing tobacco.  Are female.  Are age 7 or older.  Are not physically active (are sedentary).  Are of European or Asian descent.  Have a small body frame. What are the signs or symptoms? A fracture might be the first sign of osteoporosis, especially if the fracture results from a fall or injury that usually would not cause a bone to break. Other signs and symptoms include:  Pain in the neck or low back.  Stooped posture.  Loss of height. How is this diagnosed? This condition may be diagnosed based on:  Your medical history.  A physical exam.  A bone mineral density test, also called a DXA or DEXA test (dual-energy X-ray absorptiometry test). This test uses X-rays to measure the amount of minerals in your bones. How is this treated? This condition may be treated by:  Making lifestyle changes, such as: ? Including foods with more calcium and vitamin D in your diet. ? Doing weight-bearing and muscle-strengthening exercises. ? Stopping tobacco use. ? Limiting alcohol intake.  Taking medicine to slow the process of bone loss or to increase bone density.  Taking  daily supplements of calcium and vitamin D.  Taking hormone replacement medicines, such as estrogen for women and testosterone for men.  Monitoring your levels of calcium and vitamin D. The goal of treatment is to strengthen your bones and lower your risk for a fracture. Follow these instructions at home: Eating and drinking Include calcium and vitamin D in your diet. Calcium is important for bone health, and vitamin D helps your body absorb calcium. Good sources of calcium and vitamin D include:  Certain fatty fish, such as salmon and tuna.  Products that have calcium and vitamin D added to them (are fortified), such as fortified cereals.  Egg yolks.  Cheese.  Liver.   Activity Do exercises as told by your health care provider. Ask your health care provider what exercises and activities are safe for you. You should do:  Exercises that make you work against gravity (weight-bearing exercises), such as tai chi, yoga, or walking.  Exercises to strengthen muscles, such as lifting weights. Lifestyle  Do not drink alcohol if: ? Your health care provider tells you not to drink. ? You are pregnant, may be pregnant, or are planning to become pregnant.  If you drink alcohol: ? Limit how much you use to:  0-1 drink a day for women.  0-2 drinks a day for men.  Know how much alcohol is in your drink. In the [place] one drink equals one 12 oz bottle of beer (355 mL), one 5 oz glass of wine (148 mL), or one 1 oz glass of hard liquor (44 mL).  Do not use any products that contain nicotine or tobacco, such as cigarettes, e-cigarettes, and chewing tobacco. If you need help quitting, ask your health care provider. Preventing falls  Use devices to help you move around (mobility aids) as needed, such as canes, walkers, scooters, or crutches.  Keep rooms well-lit and clutter-free.  Remove tripping hazards from walkways, including cords and throw rugs.  Install grab bars in bathrooms and  safety rails on stairs.  Use rubber mats in the bathroom and other areas that are often wet or slippery.  Wear closed-toe shoes that fit well and support your feet. Wear shoes that have rubber soles or low heels.  Review your medicines with your health care provider. Some medicines can cause dizziness or changes in blood pressure, which can increase your risk of falling. General instructions  Take over-the-counter and prescription medicines only as told by your health care provider.  Keep all follow-up visits. This is important. Contact a health care provider if:  You have never been screened for osteoporosis and you are: ? A woman who is age 56 or older. ? A man who is age 52 or older. Get help right away if:  You fall or injure  yourself. Summary  Osteoporosis is thinning and loss of density in your bones. This makes bones more brittle and fragile and more likely to break (fracture),even with minor falls.  The goal of treatment is to strengthen your bones and lower your risk for a fracture.  Include calcium and vitamin D in your diet. Calcium is important for bone health, and vitamin D helps your body absorb calcium.  Talk with your health care provider about screening for osteoporosis if you are a woman who is age 52 or older, or a man who is age 42 or older. This information is not intended to replace advice given to you by your health care provider. Make sure you discuss any questions you have with your health care provider. Document Revised: 10/11/2019 Document Reviewed: 10/11/2019 Elsevier Patient Education  [place].

## 2020-09-12 NOTE — Progress Notes (Signed)
Patient ID: Diana Brewer, female    DOB: 1933-08-05, 87 y.o.   MRN: [id number]  This visit was conducted in person.  BP 140/72   Pulse 84   Temp (!) 97.4 F (36.3 C) (Temporal)   Ht 5' 2"  (1.575 m)   SpO2 94%   BMI 26.98 kg/m    CC: f/u back pain  Subjective:   HPI: Diana Brewer is a 87 y.o. female presenting on 09/12/2020 for Back Pain (Here for 6 wk f/u.  Pt accompanied by husband, [name]- temp 97.8.)   See prior note for details.  Saw Dr Lorelei Pont 07/2020 with newly found T9 compression fracture, possible old fractures at T5/T11, definite old fracture T7 (sustained summer 2021)  Pain all over - feels this pain is worse since she lay on hard table at Xray last time.  Struggling to get onto bed due to pain - declines hospital bed.   She was hesitant to take tramadol - did not try.  She has increased calcium in diet to 1219m/day.  She has started vitamin D supplement.   Limited in osteoporosis treatment due to known CKD and prior GI issues.  We priced out prolia - $280, needs PA.   She has had abnormal TSH readings over last several years.  Denies cold intolerance, unexpected weight gain.   Chronic constipation.  Ongoing fatigue since COVID infection.     Relevant past medical, surgical, family and social history reviewed and updated as indicated. Interim medical history since our last visit reviewed. Allergies and medications reviewed and updated. Outpatient Medications Prior to Visit  Medication Sig Dispense Refill  . acetaminophen (TYLENOL) 500 MG tablet Take 1 tablet (500 mg total) by mouth in the morning, at noon, and at bedtime.    .Marland Kitchenaspirin EC 81 MG tablet Take 1 tablet (81 mg total) by mouth daily. Swallow whole. 30 tablet 11  . calcitonin, salmon, (MIACALCIN/FORTICAL) 200 UNIT/ACT nasal spray PLACE 1 SPRAY INTO ALTERNATE NOSTRILS DAILY. 3.7 mL 0  . diltiazem (CARDIZEM CD) 180 MG 24 hr capsule Take 1 capsule (180 mg total) by mouth daily. 90 capsule 1   . ezetimibe (ZETIA) 10 MG tablet Take 1 tablet (10 mg total) by mouth daily. 90 tablet 3  . folic acid (FOLVITE) 1 MG tablet Take 1 tablet (1 mg total) by mouth daily.    . Multiple Vitamins-Minerals (CENTRUM SILVER) tablet Take 1 tablet by mouth daily.    . propranolol (INDERAL) 40 MG tablet TAKE 1 TABLET BY MOUTH TWICE A DAY 180 tablet 3  . sulfaSALAzine (AZULFIDINE) 500 MG tablet TAKE 2 TABLETS BY MOUTH TWICE A DAY 120 tablet 2  . Wheat Dextrin (BENEFIBER PO) 1 tablespoon daily    . mesalamine (ROWASA) 4 g enema PLACE 60 MLS (4 G TOTAL) RECTALLY AT BEDTIME. 1800 mL 2  . traMADol (ULTRAM) 50 MG tablet Take 1 tablet (50 mg total) by mouth every 8 (eight) hours as needed for moderate pain. (Patient not taking: Reported on 09/12/2020) 20 tablet 0   No facility-administered medications prior to visit.     Per HPI unless specifically indicated in ROS section below Review of Systems Objective:  BP 140/72   Pulse 84   Temp (!) 97.4 F (36.3 C) (Temporal)   Ht 5' 2"  (1.575 m)   SpO2 94%   BMI 26.98 kg/m   Wt Readings from Last 3 Encounters:  07/31/20 147 lb 8 oz (66.9 kg)  07/03/20 147 lb (66.7 kg)  04/25/20 145 lb 3.2 oz (65.9 kg)      Physical Exam Vitals and nursing note reviewed.  Constitutional:      Appearance: Normal appearance. She is not ill-appearing.     Comments: Sitting in wheelchair throughout office visit   Cardiovascular:     Rate and Rhythm: Normal rate and regular rhythm.     Pulses: Normal pulses.     Heart sounds: Normal heart sounds. No murmur heard.   Pulmonary:     Effort: Pulmonary effort is normal. No respiratory distress.     Breath sounds: Normal breath sounds. No wheezing, rhonchi or rales.  Musculoskeletal:        General: Tenderness present.     Right lower leg: No edema.     Left lower leg: No edema.     Comments:  No cervical neck tenderness Midline mid to lower thoracic spine tenderness to palpation  No significant thoracic paraspinous mm  tenderness   Skin:    General: Skin is warm and dry.     Findings: No rash.  Neurological:     Mental Status: She is alert.  Psychiatric:        Mood and Affect: Mood normal.        Behavior: Behavior normal.       Results for orders placed or performed in visit on 09/12/20  Renal function panel  Result Value Ref Range   Sodium 142 135 - 145 mEq/L   Potassium 4.9 3.5 - 5.1 mEq/L   Chloride 103 96 - 112 mEq/L   CO2 29 19 - 32 mEq/L   Albumin 4.2 3.5 - 5.2 g/dL   BUN 26 (H) 6 - 23 mg/dL   Creatinine, Ser 1.69 (H) 0.40 - 1.20 mg/dL   Glucose, Bld 100 (H) 70 - 99 mg/dL   Phosphorus 4.0 2.3 - 4.6 mg/dL   GFR 27.56 (L) >60.00 mL/min   Calcium 11.7 (H) 8.4 - 10.5 mg/dL  T4, free  Result Value Ref Range   Free T4 0.91 0.60 - 1.60 ng/dL  T3  Result Value Ref Range   T3, Total 82 76 - 181 ng/dL  TSH  Result Value Ref Range   TSH 4.54 (H) 0.35 - 4.50 uIU/mL  VITAMIN D 25 Hydroxy (Vit-D Deficiency, Fractures)  Result Value Ref Range   VITD 48.26 30.00 - 100.00 ng/mL   Assessment & Plan:  This visit occurred during the SARS-CoV-2 public health emergency.  Safety protocols were in place, including screening questions prior to the visit, additional usage of staff PPE, and extensive cleaning of exam room while observing appropriate contact time as indicated for disinfecting solutions.   Problem List Items Addressed This Visit    CKD (chronic kidney disease) stage 3, GFR 30-59 ml/min (HCC) (Chronic)    Update labs today.       Relevant Orders   Renal function panel (Completed)   VITAMIN D 25 Hydroxy (Vit-D Deficiency, Fractures) (Completed)   Closed compression fracture of thoracic vertebra ([place]) - Primary    Ongoing pain due to compression fractures.  Has been hesitant to try any medications prescribed to date (calcitonin and tramadol). Tylenol ineffective for pain control. Avoiding NSAIDs in GI hx (ulcerative coiltis) and CKD       Osteoporosis    Reviewed etiology of  recent fractures. Reviewed treatment options. She has been hesitant to try any medications prescribed (tramadol, calcitonin, bisphosphonate, prolia, etc). Recommend she start nasal calcitonin x1-2 months acutely while we start better  long term strategy. Again reviewed tramadol use, encouraged she try at 1/4-1/2 tab at a time given her concerns over dangers of medication.  Reviewed calcium and vitamin D intake.  No recent DEXA - dx based on multiple compression fractures. Discussed DEXA - she declines as it wouldn't change acute management.  prolia and osteoporosis handouts provided today.       Relevant Medications   Cholecalciferol (VITAMIN D3) 25 MCG (1000 UT) CAPS   Acquired hypothyroidism    H/o this - update levels today - hypothyroidism could contribute to osteoporosis risk.      Relevant Orders   T4, free (Completed)   T3 (Completed)   TSH (Completed)       Meds ordered this encounter  Medications  . Cholecalciferol (VITAMIN D3) 25 MCG (1000 UT) CAPS    Sig: Take 1 capsule (1,000 Units total) by mouth daily.    Dispense:  30 capsule   Orders Placed This Encounter  Procedures  . Renal function panel  . T4, free  . T3  . TSH  . VITAMIN D 25 Hydroxy (Vit-D Deficiency, Fractures)    Patient instructions: I do recommend you try tramadol - start at 1/2 tablet daily for pain relief.  Take nasal salmon calcitonin for 1 month - this may help pain.  Ensure 1236m calcium/day and vitamin D 1000 units daily.  Do consider prolia injections every 6 months, let [place]ore[place]now as I'd like to get start on them as quickly as possible. Handout on prolia provided below as well as handout on osteoporosis.  Labs today.   Follow up plan: Return if symptoms worsen or fail to improve.  JRia Bush MD

## 2020-09-12 NOTE — Progress Notes (Signed)
Order(s) created erroneously. Erroneous order ID: [id number]  Order moved by: Genia Harold D  Order move date/time: 09/12/2020 2:49 PM  Source Patient: [id number]  Source Contact: 09/12/2020  Destination Patient: [id number]  Destination Contact: 12/27/2019

## 2020-09-13 LAB — T3: T3, Total: 82 ng/dL (ref 76–181)

## 2020-09-16 DIAGNOSIS — M810 Age-related osteoporosis without current pathological fracture: Secondary | ICD-10-CM | POA: Insufficient documentation

## 2020-09-16 DIAGNOSIS — E039 Hypothyroidism, unspecified: Secondary | ICD-10-CM | POA: Insufficient documentation

## 2020-09-16 NOTE — Assessment & Plan Note (Signed)
Update labs today.

## 2020-09-16 NOTE — Assessment & Plan Note (Addendum)
Reviewed etiology of recent fractures. Reviewed treatment options. She has been hesitant to try any medications prescribed (tramadol, calcitonin, bisphosphonate, prolia, etc). Recommend she start nasal calcitonin x1-2 months acutely while we start better long term strategy. Again reviewed tramadol use, encouraged she try at 1/4-1/2 tab at a time given her concerns over dangers of medication.  Reviewed calcium and vitamin D intake.  No recent DEXA - dx based on multiple compression fractures. Discussed DEXA - she declines as it wouldn't change acute management.  prolia and osteoporosis handouts provided today.

## 2020-09-16 NOTE — Assessment & Plan Note (Signed)
H/o this - update levels today - hypothyroidism could contribute to osteoporosis risk.

## 2020-09-16 NOTE — Assessment & Plan Note (Signed)
Ongoing pain due to compression fractures.  Has been hesitant to try any medications prescribed to date (calcitonin and tramadol). Tylenol ineffective for pain control. Avoiding NSAIDs in GI hx (ulcerative coiltis) and CKD

## 2020-09-17 ENCOUNTER — Telehealth: Payer: Self-pay | Admitting: Cardiovascular Disease

## 2020-09-17 NOTE — Telephone Encounter (Incomplete)
PT is caling reque

## 2020-09-18 ENCOUNTER — Telehealth: Payer: Self-pay | Admitting: Internal Medicine

## 2020-09-18 NOTE — Telephone Encounter (Signed)
Attempted to call pt back but get fast busy signal. WIll try again

## 2020-09-19 NOTE — Telephone Encounter (Signed)
Pt states she has fractured her back for the 2nd time. She wanted to let Dr. Hilarie Fredrickson know that she is not able to use her mesalamine enemas because she cannot use it. She wants to know what Dr. Hilarie Fredrickson thinks about Prolia. Her PCP has recommended this and the way she understood she would get it every 6 mths but if she missed a dose she could actually get worse. Please advise.

## 2020-09-22 ENCOUNTER — Other Ambulatory Visit: Payer: Self-pay | Admitting: Cardiovascular Disease

## 2020-09-22 NOTE — Telephone Encounter (Signed)
Spoke with pt and she is aware.

## 2020-09-22 NOTE — Telephone Encounter (Signed)
Regarding her UC she should continue the sulfasalazine and folic acid at current dose She can resume the mesalamine enemas when she can physically administer the medication  I think Prolia would be a good decision to help fortify bones and improve bone health

## 2020-09-24 ENCOUNTER — Telehealth: Payer: Self-pay

## 2020-09-24 DIAGNOSIS — M8000XD Age-related osteoporosis with current pathological fracture, unspecified site, subsequent encounter for fracture with routine healing: Secondary | ICD-10-CM

## 2020-09-24 DIAGNOSIS — S22000G Wedge compression fracture of unspecified thoracic vertebra, subsequent encounter for fracture with delayed healing: Secondary | ICD-10-CM

## 2020-09-24 NOTE — Telephone Encounter (Signed)
Patient states that she has tried taking Tramadol the past 3 days, with food. And she has been having decreased appetite and nausea since then, upset stomach. She has not changed anything else and feels like this is from this medication-also states possible side effects for this medication is nausea she saw. She is not going to use the nasal spray Dr Darnell Level suggested for the pain due to she saw [place] took it off the market due to certain side effects per patient. Patient states she can not take Ibuprofen type of medication due to her stomach. She does not want to take Opiods due to possible side effects. Are there any other options for the patient at this time?  Also see separate message about Prolia questions from the patient. Thank you

## 2020-09-24 NOTE — Telephone Encounter (Signed)
Called insurance and PA is not needed for Prolia unless this is given due to cancer DX.  Spoke with patient. Patient was also advised of her recent lab results and scheduled for re check on her calcium on June 8th. Patient would like to know if she needs to wait for Prolia injection until after re checking her labs in June? Patient is asking if she needs to have Bone Density scan done before the injection? Patient is asking if there will be a reversal harm to her bones if she at any time in the future has to stop Prolia injections? Will she have to get this injection for the rest of her life?  Also sending separate message about patient's pain and medication management. Thank you

## 2020-09-25 NOTE — Addendum Note (Signed)
Addended by: [name] on: 09/25/2020 03:38 PM   Modules accepted: Orders

## 2020-09-25 NOTE — Telephone Encounter (Signed)
I think prudent to wait to recheck calcium levels and kidney function in June prior to Prolia injection. Doesn't need to wait for DEXA scan.  Will get benefit for prolia for 6 months after each injection while she remains on it. If decides to stop, do recommend transitioning to different osteoporosis medicine at least temporarily to decrease fracture risk after stopping prolia.

## 2020-09-25 NOTE — Telephone Encounter (Signed)
Ok to stop tramadol at this time. Added to intolerance list.  No other good options. Recommend tylenol 1010m three times daily.  Would have her consider nasal calcitonin spray even for 1-2 weeks.  Would also consider cement injection into spine if ongoing severe pain without any improvement.

## 2020-09-26 ENCOUNTER — Other Ambulatory Visit: Payer: Self-pay | Admitting: Family Medicine

## 2020-09-29 NOTE — Telephone Encounter (Signed)
Patient advised. Patient was asking if she should be wearing a back brace to help her with pain and back support? Patient has done the Tylenol as suggested and states it was "like drinking water." Advised patient that there were not a lot of other options. Patient does not feel safe using the nasal spray.  Patient did want to say that her pain is worse with trying to get up and been on her feet, it is better some with sitting and laying down.

## 2020-09-29 NOTE — Telephone Encounter (Signed)
Patient advised.

## 2020-09-29 NOTE — Telephone Encounter (Signed)
Just temporary Rx

## 2020-10-11 ENCOUNTER — Other Ambulatory Visit: Payer: Self-pay | Admitting: Family Medicine

## 2020-10-11 DIAGNOSIS — N183 Chronic kidney disease, stage 3 unspecified: Secondary | ICD-10-CM

## 2020-10-11 DIAGNOSIS — M8000XD Age-related osteoporosis with current pathological fracture, unspecified site, subsequent encounter for fracture with routine healing: Secondary | ICD-10-CM

## 2020-10-11 NOTE — Telephone Encounter (Signed)
plz call for an update. If pain hasn't started improving by now, would offer referral to spine doctor to discuss possible kyphoplasty (cement into spine).  Also would be reasonable to try brace, however don't recommend use past weeks to 1 month as too much reliance on brace can weaken abdominal/back muscles.

## 2020-10-13 NOTE — Addendum Note (Signed)
Addended by: [name] on: 10/13/2020 12:19 PM   Modules accepted: Orders

## 2020-10-13 NOTE — Telephone Encounter (Addendum)
Spoke with pt relaying Dr. Synthia Innocent message.  Pt verbalizes understanding.  Agrees to see spine doc.  Does not have preference of [facility] or [place].

## 2020-10-13 NOTE — Telephone Encounter (Signed)
TLSO brace Rx written and given to [facility].

## 2020-10-15 ENCOUNTER — Other Ambulatory Visit: Payer: Self-pay

## 2020-10-15 ENCOUNTER — Other Ambulatory Visit (INDEPENDENT_AMBULATORY_CARE_PROVIDER_SITE_OTHER): Payer: Medicare Other

## 2020-10-15 DIAGNOSIS — N183 Chronic kidney disease, stage 3 unspecified: Secondary | ICD-10-CM

## 2020-10-15 LAB — RENAL FUNCTION PANEL
Albumin: 4.2 g/dL (ref 3.5–5.2)
BUN: 21 mg/dL (ref 6–23)
CO2: 28 mEq/L (ref 19–32)
Calcium: 11.3 mg/dL — ABNORMAL HIGH (ref 8.4–10.5)
Chloride: 104 mEq/L (ref 96–112)
Creatinine, Ser: 1.55 mg/dL — ABNORMAL HIGH (ref 0.40–1.20)
GFR: 30.55 mL/min — ABNORMAL LOW (ref >60.00)
Glucose, Bld: 98 mg/dL (ref 70–99)
Phosphorus: 3.2 mg/dL (ref 2.3–4.6)
Potassium: 4.3 mEq/L (ref 3.5–5.1)
Sodium: 140 mEq/L (ref 135–145)

## 2020-10-15 LAB — CBC WITH DIFFERENTIAL/PLATELET
Basophils Absolute: 0.1 10*3/uL (ref 0.0–0.1)
Basophils Relative: 1.1 % (ref 0.0–3.0)
Eosinophils Absolute: 0.4 10*3/uL (ref 0.0–0.7)
Eosinophils Relative: 3.9 % (ref 0.0–5.0)
HCT: 32.9 % — ABNORMAL LOW (ref 36.0–46.0)
Hemoglobin: 11.2 g/dL — ABNORMAL LOW (ref 12.0–15.0)
Lymphocytes Relative: 16.3 % (ref 12.0–46.0)
Lymphs Abs: 1.5 10*3/uL (ref 0.7–4.0)
MCHC: 34.1 g/dL (ref 30.0–36.0)
MCV: 100.3 fl — ABNORMAL HIGH (ref 78.0–100.0)
Monocytes Absolute: 1.1 10*3/uL — ABNORMAL HIGH (ref 0.1–1.0)
Monocytes Relative: 11.8 % (ref 3.0–12.0)
Neutro Abs: 6.2 10*3/uL (ref 1.4–7.7)
Neutrophils Relative %: 66.9 % (ref 43.0–77.0)
Platelets: 521 10*3/uL — ABNORMAL HIGH (ref 150.0–400.0)
RBC: 3.28 Mil/uL — ABNORMAL LOW (ref 3.87–5.11)
RDW: 14.5 % (ref 11.5–15.5)
WBC: 9.3 10*3/uL (ref 4.0–10.5)

## 2020-10-15 NOTE — Addendum Note (Signed)
Addended by: [name] on: 10/15/2020 05:25 PM   Modules accepted: Orders

## 2020-10-17 ENCOUNTER — Other Ambulatory Visit: Payer: Self-pay | Admitting: Cardiovascular Disease

## 2020-10-17 ENCOUNTER — Telehealth: Payer: Self-pay | Admitting: Cardiovascular Disease

## 2020-10-17 ENCOUNTER — Other Ambulatory Visit: Payer: Self-pay

## 2020-10-17 MED ORDER — DILTIAZEM HCL ER COATED BEADS 180 MG PO CP24: ORAL_CAPSULE | ORAL | 3 refills | Status: AC

## 2020-10-17 NOTE — Telephone Encounter (Signed)
Called patient, advised we had sent it in to the pharmacy in May, but they did not receive it- she is requesting it to be resent.  Patient verbalized understanding.  Thankful for call back, RX sent to pharmacy.

## 2020-10-17 NOTE — Telephone Encounter (Signed)
PT IS CALLING BECAUSE SHE IS TRYING TO REFILL HER diltazem AND IT IS BEING DENIED BY PRESCRIBER, PLEASE ADVISE

## 2020-10-19 ENCOUNTER — Encounter: Payer: Self-pay | Admitting: Family Medicine

## 2020-10-21 LAB — PTH-RELATED PEPTIDE: PTH-Related Protein (PTH-RP): 15 pg/mL (ref 11–20)

## 2020-10-23 ENCOUNTER — Other Ambulatory Visit: Payer: Self-pay

## 2020-10-23 ENCOUNTER — Ambulatory Visit (INDEPENDENT_AMBULATORY_CARE_PROVIDER_SITE_OTHER): Payer: Medicare Other

## 2020-10-23 DIAGNOSIS — Z0000 Encounter for general adult medical examination without abnormal findings: Secondary | ICD-10-CM

## 2020-10-23 NOTE — Patient Instructions (Signed)
Diana Brewer , Thank you for taking time to come for your Medicare Wellness Visit. I appreciate your ongoing commitment to your health goals. Please review the following plan we discussed and let me know if I can assist you in the future.   Screening recommendations/referrals: Colonoscopy: no longer required  Mammogram: no longer required  Bone Density: due, will discuss with provider  Recommended yearly ophthalmology/optometry visit for glaucoma screening and checkup Recommended yearly dental visit for hygiene and checkup  Vaccinations: Influenza vaccine: due Fall 2022  Pneumococcal vaccine: declined Tdap vaccine: decline-insurance  Shingles vaccine: due, check with your insurance regarding coverage if interested    Covid-19:declined   Advanced directives: Advance directive discussed with you today. Even though you declined this today please call our office should you change your mind and we can give you the proper paperwork for you to fill out.  Conditions/risks identified: hypercholesterolemia   Next appointment: Follow up in one year for your annual wellness visit    Preventive Care 2 Years and Older, Female Preventive care refers to lifestyle choices and visits with your health care provider that can promote health and wellness. What does preventive care include? A yearly physical exam. This is also called an annual well check. Dental exams once or twice a year. Routine eye exams. Ask your health care provider how often you should have your eyes checked. Personal lifestyle choices, including: Daily care of your teeth and gums. Regular physical activity. Eating a healthy diet. Avoiding tobacco and drug use. Limiting alcohol use. Practicing safe sex. Taking low-dose aspirin every day. Taking vitamin and mineral supplements as recommended by your health care provider. What happens during an annual well check? The services and screenings done by your health care provider  during your annual well check will depend on your age, overall health, lifestyle risk factors, and family history of disease. Counseling  Your health care provider may ask you questions about your: Alcohol use. Tobacco use. Drug use. Emotional well-being. Home and relationship well-being. Sexual activity. Eating habits. History of falls. Memory and ability to understand (cognition). Work and work Statistician. Reproductive health. Screening  You may have the following tests or measurements: Height, weight, and BMI. Blood pressure. Lipid and cholesterol levels. These may be checked every 5 years, or more frequently if you are over 59 years old. Skin check. Lung cancer screening. You may have this screening every year starting at age 60 if you have a 30-pack-year history of smoking and currently smoke or have quit within the past 15 years. Fecal occult blood test (FOBT) of the stool. You may have this test every year starting at age 28. Flexible sigmoidoscopy or colonoscopy. You may have a sigmoidoscopy every 5 years or a colonoscopy every 10 years starting at age 68. Hepatitis C blood test. Hepatitis B blood test. Sexually transmitted disease (STD) testing. Diabetes screening. This is done by checking your blood sugar (glucose) after you have not eaten for a while (fasting). You may have this done every 1-3 years. Bone density scan. This is done to screen for osteoporosis. You may have this done starting at age 89. Mammogram. This may be done every 1-2 years. Talk to your health care provider about how often you should have regular mammograms. Talk with your health care provider about your test results, treatment options, and if necessary, the need for more tests. Vaccines  Your health care provider may recommend certain vaccines, such as: Influenza vaccine. This is recommended every year. Tetanus, diphtheria,  and acellular pertussis (Tdap, Td) vaccine. You may need a Td booster every  10 years. Zoster vaccine. You may need this after age 76. Pneumococcal 13-valent conjugate (PCV13) vaccine. One dose is recommended after age 76. Pneumococcal polysaccharide (PPSV23) vaccine. One dose is recommended after age 100. Talk to your health care provider about which screenings and vaccines you need and how often you need them. This information is not intended to replace advice given to you by your health care provider. Make sure you discuss any questions you have with your health care provider. Document Released: 05/23/2015 Document Revised: 01/14/2016 Document Reviewed: 02/25/2015 Elsevier Interactive Patient Education  2017 [place] Prevention in the Home Falls can cause injuries. They can happen to people of all ages. There are many things you can do to make your home safe and to help prevent falls. What can I do on the outside of my home? Regularly fix the edges of walkways and driveways and fix any cracks. Remove anything that might make you trip as you walk through a door, such as a raised step or threshold. Trim any bushes or trees on the path to your home. Use bright outdoor lighting. Clear any walking paths of anything that might make someone trip, such as rocks or tools. Regularly check to see if handrails are loose or broken. Make sure that both sides of any steps have handrails. Any raised decks and porches should have guardrails on the edges. Have any leaves, snow, or ice cleared regularly. Use sand or salt on walking paths during winter. Clean up any spills in your garage right away. This includes oil or grease spills. What can I do in the bathroom? Use night lights. Install grab bars by the toilet and in the tub and shower. Do not use towel bars as grab bars. Use non-skid mats or decals in the tub or shower. If you need to sit down in the shower, use a plastic, non-slip stool. Keep the floor dry. Clean up any water that spills on the floor as soon as it  happens. Remove soap buildup in the tub or shower regularly. Attach bath mats securely with double-sided non-slip rug tape. Do not have throw rugs and other things on the floor that can make you trip. What can I do in the bedroom? Use night lights. Make sure that you have a light by your bed that is easy to reach. Do not use any sheets or blankets that are too big for your bed. They should not hang down onto the floor. Have a firm chair that has side arms. You can use this for support while you get dressed. Do not have throw rugs and other things on the floor that can make you trip. What can I do in the kitchen? Clean up any spills right away. Avoid walking on wet floors. Keep items that you use a lot in easy-to-reach places. If you need to reach something above you, use a strong step stool that has a grab bar. Keep electrical cords out of the way. Do not use floor polish or wax that makes floors slippery. If you must use wax, use non-skid floor wax. Do not have throw rugs and other things on the floor that can make you trip. What can I do with my stairs? Do not leave any items on the stairs. Make sure that there are handrails on both sides of the stairs and use them. Fix handrails that are broken or loose. Make sure  that handrails are as long as the stairways. Check any carpeting to make sure that it is firmly attached to the stairs. Fix any carpet that is loose or worn. Avoid having throw rugs at the top or bottom of the stairs. If you do have throw rugs, attach them to the floor with carpet tape. Make sure that you have a light switch at the top of the stairs and the bottom of the stairs. If you do not have them, ask someone to add them for you. What else can I do to help prevent falls? Wear shoes that: Do not have high heels. Have rubber bottoms. Are comfortable and fit you well. Are closed at the toe. Do not wear sandals. If you use a stepladder: Make sure that it is fully opened.  Do not climb a closed stepladder. Make sure that both sides of the stepladder are locked into place. Ask someone to hold it for you, if possible. Clearly mark and make sure that you can see: Any grab bars or handrails. First and last steps. Where the edge of each step is. Use tools that help you move around (mobility aids) if they are needed. These include: Canes. Walkers. Scooters. Crutches. Turn on the lights when you go into a dark area. Replace any light bulbs as soon as they burn out. Set up your furniture so you have a clear path. Avoid moving your furniture around. If any of your floors are uneven, fix them. If there are any pets around you, be aware of where they are. Review your medicines with your doctor. Some medicines can make you feel dizzy. This can increase your chance of falling. Ask your doctor what other things that you can do to help prevent falls. This information is not intended to replace advice given to you by your health care provider. Make sure you discuss any questions you have with your health care provider. Document Released: 02/20/2009 Document Revised: 10/02/2015 Document Reviewed: 05/31/2014 Elsevier Interactive Patient Education  2017 [employer].

## 2020-10-23 NOTE — Progress Notes (Addendum)
Subjective:   Diana Brewer is a 87 y.o. female who presents for Medicare Annual (Subsequent) preventive examination.  Review of Systems: N/A     I connected with the patient today by telephone and verified that I am speaking with the correct person using two identifiers. Location patient: home Location nurse: work Persons participating in the telephone visit: patient, nurse.   I discussed the limitations, risks, security and privacy concerns of performing an evaluation and management service by telephone and the availability of in person appointments. I also discussed with the patient that there may be a patient responsible charge related to this service. The patient expressed understanding and verbally consented to this telephonic visit.        Cardiac Risk Factors include: advanced age (>45mn, >>30women);Other (see comment), Risk factor comments: hypercholesterolemia     Objective:    Today's Vitals   There is no height or weight on file to calculate BMI.  Advanced Directives 10/23/2020 04/07/2020  Does Patient Have a Medical Advance Directive? No No  Would patient like information on creating a medical advance directive? No - Patient declined No - Patient declined    Current Medications (verified) Outpatient Encounter Medications as of 10/23/2020  Medication Sig   acetaminophen (TYLENOL) 500 MG tablet Take 1 tablet (500 mg total) by mouth in the morning, at noon, and at bedtime.   aspirin EC 81 MG tablet Take 1 tablet (81 mg total) by mouth daily. Swallow whole.   calcitonin, salmon, (MIACALCIN/FORTICAL) 200 UNIT/ACT nasal spray PLACE 1 SPRAY INTO ALTERNATE NOSTRILS DAILY.   Cholecalciferol (VITAMIN D3) 25 MCG (1000 UT) CAPS Take 1 capsule (1,000 Units total) by mouth daily.   diltiazem (CARDIZEM CD) 180 MG 24 hr capsule TAKE 1 CAPSULE BY MOUTH EVERY DAY   ezetimibe (ZETIA) 10 MG tablet Take 1 tablet (10 mg total) by mouth daily.   folic acid (FOLVITE) 1 MG tablet  Take 1 tablet (1 mg total) by mouth daily.   Multiple Vitamins-Minerals (CENTRUM SILVER) tablet Take 1 tablet by mouth daily.   propranolol (INDERAL) 40 MG tablet TAKE 1 TABLET BY MOUTH TWICE A DAY   sulfaSALAzine (AZULFIDINE) 500 MG tablet TAKE 2 TABLETS BY MOUTH TWICE A DAY   Wheat Dextrin (BENEFIBER PO) 1 tablespoon daily   mesalamine (ROWASA) 4 g enema PLACE 60 MLS (4 G TOTAL) RECTALLY AT BEDTIME.   No facility-administered encounter medications on file as of 10/23/2020.    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 PSharlett Iles   Past Surgical History:  Procedure Laterality Date   BREAST BIOPSY  1975   COLONOSCOPY  2011   [facility]   TRACHEOSTOMY  1942   due to Strep Throat (?epiglottitis)   Family History  Problem Relation Age of Onset   Hypertension Mother    Stroke Mother    Heart disease Mother    Rheum arthritis Father    Melanoma Father    Melanoma Brother    Lung cancer Paternal Grandfather    Colon  cancer Neg Hx    Esophageal cancer Neg Hx    Pancreatic cancer Neg Hx    Stomach cancer Neg Hx    Social History   Socioeconomic History   Marital status: Married    Spouse name: Not on file   Number of children: Not on file   Years of education: Not on file   Highest education level: Not on file  Occupational History   Not on file  Tobacco Use   Smoking status: Never   Smokeless tobacco: Never  Vaping Use   Vaping Use: Never used  Substance and Sexual Activity   Alcohol use: No   Drug use: No   Sexual activity: Never  Other  Topics Concern   Not on file  Social History Narrative   Lives with husband [name])   Balta child (daughter)   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[name]   Activity: walks and active in yard, grows vegetables   Diet: good water, good fruits/vegetables daily   Social Determinants of Radio broadcast assistant Strain: Low Risk    Difficulty of Paying Living Expenses: Not hard at all  Food Insecurity: No Food Insecurity   Worried About Charity fundraiser in the Last Year: Never true   Arboriculturist in the Last Year: Never true  Transportation Needs: No Transportation Needs   Lack of Transportation (Medical): No   Lack of Transportation (Non-Medical): No  Physical Activity: Inactive   Days of Exercise per Week: 0 days   Minutes of Exercise per Session: 0 min  Stress: No Stress Concern Present   Feeling of Stress : Not at all  Social Connections: Not on file    Tobacco Counseling Counseling given: Not Answered   Clinical Intake:  Pre-visit preparation completed: Yes  Pain : 0-10 Pain Type: Chronic pain Pain Location: Back Pain Descriptors / Indicators: Aching Pain Onset: More than a month ago Pain Frequency: Intermittent     Nutritional Risks: None Diabetes: No  How often do you need to have someone help you when you read instructions, pamphlets, or other written materials from your doctor or pharmacy?: 1 - Never  Diabetic: no Nutrition Risk Assessment:  Has the patient had any N/V/D within the last 2 months?  No  Does the patient have any non-healing wounds?  No  Has the patient had any unintentional weight loss or weight gain?  No   Diabetes:  Is the patient diabetic?  No  If diabetic, was a CBG obtained today?   N/A Did the patient bring in their glucometer from home?   N/A How often do you monitor your CBG's? N/A.   Financial Strains and Diabetes Management:  Are you having any financial strains with the device, your supplies or your  medication?  N/A .  Does the patient want to be seen by Chronic Care Management for management of their diabetes?   N/A Would the patient like to be referred to a Nutritionist or for Diabetic Management?   N/A    Interpreter Needed?: No  Information entered by :: CJohnson, LPN   Activities of Daily Living In your present state of health, do you have any difficulty performing the following activities: 10/23/2020  Hearing? N  Vision? N  Difficulty concentrating or making decisions? N  Walking or climbing stairs? N  Dressing or bathing? N  Doing errands, shopping? N  Preparing Food and eating ? N  Using the Toilet? N  In  the past six months, have you accidently leaked urine? N  Do you have problems with loss of bowel control? N  Managing your Medications? N  Managing your Finances? N  Housekeeping or managing your Housekeeping? N  Some recent data might be hidden    Patient Care Team: [name], MD as PCP - General (Family Medicine) Troy Sine, MD as PCP - Cardiology (Cardiology)  Indicate any recent Medical Services you may have received from other than [facility] providers in the past year (date may be approximate).     Assessment:   This is a routine wellness examination for Dessiree.  Hearing/Vision screen Vision Screening - Comments:: Patient gets annual eye exams   Dietary issues and exercise activities discussed: Current Exercise Habits: The patient does not participate in regular exercise at present, Exercise limited by: orthopedic condition(s) (back pain)   Goals Addressed             This Visit's Progress    Patient Stated       10/23/2020, I will maintain and continue medications as prescribed.         Depression Screen PHQ 2/9 Scores 10/23/2020 04/28/2016  PHQ - 2 Score 6 0  PHQ- 9 Score 6 -    Fall Risk Fall Risk  10/23/2020 04/28/2016  Falls in the past year? 0 Yes  Number falls in past yr: 0 1  Comment - Caught foot in pants and  tripped  Injury with Fall? 0 No  Risk for fall due to : Medication side effect -  Follow up Falls evaluation completed;Falls prevention discussed -    FALL RISK PREVENTION PERTAINING TO THE HOME:  Any stairs in or around the home? Yes  If so, are there any without handrails? No  Home free of loose throw rugs in walkways, pet beds, electrical cords, etc? Yes  Adequate lighting in your home to reduce risk of falls? Yes   ASSISTIVE DEVICES UTILIZED TO PREVENT FALLS:  Life alert? No  Use of a cane, walker or w/c? No  Grab bars in the bathroom? No  Shower chair or bench in shower? No  Elevated toilet seat or a handicapped toilet? No   TIMED UP AND GO:  Was the test performed?  N/A telephone visit  .    Cognitive Function: MMSE - Mini Mental State Exam 10/23/2020  Not completed: Refused       Mini Cog  Mini-Cog screen was not completed. Patient refused. Maximum score is 22. A value of 0 denotes this part of the MMSE was not completed or the patient failed this part of the Mini-Cog screening.  Immunizations  There is no immunization history on file for this patient.  TDAP status: Due, Education has been provided regarding the importance of this vaccine. Advised may receive this vaccine at local pharmacy or Health Dept. Aware to provide a copy of the vaccination record if obtained from local pharmacy or Health Dept. Verbalized acceptance and understanding.  Flu Vaccine status: due Fall 2022  Pneumococcal vaccine status: Declined,  Education has been provided regarding the importance of this vaccine but patient still declined. Advised may receive this vaccine at local pharmacy or Health Dept. Aware to provide a copy of the vaccination record if obtained from local pharmacy or Health Dept. Verbalized acceptance and understanding.   Covid-19 vaccine status: Declined, Education has been provided regarding the importance of this vaccine but patient still declined. Advised may receive  this vaccine at local pharmacy or  Health Dept.or vaccine clinic. Aware to provide a copy of the vaccination record if obtained from local pharmacy or Health Dept. Verbalized acceptance and understanding.  Qualifies for Shingles Vaccine? Yes   Zostavax completed No   Shingrix Completed?: No.    Education has been provided regarding the importance of this vaccine. Patient has been advised to call insurance company to determine out of pocket expense if they have not yet received this vaccine. Advised may also receive vaccine at local pharmacy or Health Dept. Verbalized acceptance and understanding.  Screening Tests Health Maintenance  Topic Date Due   DEXA SCAN  Never done   COVID-19 Vaccine (1) 11/08/2020 (Originally 03/08/1939)   Zoster Vaccines- Shingrix (1 of 2) 01/23/2021 (Originally 03/09/1953)   PNA vac Low Risk Adult (1 of 2 - PCV13) 10/23/2021 (Originally 03/08/2001)   TETANUS/TDAP  10/24/2023 (Originally 03/09/1953)   INFLUENZA VACCINE  12/08/2020   HPV VACCINES  Aged Out    Health Maintenance  Health Maintenance Due  Topic Date Due   DEXA SCAN  Never done    Colorectal cancer screening: No longer required.   Mammogram status: No longer required due to age.  Bone Density status: due, will discuss with provider at next visit   Lung Cancer Screening: (Low Dose CT Chest recommended if Age 59-80 years, 30 pack-year currently smoking OR have quit w/in 15years.) does not qualify.   Additional Screening:  Hepatitis C Screening: does not qualify; Completed N/A  Vision Screening: Recommended annual ophthalmology exams for early detection of glaucoma and other disorders of the eye. Is the patient up to date with their annual eye exam?  Yes  Who is the provider or what is the name of the office in which the patient attends annual eye exams? Dr. Delman Cheadle If pt is not established with a provider, would they like to be referred to a provider to establish care? No .   Dental Screening:  Recommended annual dental exams for proper oral hygiene  Community Resource Referral / Chronic Care Management: CRR required this visit?  No   CCM required this visit?  No      Plan:     I have personally reviewed and noted the following in the patient's chart:   Medical and social history Use of alcohol, tobacco or illicit drugs  Current medications and supplements including opioid prescriptions.  Functional ability and status Nutritional status Physical activity Advanced directives List of other physicians Hospitalizations, surgeries, and ER visits in previous 12 months Vitals Screenings to include cognitive, depression, and falls Referrals and appointments  In addition, I have reviewed and discussed with patient certain preventive protocols, quality metrics, and best practice recommendations. A written personalized care plan for preventive services as well as general preventive health recommendations were provided to patient.   Due to this being a telephonic visit, the after visit summary with patients personalized plan was offered to patient via office or my-chart. Patient preferred to pick up at office at next visit or via mychart.   Andrez Grime, LPN   [id number]

## 2020-10-23 NOTE — Telephone Encounter (Signed)
Dr Darnell Level, I wanted to follow up on Prolia instructions for this patient. Based on her lab work from 10/15/20 does she need to proceed with injection?  Creatinine Clearance is low at 28.53 mL/min.  Please review when able. Thank you.

## 2020-10-23 NOTE — Progress Notes (Addendum)
PCP notes:  Health Maintenance: Declined all vaccines Dexa-due   Abnormal Screenings: none   Patient concerns: none   Nurse concerns: none   Next PCP appt.: none

## 2020-10-24 LAB — VITAMIN D 1,25 DIHYDROXY
Vitamin D 1, 25 (OH)2 Total: <8 pg/mL — ABNORMAL LOW (ref 18–72)
Vitamin D2 1, 25 (OH)2: <8 pg/mL
Vitamin D3 1, 25 (OH)2: <8 pg/mL

## 2020-10-24 LAB — PARATHYROID HORMONE, INTACT (NO CA): PTH: 10 pg/mL — ABNORMAL LOW (ref 16–77)

## 2020-10-24 LAB — TEST AUTHORIZATION

## 2020-10-26 ENCOUNTER — Other Ambulatory Visit: Payer: Self-pay | Admitting: Family Medicine

## 2020-10-27 ENCOUNTER — Other Ambulatory Visit: Payer: Self-pay | Admitting: Family Medicine

## 2020-10-27 DIAGNOSIS — N1832 Chronic kidney disease, stage 3b: Secondary | ICD-10-CM

## 2020-10-27 NOTE — Telephone Encounter (Signed)
Calcitonin-salmon Last filled:  09/29/20, #3.7 mL Last OV:  09/12/20, 6 wk compression fx f/u Next OV:  none

## 2020-10-27 NOTE — Telephone Encounter (Signed)
Mailed rx to pt on 09/24/20.

## 2020-10-29 NOTE — Telephone Encounter (Signed)
Received refill request - is pt using nasal calcitonin?

## 2020-10-29 NOTE — Telephone Encounter (Signed)
Spoke with pt asking about nasal spray.  States she no longer uses it and doesn't want it filled.   Denied request. FYI to Dr. Darnell Level.

## 2020-10-30 ENCOUNTER — Other Ambulatory Visit: Payer: Self-pay | Admitting: Internal Medicine

## 2020-11-04 ENCOUNTER — Other Ambulatory Visit: Payer: Self-pay

## 2020-11-04 ENCOUNTER — Other Ambulatory Visit (INDEPENDENT_AMBULATORY_CARE_PROVIDER_SITE_OTHER): Payer: Medicare Other

## 2020-11-04 DIAGNOSIS — N1832 Chronic kidney disease, stage 3b: Secondary | ICD-10-CM | POA: Diagnosis not present

## 2020-11-04 LAB — RENAL FUNCTION PANEL
Albumin: 4.1 g/dL (ref 3.5–5.2)
BUN: 22 mg/dL (ref 6–23)
CO2: 27 mEq/L (ref 19–32)
Calcium: 10.8 mg/dL — ABNORMAL HIGH (ref 8.4–10.5)
Chloride: 104 mEq/L (ref 96–112)
Creatinine, Ser: 1.35 mg/dL — ABNORMAL HIGH (ref 0.40–1.20)
GFR: 36.05 mL/min — ABNORMAL LOW (ref >60.00)
Glucose, Bld: 93 mg/dL (ref 70–99)
Phosphorus: 3.4 mg/dL (ref 2.3–4.6)
Potassium: 4.4 mEq/L (ref 3.5–5.1)
Sodium: 140 mEq/L (ref 135–145)

## 2020-11-05 LAB — KAPPA/LAMBDA LIGHT CHAINS
Kappa free light chain: 3813 mg/L — ABNORMAL HIGH (ref 3.3–19.4)
Kappa:Lambda Ratio: 302.62 — ABNORMAL HIGH (ref 0.26–1.65)
Lambda Free Lght Chn: 12.6 mg/L (ref 5.7–26.3)

## 2020-11-11 DIAGNOSIS — S22000A Wedge compression fracture of unspecified thoracic vertebra, initial encounter for closed fracture: Secondary | ICD-10-CM | POA: Diagnosis not present

## 2020-11-11 DIAGNOSIS — Z8781 Personal history of (healed) traumatic fracture: Secondary | ICD-10-CM | POA: Diagnosis not present

## 2020-11-11 LAB — PROTEIN ELECTROPHORESIS, SERUM, WITH REFLEX
Albumin ELP: 3.8 g/dL (ref 3.8–4.8)
Alpha 1: 0.4 g/dL — ABNORMAL HIGH (ref 0.2–0.3)
Alpha 2: 0.7 g/dL (ref 0.5–0.9)
Beta 2: 0.3 g/dL (ref 0.2–0.5)
Beta Globulin: 0.4 g/dL (ref 0.4–0.6)
Gamma Globulin: 0.5 g/dL — ABNORMAL LOW (ref 0.8–1.7)
Total Protein: 6.1 g/dL (ref 6.1–8.1)

## 2020-11-11 LAB — IFE INTERPRETATION

## 2020-11-12 ENCOUNTER — Other Ambulatory Visit: Payer: Self-pay | Admitting: Family Medicine

## 2020-11-12 DIAGNOSIS — N183 Chronic kidney disease, stage 3 unspecified: Secondary | ICD-10-CM

## 2020-11-12 DIAGNOSIS — M8000XD Age-related osteoporosis with current pathological fracture, unspecified site, subsequent encounter for fracture with routine healing: Secondary | ICD-10-CM

## 2020-11-12 DIAGNOSIS — D8989 Other specified disorders involving the immune mechanism, not elsewhere classified: Secondary | ICD-10-CM

## 2020-11-14 NOTE — Telephone Encounter (Signed)
Dr Darnell Level, based on patient's lab results is patient ok to proceed with prolia injection at this time? I am not sure if she decided to or not but wanted to follow up with you on this before contacting patient again. Thank you

## 2020-11-25 ENCOUNTER — Inpatient Hospital Stay: Payer: Medicare Other | Attending: Oncology | Admitting: Oncology

## 2020-11-25 ENCOUNTER — Inpatient Hospital Stay: Payer: Medicare Other

## 2020-11-25 DIAGNOSIS — D649 Anemia, unspecified: Secondary | ICD-10-CM | POA: Insufficient documentation

## 2020-11-25 DIAGNOSIS — C9000 Multiple myeloma not having achieved remission: Secondary | ICD-10-CM | POA: Insufficient documentation

## 2020-11-26 ENCOUNTER — Telehealth: Payer: Self-pay | Admitting: Oncology

## 2020-11-26 NOTE — Telephone Encounter (Signed)
Spoke with patient about her request to make her New Patient appointment a virtual visit. I explained to the patient that in order to provide her with adequate care she must physically come into the office in order to be evaluated. Patient stated multiple times that she is in "terrible pain" and does not think she can leave her home. She stated that she would speak with her daughter and husband about what next steps should be and that she would call the office once they have made a decision.

## 2020-11-27 ENCOUNTER — Inpatient Hospital Stay: Payer: Medicare Other

## 2020-11-27 ENCOUNTER — Inpatient Hospital Stay: Payer: Medicare Other | Admitting: Oncology

## 2020-12-04 ENCOUNTER — Telehealth: Payer: Self-pay | Admitting: Oncology

## 2020-12-04 NOTE — Telephone Encounter (Signed)
Patient called and states she no longer needs transportation assistance.  Diana Brewer patient appointment canceled and appointment note updated.  Patient confirmed appointment with Dr. Janese Banks.

## 2020-12-05 ENCOUNTER — Inpatient Hospital Stay: Payer: Medicare Other | Admitting: Oncology

## 2020-12-05 ENCOUNTER — Encounter (INDEPENDENT_AMBULATORY_CARE_PROVIDER_SITE_OTHER): Payer: Self-pay

## 2020-12-05 ENCOUNTER — Other Ambulatory Visit: Payer: Self-pay

## 2020-12-05 ENCOUNTER — Inpatient Hospital Stay: Payer: Medicare Other

## 2020-12-05 ENCOUNTER — Encounter: Payer: Self-pay | Admitting: Oncology

## 2020-12-05 VITALS — BP 125/59 | HR 79 | Temp 97.9°F | Resp 20

## 2020-12-05 DIAGNOSIS — D649 Anemia, unspecified: Secondary | ICD-10-CM

## 2020-12-05 DIAGNOSIS — Z7189 Other specified counseling: Secondary | ICD-10-CM

## 2020-12-05 DIAGNOSIS — C9000 Multiple myeloma not having achieved remission: Secondary | ICD-10-CM

## 2020-12-05 LAB — CBC WITH DIFFERENTIAL/PLATELET
Abs Immature Granulocytes: 0.07 10*3/uL (ref 0.00–0.07)
Basophils Absolute: 0.1 10*3/uL (ref 0.0–0.1)
Basophils Relative: 1 %
Eosinophils Absolute: 0.4 10*3/uL (ref 0.0–0.5)
Eosinophils Relative: 4 %
HCT: 33.2 % — ABNORMAL LOW (ref 36.0–46.0)
Hemoglobin: 11.2 g/dL — ABNORMAL LOW (ref 12.0–15.0)
Immature Granulocytes: 1 %
Lymphocytes Relative: 17 %
Lymphs Abs: 1.7 10*3/uL (ref 0.7–4.0)
MCH: 34.1 pg — ABNORMAL HIGH (ref 26.0–34.0)
MCHC: 33.7 g/dL (ref 30.0–36.0)
MCV: 101.2 fL — ABNORMAL HIGH (ref 80.0–100.0)
Monocytes Absolute: 1.2 10*3/uL — ABNORMAL HIGH (ref 0.1–1.0)
Monocytes Relative: 12 %
Neutro Abs: 6.7 10*3/uL (ref 1.7–7.7)
Neutrophils Relative %: 65 %
Platelets: 605 10*3/uL — ABNORMAL HIGH (ref 150–400)
RBC: 3.28 MIL/uL — ABNORMAL LOW (ref 3.87–5.11)
RDW: 13.8 % (ref 11.5–15.5)
WBC: 10.2 10*3/uL (ref 4.0–10.5)
nRBC: 0 % (ref 0.0–0.2)

## 2020-12-05 LAB — COMPREHENSIVE METABOLIC PANEL
ALT: 14 U/L (ref 0–44)
AST: 18 U/L (ref 15–41)
Albumin: 3.9 g/dL (ref 3.5–5.0)
Alkaline Phosphatase: 111 U/L (ref 38–126)
Anion gap: 7 (ref 5–15)
BUN: 22 mg/dL (ref 8–23)
CO2: 30 mmol/L (ref 22–32)
Calcium: 10.8 mg/dL — ABNORMAL HIGH (ref 8.9–10.3)
Chloride: 105 mmol/L (ref 98–111)
Creatinine, Ser: 1.37 mg/dL — ABNORMAL HIGH (ref 0.44–1.00)
GFR, Estimated: 38 mL/min — ABNORMAL LOW (ref >60)
Glucose, Bld: 116 mg/dL — ABNORMAL HIGH (ref 70–99)
Potassium: 4.2 mmol/L (ref 3.5–5.1)
Sodium: 142 mmol/L (ref 135–145)
Total Bilirubin: 0.7 mg/dL (ref 0.3–1.2)
Total Protein: 5.9 g/dL — ABNORMAL LOW (ref 6.5–8.1)

## 2020-12-05 LAB — LACTATE DEHYDROGENASE: LDH: 164 U/L (ref 98–192)

## 2020-12-05 MED ORDER — DEXAMETHASONE 4 MG PO TABS: 20.0000 mg | ORAL_TABLET | Freq: Every day | ORAL | 0 refills | Status: DC

## 2020-12-06 ENCOUNTER — Encounter: Payer: Self-pay | Admitting: Oncology

## 2020-12-06 LAB — BETA 2 MICROGLOBULIN, SERUM: Beta-2 Microglobulin: 5.8 mg/L — ABNORMAL HIGH (ref 0.6–2.4)

## 2020-12-06 NOTE — Progress Notes (Signed)
Hematology/Oncology Consult note [facility] [phone] [phone]  Patient Care Team: [name], MD as PCP - General (Family Medicine) Troy Sine, MD as PCP - Cardiology (Cardiology)   Name of the patient: Diana Brewer  [id number]  1934/04/10    Reason for referral-referral for elevated free light chains   Referring physician-Dr. Danise Mina  Date of visit: 12/06/20   History of presenting illness- patient is a 87 year old female who has been having ongoing back pain for the last 1 year.  She underwent x-ray of her thoracic and lumbar spine which showed T9 inferior endplate fracture as well as remote T5 T7 and T11 fractures.  She was seen by Dr. Lacinda Axon from neurosurgery and was recommended medical management for her osteoporosis.  Her mobility has been affected because of her back pain.  She is mostly sedentary and will walk for short distances.  She had some work-up done by Dr. Danise Mina in June 2022 which showed elevated kappa free light chain of 3813 and ratio of 302.  Myeloma panel showed no M protein.  Patient is here with her husband today.  She reports ongoing back pain for which she mostly uses Tylenol.  She is hesitant to try anything stronger.  Appetite is fair and she was unable to stand up for [place] to get weighed.   ECOG PS- 3  Pain scale- 6   Review of systems- Review of Systems  Constitutional:  Positive for malaise/fatigue. Negative for chills, fever and weight loss.  HENT:  Negative for congestion, ear discharge and nosebleeds.   Eyes:  Negative for blurred vision.  Respiratory:  Negative for cough, hemoptysis, sputum production, shortness of breath and wheezing.   Cardiovascular:  Negative for chest pain, palpitations, orthopnea and claudication.  Gastrointestinal:  Negative for abdominal pain, blood in stool, constipation, diarrhea, heartburn, melena, nausea and vomiting.  Genitourinary:  Negative for  dysuria, flank pain, frequency, hematuria and urgency.  Musculoskeletal:  Positive for back pain. Negative for joint pain and myalgias.  Skin:  Negative for rash.  Neurological:  Negative for dizziness, tingling, focal weakness, seizures, weakness and headaches.  Endo/Heme/Allergies:  Does not bruise/bleed easily.  Psychiatric/Behavioral:  Negative for depression and suicidal ideas. The patient does not have insomnia.    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   Hypercalcemia 10/19/2020   Osteoporosis 09/16/2020   Acquired hypothyroidism 09/16/2020   New onset atrial fibrillation ([place]) 04/08/2020   Acute midline thoracic back pain 12/10/2019   Closed compression fracture of thoracic vertebra ([place]) 12/10/2019   CKD (chronic kidney disease) stage 3, GFR 30-59 ml/min (HCC) 11/26/2019   Macrocytic anemia 11/26/2019   History of COVID-19 03/2019   Medicare annual wellness visit, initial 04/28/2016   Advanced care planning/counseling discussion 04/28/2016   Wrist arthritis 04/28/2016   Abnormal LFTs (liver function tests) 02/26/2013   Benign hypertensive heart disease without heart failure 12/27/2012   Rapid palpitations 12/24/2010   Hypercholesterolemia 12/24/2010   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)     Past Surgical History:  Procedure Laterality Date   BREAST BIOPSY  1975   COLONOSCOPY  2011   [facility]   TRACHEOSTOMY  1942   due to Strep Throat (?epiglottitis)    Social History   Socioeconomic History   Marital status: Married    Spouse name: Not on file   Number of children: Not on file   Years of education: Not on file   Highest education level: Not on file  Occupational History   Not on file  Tobacco Use   Smoking status: Never   Smokeless tobacco: Never  Vaping Use   Vaping Use: Never used  Substance and Sexual Activity   Alcohol use: No   Drug use: No   Sexual activity: Never  Other Topics Concern   Not on file  Social History Narrative   Lives with husband [name])   [place] child (daughter)   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[name]   Activity: walks and active in yard, grows vegetables   Diet: good water, good fruits/vegetables daily   Social Determinants of Radio broadcast assistant Strain: Low Risk    Difficulty of Paying Living Expenses: Not hard at all  Food Insecurity: No Food Insecurity   Worried About Charity fundraiser in the Last Year: Never true   Arboriculturist in the Last Year: Never true  Transportation Needs: No Transportation Needs   Lack of Transportation (Medical): No   Lack of Transportation (Non-Medical): No  Physical Activity: Inactive   Days of Exercise per Week: 0 days   Minutes of Exercise per Session: 0 min  Stress: No Stress Concern Present   Feeling of Stress : Not at all  Social Connections: Not on file  Intimate Partner Violence: Not At Risk   Fear of Current or Ex-Partner: No   Emotionally Abused: No   Physically Abused: No   Sexually Abused: No     Family History  Problem Relation Age of Onset   Hypertension Mother    Stroke Mother    Heart disease Mother    Rheum arthritis Father    Melanoma Father    Melanoma Brother    Lung cancer Paternal  Grandfather    Colon cancer Neg Hx    Esophageal cancer Neg Hx    Pancreatic cancer Neg Hx    Stomach cancer Neg Hx      Current Outpatient Medications:    acetaminophen (TYLENOL) 500 MG tablet, Take 1 tablet (500 mg total) by mouth in the morning, at noon, and at bedtime., Disp: , Rfl:    aspirin EC 81 MG tablet, Take 1 tablet (81 mg total) by mouth daily. Swallow whole., Disp: 30 tablet, Rfl: 11   Cholecalciferol (VITAMIN D3) 25 MCG (1000 UT) CAPS, Take 1 capsule (1,000 Units total) by mouth daily., Disp: 30 capsule, Rfl:    dexamethasone (DECADRON) 4 MG tablet, Take 5 tablets (20 mg total) by mouth daily. Take daily in am after eating food each day for 5 days. Please start medication on Monday 12/08/2020, Disp: 25 tablet, Rfl: 0  diltiazem (CARDIZEM CD) 180 MG 24 hr capsule, TAKE 1 CAPSULE BY MOUTH EVERY DAY, Disp: 90 capsule, Rfl: 3   ezetimibe (ZETIA) 10 MG tablet, Take 1 tablet (10 mg total) by mouth daily., Disp: 90 tablet, Rfl: 3   folic acid (FOLVITE) 1 MG tablet, Take 1 tablet (1 mg total) by mouth daily., Disp: , Rfl:    Multiple Vitamins-Minerals (CENTRUM SILVER) tablet, Take 1 tablet by mouth daily., Disp: , Rfl:    propranolol (INDERAL) 40 MG tablet, TAKE 1 TABLET BY MOUTH TWICE A DAY, Disp: 180 tablet, Rfl: 3   sulfaSALAzine (AZULFIDINE) 500 MG tablet, TAKE 2 TABLETS BY MOUTH TWICE A DAY. NEEDS OFFICE VISIT FOR FURTHER REFILLS, Disp: 120 tablet, Rfl: 0   Wheat Dextrin (BENEFIBER PO), 1 tablespoon daily, Disp: , Rfl:    mesalamine (ROWASA) 4 g enema, PLACE 60 MLS (4 G TOTAL) RECTALLY AT BEDTIME., Disp: 1800 mL, Rfl: 2   Physical exam:  Vitals:   12/05/20 1528  BP: (!) 125/59  Pulse: 79  Resp: 20  Temp: 97.9 F (36.6 C)  TempSrc: Tympanic  SpO2: 97%   Physical Exam Constitutional:      Comments: Elderly frail woman sitting in a wheelchair.  Appears fatigued  Cardiovascular:     Rate and Rhythm: Normal rate and regular rhythm.     Heart sounds: Normal heart sounds.   Pulmonary:     Effort: Pulmonary effort is normal.     Breath sounds: Normal breath sounds.  Abdominal:     General: Bowel sounds are normal.     Palpations: Abdomen is soft.  Musculoskeletal:     Right lower leg: No edema.     Left lower leg: No edema.  Skin:    General: Skin is warm and dry.  Neurological:     Mental Status: She is alert and oriented to person, place, and time.       CMP Latest Ref Rng & Units 12/05/2020  Glucose 70 - 99 mg/dL 116(H)  BUN 8 - 23 mg/dL 22  Creatinine 0.44 - 1.00 mg/dL 1.37(H)  Sodium 135 - 145 mmol/L 142  Potassium 3.5 - 5.1 mmol/L 4.2  Chloride 98 - 111 mmol/L 105  CO2 22 - 32 mmol/L 30  Calcium 8.9 - 10.3 mg/dL 10.8(H)  Total Protein 6.5 - 8.1 g/dL 5.9(L)  Total Bilirubin 0.3 - 1.2 mg/dL 0.7  Alkaline Phos 38 - 126 U/L 111  AST 15 - 41 U/L 18  ALT 0 - 44 U/L 14   CBC Latest Ref Rng & Units 12/05/2020  WBC 4.0 - 10.5 K/uL 10.2  Hemoglobin 12.0 - 15.0 g/dL 11.2(L)  Hematocrit 36.0 - 46.0 % 33.2(L)  Platelets 150 - 400 K/uL 605(H)    Assessment and plan- Patient is a 87 y.o. female referred for elevated free light chains  Patient was found to have an elevated serum free kappa light chain level of 3813 with a kappa lambda light chain ratio of 302 which is diagnostic of multiple myeloma.  She did not have any M protein on her SPEP so she could very well have light chain myeloma.  She does have mild anemia with a hemoglobin that has remained stable around 11 for the last 1 year and evidence of macrocytosis which can be seen with myeloma.  Some element of renal dysfunction was noted with a creatinine of 1.5 which is also remained stable over the last 1 year and serum calcium levels are elevated at 11.3.  Although  her compression fractures could be secondary to osteoporosis, given that she has multiple myeloma the role of multiple myeloma towards her compression fractures cannot be eliminated.  Discussed with the patient that she would need a  complete work-up at this time including a PET scan and a bone marrow biopsy.  Discussed that myeloma is not curable but treatable.  She is elderly and frail and I would recommend starting off with Revlimid 15 mg 3 weeks on 1 week off along with Decadron 20 mg weekly if she can tolerate it.  We will need to work on Biochemist, clinical for her Revlimid.  In the meanwhile I will start her on pulse dose Decadron but given her age give her a lower dose of 20 mg for 5 days.  We also discussed pain management for her back pain including trying as needed oxycodone but patient remains hesitant to do that.  I will give her a prescription for 5 tablets and she can take a tablet before she goes for her PET scan.  It is difficult for the patient to come in person for multiple appointments given her ongoing back pain and I will therefore see her after bone marrow biopsy and PET scan results are back.  Patient is still undecided if she wants to pursue a bone marrow biopsy or not.  We discussed that her back pain can improve significantly with myeloma treatment.  I will reach out to her after PET scan results are back.  Labs today including CBC with differential, CMP, myeloma panel serum free light chains LDH and beta-2 microglobulin   Thank you for this kind referral and the opportunity to participate in the care of this  Patient   Visit Diagnosis 1. Multiple myeloma not having achieved remission ([place])     Dr. Randa Evens, MD, MPH [facility] [phone] 12/06/2020

## 2020-12-07 DIAGNOSIS — C9000 Multiple myeloma not having achieved remission: Secondary | ICD-10-CM | POA: Diagnosis not present

## 2020-12-08 ENCOUNTER — Other Ambulatory Visit: Payer: Self-pay

## 2020-12-08 DIAGNOSIS — C9000 Multiple myeloma not having achieved remission: Secondary | ICD-10-CM

## 2020-12-08 LAB — KAPPA/LAMBDA LIGHT CHAINS
Kappa free light chain: 5618.1 mg/L — ABNORMAL HIGH (ref 3.3–19.4)
Kappa, lambda light chain ratio: 749.08 — ABNORMAL HIGH (ref 0.26–1.65)
Lambda free light chains: 7.5 mg/L (ref 5.7–26.3)

## 2020-12-09 ENCOUNTER — Ambulatory Visit: Payer: Medicare Other

## 2020-12-09 LAB — MULTIPLE MYELOMA PANEL, SERUM
Albumin SerPl Elph-Mcnc: 3.7 g/dL (ref 2.9–4.4)
Albumin/Glob SerPl: 1.7 (ref 0.7–1.7)
Alpha 1: 0.3 g/dL (ref 0.0–0.4)
Alpha2 Glob SerPl Elph-Mcnc: 0.6 g/dL (ref 0.4–1.0)
B-Globulin SerPl Elph-Mcnc: 1 g/dL (ref 0.7–1.3)
Gamma Glob SerPl Elph-Mcnc: 0.4 g/dL (ref 0.4–1.8)
Globulin, Total: 2.2 g/dL (ref 2.2–3.9)
IgA: 53 mg/dL — ABNORMAL LOW (ref 64–422)
IgG (Immunoglobin G), Serum: 462 mg/dL — ABNORMAL LOW (ref 586–1602)
IgM (Immunoglobulin M), Srm: 7 mg/dL — ABNORMAL LOW (ref 26–217)
Total Protein ELP: 5.9 g/dL — ABNORMAL LOW (ref 6.0–8.5)

## 2020-12-12 LAB — IFE+PROTEIN ELECTRO, 24-HR UR
% BETA, Urine: 0 %
ALPHA 1 URINE: 0 %
Albumin, U: 100 %
Alpha 2, Urine: 0 %
GAMMA GLOBULIN URINE: 0 %
M-SPIKE %, Urine: 0 %
M-Spike, Mg/24 Hr: 0 mg/24 hr
Total Protein, Urine-Ur/day: 109 mg/24 hr (ref 30–150)
Total Protein, Urine: 15.6 mg/dL
Total Volume: 700

## 2020-12-14 ENCOUNTER — Other Ambulatory Visit: Payer: Self-pay | Admitting: Internal Medicine

## 2020-12-16 ENCOUNTER — Ambulatory Visit
Admission: RE | Admit: 2020-12-16 | Discharge: 2020-12-16 | Disposition: A | Discharge status: Home / Self Care | Payer: Medicare Other | Source: Ambulatory Visit | Attending: Oncology | Admitting: Oncology

## 2020-12-16 ENCOUNTER — Telehealth: Payer: Self-pay | Admitting: Pharmacy Technician

## 2020-12-16 ENCOUNTER — Other Ambulatory Visit (HOSPITAL_COMMUNITY): Payer: Self-pay

## 2020-12-16 ENCOUNTER — Other Ambulatory Visit: Payer: Self-pay | Admitting: Oncology

## 2020-12-16 ENCOUNTER — Other Ambulatory Visit: Payer: Self-pay

## 2020-12-16 DIAGNOSIS — C9000 Multiple myeloma not having achieved remission: Secondary | ICD-10-CM | POA: Insufficient documentation

## 2020-12-16 DIAGNOSIS — I517 Cardiomegaly: Secondary | ICD-10-CM | POA: Diagnosis not present

## 2020-12-16 DIAGNOSIS — I700 Atherosclerosis of aorta: Secondary | ICD-10-CM | POA: Insufficient documentation

## 2020-12-16 LAB — GLUCOSE, CAPILLARY: Glucose-Capillary: 103 mg/dL — ABNORMAL HIGH (ref 70–99)

## 2020-12-16 MED ORDER — LENALIDOMIDE 10 MG PO CAPS
10.0000 mg | ORAL_CAPSULE | Freq: Every day | ORAL | 5 refills | Status: DC
Start: 2020-12-16 — End: 2020-12-24

## 2020-12-16 MED ORDER — FLUDEOXYGLUCOSE F - 18 (FDG) INJECTION
7.6000 | Freq: Once | INTRAVENOUS | Status: AC | PRN
  Administered 2020-12-16: 7.84 via INTRAVENOUS

## 2020-12-16 NOTE — Progress Notes (Signed)
revlimid

## 2020-12-16 NOTE — Telephone Encounter (Signed)
Oral Oncology Brewer Advocate Encounter   Received notification from [facility] that prior authorization for Revlimid is required.   PA submitted on CoverMyMeds Key [id number] Status is pending   Oral Oncology Clinic will continue to follow.  Diana Brewer [place] Phone [phone] Fax [phone] 12/16/2020 9:39 AM

## 2020-12-16 NOTE — Telephone Encounter (Signed)
Oral Oncology Brewer Advocate Encounter  Prior Authorization for Revlimid has been approved.    PA# [id number] Effective dates: 12/16/20 through 12/16/21  Patients co-pay is $3702.80.  Oral Oncology Clinic will continue to follow.   Diana Brewer [place] Phone [phone] Fax [phone] 12/16/2020 9:48 AM

## 2020-12-17 ENCOUNTER — Telehealth: Payer: Self-pay | Admitting: *Deleted

## 2020-12-17 NOTE — Telephone Encounter (Signed)
Call steroids that Dr. Janese Banks had sent to the pharmacy for.  She felt like that she was not told to go pick up prescriptions.  Threw her off when the pharmacy called and told her that her prescription was ready.  Dr. Janese Banks had spoke to her late in the evening last week .  Then when the patient came into the office she was it in excruciating pain from her back.  The patient really did not remember most of the conversation.  Dr. Janese Banks had told her that she was going to send her in some steroids.  The patient really did not know steroids was called dexamethasone and she also did not remember that Dr. Janese Banks told her to start it on August 1.  Also wanted to go over the kappa lambda light chains and the myeloma panel.  Her kappa lambda light chains are doubled since they had done it at the primary doctor's office.  The myeloma panel was not abnormal as far as being high.  But I explained that when patients have myeloma sometimes it will be the kappa lambda that is elevated and not the myeloma IgG IgA and IgM.  Patient has still a lot of information and we need to put it in as easy form as we can so she can understand it better.  After talking to her for the last 45 minutes the patient is agreeable to take the dexamethasone starting tomorrow.  Also told her about flushing of the face can have hot flashes can feel like your heart rate gets a little faster.  Also told her that she needed to take it on a full stomach because if you take it over time for a long period it can cause issues with your abdomen being upset.  Patient will take the medicine when she wakes up in the mornings which is somewhere between 7 and 830 and try to eat some food.  According to patient she usually only eats 2 spoonfuls of oatmeal with raisin and it.  I asked if she could eat a little bit more than that because that would not be really a good full stomach .  She will try.  I also suggested that she can do Ensure or boost drinks that has a lot of protein in  it or if she could have some toast if she likes bacon ,eggs ,sausage , cereal.fruit.  Patient says she her best to eat more.  She also says that her daughter is off on Tuesdays and Fridays so that would be the best day to have her come over or whether it be in person or by video.  The patient did say that she had a hard time understanding what Dr. Janese Banks says when she does a telephone visit.  I explained to her that we usually do the video visits and I think that would be having to go through her daughter and then the in the clinic visits would be probably better but then its harder on the patient because she has so much back pain it is hard for her to get around.  She did agree for me to make her an appointment for 1 day next week either Tuesday or Friday and I told her that I would get that set up and let her know

## 2020-12-18 ENCOUNTER — Telehealth: Payer: Self-pay | Admitting: Pharmacist

## 2020-12-18 DIAGNOSIS — C9000 Multiple myeloma not having achieved remission: Secondary | ICD-10-CM

## 2020-12-18 NOTE — Telephone Encounter (Signed)
Oral Oncology Pharmacist Encounter  Received new prescription for Revlimid (lenalidomide) for the treatment of newly diagnosed kappa light chain multiple myeloma in conjunction with dexamethasone, planned duration until disease progression or unacceptable drug toxicity.  CMP from 12/05/20 assessed, SCr elevated at 1.61m/dL, CrCl ~27. Patient is being started on a reduced dose of lenalidomide. Prescription dose and frequency assessed.   Current medication list in Epic reviewed, a few DDIs with lenalidomide identified: - Sulfasalazine and mesalamine: Both medication may enhance the myelosuppressive effect of lenalidomide. Patient will have her CBC monitored frequently. No baseline dose adjustment needed.   Evaluated chart and no patient barriers to medication adherence identified.   Oral Oncology Clinic will continue to follow for insurance authorization, copayment issues, initial counseling and start date.   ADarl Pikes PharmD, BCPS, BCOP, CPP Hematology/Oncology Clinical Pharmacist Practitioner [phone] 12/18/2020 11:17 AM

## 2020-12-19 ENCOUNTER — Telehealth: Payer: Self-pay | Admitting: Internal Medicine

## 2020-12-19 ENCOUNTER — Other Ambulatory Visit: Payer: Self-pay | Admitting: Oncology

## 2020-12-19 ENCOUNTER — Other Ambulatory Visit: Payer: Self-pay

## 2020-12-19 DIAGNOSIS — C9000 Multiple myeloma not having achieved remission: Secondary | ICD-10-CM

## 2020-12-19 MED ORDER — SULFASALAZINE 500 MG PO TABS: ORAL_TABLET | ORAL | 1 refills | Status: DC

## 2020-12-19 NOTE — Telephone Encounter (Signed)
Patient requested refill on sulfasalazine until appointment. This has been sent. She also indicates that she "dont know when I will be able to come for an appointment." I advised that she is already scheduled for one with our PA, [name] on 01/21/21 at 2 pm and asked that she write this information down. Patient states that she was recently dx with multiple myeloma and was placed on decadron. She is concerned about taking this medication and also wants input from Dr Hilarie Fredrickson about her oncology care. I advised that although Dr Hilarie Fredrickson does look closely at her chart when she is seen, he would likely not have additional input on oncology specialist notes as not directly related to the bowel.

## 2020-12-23 ENCOUNTER — Encounter: Payer: Self-pay | Admitting: Oncology

## 2020-12-23 ENCOUNTER — Inpatient Hospital Stay: Payer: Medicare Other

## 2020-12-23 ENCOUNTER — Inpatient Hospital Stay: Payer: Medicare Other | Attending: Oncology

## 2020-12-23 ENCOUNTER — Other Ambulatory Visit: Payer: Self-pay

## 2020-12-23 ENCOUNTER — Inpatient Hospital Stay: Payer: Medicare Other | Admitting: Pharmacist

## 2020-12-23 ENCOUNTER — Inpatient Hospital Stay (HOSPITAL_BASED_OUTPATIENT_CLINIC_OR_DEPARTMENT_OTHER): Payer: Medicare Other | Admitting: Oncology

## 2020-12-23 VITALS — BP 133/67 | HR 65 | Temp 96.8°F | Resp 20 | Wt 146.5 lb

## 2020-12-23 DIAGNOSIS — C9000 Multiple myeloma not having achieved remission: Secondary | ICD-10-CM | POA: Diagnosis not present

## 2020-12-23 DIAGNOSIS — M810 Age-related osteoporosis without current pathological fracture: Secondary | ICD-10-CM | POA: Diagnosis not present

## 2020-12-23 DIAGNOSIS — Z7189 Other specified counseling: Secondary | ICD-10-CM | POA: Diagnosis not present

## 2020-12-23 LAB — COMPREHENSIVE METABOLIC PANEL
ALT: 45 U/L — ABNORMAL HIGH (ref 0–44)
AST: 35 U/L (ref 15–41)
Albumin: 3.8 g/dL (ref 3.5–5.0)
Alkaline Phosphatase: 88 U/L (ref 38–126)
Anion gap: 9 (ref 5–15)
BUN: 53 mg/dL — ABNORMAL HIGH (ref 8–23)
CO2: 22 mmol/L (ref 22–32)
Calcium: 9.2 mg/dL (ref 8.9–10.3)
Chloride: 105 mmol/L (ref 98–111)
Creatinine, Ser: 1.25 mg/dL — ABNORMAL HIGH (ref 0.44–1.00)
GFR, Estimated: 43 mL/min — ABNORMAL LOW (ref >60)
Glucose, Bld: 135 mg/dL — ABNORMAL HIGH (ref 70–99)
Potassium: 4.4 mmol/L (ref 3.5–5.1)
Sodium: 136 mmol/L (ref 135–145)
Total Bilirubin: 0.7 mg/dL (ref 0.3–1.2)
Total Protein: 3.8 g/dL — ABNORMAL LOW (ref 6.5–8.1)

## 2020-12-23 LAB — CBC WITH DIFFERENTIAL/PLATELET
Abs Immature Granulocytes: 0.17 10*3/uL — ABNORMAL HIGH (ref 0.00–0.07)
Basophils Absolute: 0 10*3/uL (ref 0.0–0.1)
Basophils Relative: 0 %
Eosinophils Absolute: 0 10*3/uL (ref 0.0–0.5)
Eosinophils Relative: 0 %
HCT: 33.7 % — ABNORMAL LOW (ref 36.0–46.0)
Hemoglobin: 11.6 g/dL — ABNORMAL LOW (ref 12.0–15.0)
Immature Granulocytes: 1 %
Lymphocytes Relative: 1 %
Lymphs Abs: 0.2 10*3/uL — ABNORMAL LOW (ref 0.7–4.0)
MCH: 34 pg (ref 26.0–34.0)
MCHC: 34.4 g/dL (ref 30.0–36.0)
MCV: 98.8 fL (ref 80.0–100.0)
Monocytes Absolute: 1.9 10*3/uL — ABNORMAL HIGH (ref 0.1–1.0)
Monocytes Relative: 8 %
Neutro Abs: 21.4 10*3/uL — ABNORMAL HIGH (ref 1.7–7.7)
Neutrophils Relative %: 90 %
Platelets: 644 10*3/uL — ABNORMAL HIGH (ref 150–400)
RBC: 3.41 MIL/uL — ABNORMAL LOW (ref 3.87–5.11)
RDW: 13.8 % (ref 11.5–15.5)
WBC: 23.8 10*3/uL — ABNORMAL HIGH (ref 4.0–10.5)
nRBC: 0 % (ref 0.0–0.2)

## 2020-12-23 MED ORDER — DEXAMETHASONE 4 MG PO TABS: 20.0000 mg | ORAL_TABLET | ORAL | 2 refills | Status: DC

## 2020-12-23 NOTE — Progress Notes (Signed)
[place]  [phone] [phone]  Patient Care Team: [name], MD as PCP - General (Family Medicine) Troy Sine, MD as PCP - Cardiology (Cardiology) [name], MD as Consulting Physician (Hematology and Oncology)   Name of the patient: Diana Brewer  [id number]  1933/12/04   Date of visit: 12/23/20  HPI: Patient is a 87 y.o. female with newly diagnosed multiple myeloma. Planned treatment with Revlimid (lenalidomide) and weekly dexamethasone.   Reason for Consult: Revlimid (lenalidomide) oral chemotherapy education.   PAST MEDICAL HISTORY: Past Medical History:  Diagnosis Date   Arthritis 2016   R wrist predominantly radius scaphoid and midcarpal [name]   History of migraine remote   History of shingles    HLD (hyperlipidemia)    Hypertension    Macular degeneration    [name]   MVP (mitral valve prolapse)    Palpitations    Postherpetic neuralgia 12/27/2012   Ulcerative colitis ([place])    left sided - proctosigmoiditis ([name])    HEMATOLOGY/ONCOLOGY HISTORY:  Oncology History   No history exists.    ALLERGIES:  is allergic to tramadol, codeine, cortisone, and guaifenesin er.  MEDICATIONS:  Current Outpatient Medications  Medication Sig Dispense Refill   acetaminophen (TYLENOL) 500 MG tablet Take 1 tablet (500 mg total) by mouth in the morning, at noon, and at bedtime.     aspirin EC 81 MG tablet Take 1 tablet (81 mg total) by mouth daily. Swallow whole. 30 tablet 11   Cholecalciferol (VITAMIN D3) 25 MCG (1000 UT) CAPS Take 1 capsule (1,000 Units total) by mouth daily. 30 capsule    dexamethasone (DECADRON) 4 MG tablet Take 5 tablets (20 mg total) by mouth daily. Take daily in am after eating food each day for 5 days. Please start medication on Monday 12/08/2020 25 tablet 0   diltiazem (CARDIZEM CD) 180 MG 24 hr capsule TAKE 1 CAPSULE BY MOUTH EVERY DAY 90 capsule 3    ezetimibe (ZETIA) 10 MG tablet Take 1 tablet (10 mg total) by mouth daily. 90 tablet 3   folic acid (FOLVITE) 1 MG tablet Take 1 tablet (1 mg total) by mouth daily.     lenalidomide (REVLIMID) 10 MG capsule Take 1 capsule (10 mg total) by mouth daily. 30 capsule 5   mesalamine (ROWASA) 4 g enema PLACE 60 MLS (4 G TOTAL) RECTALLY AT BEDTIME. 1800 mL 2   Multiple Vitamins-Minerals (CENTRUM SILVER) tablet Take 1 tablet by mouth daily.     propranolol (INDERAL) 40 MG tablet TAKE 1 TABLET BY MOUTH TWICE A DAY 180 tablet 3   sulfaSALAzine (AZULFIDINE) 500 MG tablet TAKE 2 TABLETS BY MOUTH TWICE A DAY. NEEDS OFFICE VISIT FOR FURTHER REFILLS 120 tablet 1   Wheat Dextrin (BENEFIBER PO) 1 tablespoon daily     No current facility-administered medications for this visit.    VITAL SIGNS: There were no vitals taken for this visit. There were no vitals filed for this visit.  Estimated body mass index is 26.8 kg/m as calculated from the following:   Height as of 09/12/20: 5' 2"  (1.575 m).   Weight as of an earlier encounter on 12/23/20: 66.5 kg (146 lb 8 oz).  LABS: CBC:    Component Value Date/Time   WBC 23.8 (H) 12/23/2020 1405   HGB 11.6 (L) 12/23/2020 1405   HGB 10.6 (L) 06/24/2020 0914   HCT 33.7 (L) 12/23/2020 1405   HCT 30.1 (L) 06/24/2020 7616  PLT 644 (H) 12/23/2020 1405   PLT 467 (H) 06/24/2020 0914   MCV 98.8 12/23/2020 1405   MCV 98 (H) 06/24/2020 0914   NEUTROABS 21.4 (H) 12/23/2020 1405   NEUTROABS 4.4 10/01/2019 1140   LYMPHSABS 0.2 (L) 12/23/2020 1405   LYMPHSABS 1.5 10/01/2019 1140   MONOABS 1.9 (H) 12/23/2020 1405   EOSABS 0.0 12/23/2020 1405   EOSABS 0.3 10/01/2019 1140   BASOSABS 0.0 12/23/2020 1405   BASOSABS 0.1 10/01/2019 1140   Comprehensive Metabolic Panel:    Component Value Date/Time   NA 136 12/23/2020 1405   NA 139 07/01/2020 0940   K 4.4 12/23/2020 1405   CL 105 12/23/2020 1405   CO2 22 12/23/2020 1405   BUN 53 (H) 12/23/2020 1405   BUN 23 07/01/2020  0940   CREATININE 1.25 (H) 12/23/2020 1405   CREATININE 1.24 (H) 03/10/2016 0814   GLUCOSE 135 (H) 12/23/2020 1405   CALCIUM 9.2 12/23/2020 1405   AST 35 12/23/2020 1405   ALT 45 (H) 12/23/2020 1405   ALKPHOS 88 12/23/2020 1405   BILITOT 0.7 12/23/2020 1405   BILITOT 0.5 06/24/2020 0914   PROT 3.8 (L) 12/23/2020 1405   PROT 6.7 06/24/2020 0914   ALBUMIN 3.8 12/23/2020 1405   ALBUMIN 4.6 06/24/2020 0914     Present during today's visit: Patient and her husband [name]  Assessment and Plan: Start plan: Pending lenalidomide delivery to patient   Patient Education I spoke with patient for overview of new oral chemotherapy medication: Revlimid (lenalidomide) for the treatment of multiple myeloma, planned duration until disease control or unacceptable toxicities.   Administration: Counseled patient on administration, dosing, side effects, monitoring, drug-food interactions, safe handling, storage, and disposal. Patient will take 10 mg by mouth once a day for 21 days on, 7 days off.  She will also take dexamethasone 47m by mouth weekly.  Side Effects: Side effects include but not limited to: pancytopenia, rash/itchy skin, constipation, diarrhea. Pancytopenia: discussed with patient lower blood counts and that they will be monitored. Patient knows to contact [place] > 100.37F or if feeling ill Rash/itchy skin: discussed with patient that rash/itchy skin may occur during first months of treatment. Patient aware to continue moisturizing and using sunscreen when outside.  Constipation/diarrhea: patient aware to contact [place] any changes to her bowel movements occur and to use loperamide for any loose stools    Patient seemed overwhelmed during visit. Brief highlights of side effects given. Offer was made to rediscuss side effects closer to initiation.   Drug-drug Interactions (DDI): Drug-drug interaction with mesalamine and sulafasalazine--may enhance myelosuppressive effects of  lenalidomide. No changes needed currently, patient will be monitored.   Adherence: After discussion with patient no patient barriers to medication adherence identified.  Reviewed with patient importance of keeping a medication schedule. Medication calendar will be provided once Revlimid delivery date confirmed.   Ms. BGolembeskivoiced understanding and appreciation. All questions answered. Medication handout provided.  Provided patient with [facility] number. Patient knows to call the office with questions or concerns. Oral Chemotherapy Navigation Clinic will continue to follow.  Patient expressed understanding and was in agreement with this plan. She also understands that She can call clinic at any time with any questions, concerns, or complaints.   Medication Access Issues: Revlimid approved by insurance. Will pursue grant funding given high copay.   Follow-up plan: Follow-up phone call closer to initiation once delivery date confirmed  Thank you for allowing me to  participate in the care of this patient.   Time Total: 30 minutes  Visit consisted of counseling and education on dealing with issues of symptom management in the setting of serious and potentially life-threatening illness.Greater than 50%  of this time was spent counseling and coordinating care related to the above assessment and plan.  Patient seen today in shared visit with Elita Quick, PharmD  Signed by: Darl Pikes, PharmD, BCPS, Salley Slaughter, CPP Hematology/Oncology Clinical Pharmacist Practitioner [facility] [phone]  12/23/2020 4:32 PM

## 2020-12-24 ENCOUNTER — Telehealth: Payer: Self-pay | Admitting: Oncology

## 2020-12-24 ENCOUNTER — Telehealth: Payer: Self-pay | Admitting: Pharmacy Technician

## 2020-12-24 MED ORDER — LENALIDOMIDE 10 MG PO CAPS: 10.0000 mg | ORAL_CAPSULE | Freq: Every day | ORAL | 0 refills | Status: DC

## 2020-12-24 NOTE — Telephone Encounter (Signed)
Oral Oncology Patient Advocate Encounter  Was successful in securing patient a $12,000 grant from [facility] to provide copayment coverage for Revlimid.  This will keep the out of pocket expense at $0.    Patient will need to submit income documents within 60 days to [facility] for them to verify income.  Failure to do so will result in the grant being terminated.   [employer] ID: [id number]  I have spoken with the patient.   The billing information is as follows and has been shared with [employer].    RxBin: [id number] PCN: [id number] Member ID: [id number] Group ID: [id number] Dates of Eligibility: 11/24/20 through 11/23/21  Fund:  [place] Patient [place] Phone [phone] Fax [phone] 12/24/2020 11:27 AM

## 2020-12-24 NOTE — Telephone Encounter (Signed)
Attempts made to reach patient (and spouse). No answer/busy signal.  Patient is scheduled to see MD on 9/20. Will send reminder in the mail.

## 2020-12-27 ENCOUNTER — Encounter: Payer: Self-pay | Admitting: Oncology

## 2020-12-27 NOTE — Progress Notes (Signed)
Hematology/Oncology Consult note [facility]  [phone] [phone]  Patient Care Team: [name], MD as PCP - General (Family Medicine) Troy Sine, MD as PCP - Cardiology (Cardiology) [name], MD as Consulting Physician (Hematology and Oncology)   Name of the patient: Diana Brewer  [id number]  10-31-1933   Date of visit: 12/27/20  Diagnosis-Kappa light chain multiple myeloma  Chief complaint/ Reason for visit-discuss further management for multiple myeloma  Heme/Onc history: Patient is a 87 year old female who has been having ongoing back pain for the last 1 year.  She underwent x-ray of her thoracic and lumbar spine which showed T9 inferior endplate fracture as well as remote T5 T7 and T11 fractures.  She was seen by Dr. Lacinda Axon from neurosurgery and was recommended medical management for her osteoporosis.  Her mobility has been affected because of her back pain.  She is mostly sedentary and will walk for short distances.  She had some work-up done by Dr. Danise Mina in June 2022 which showed elevated kappa free light chain of 3813 and ratio of 302.  Myeloma panel showed no M protein.   Patient refused to undergo bone marrow biopsy.PET CT scan showed lytic mottling lesions throughout the visualized osseous structures with single hypermetabolic lytic lesion in the medial left acetabulum concerning for multiple myeloma.  Hypermetabolic rounded soft tissue extending from L5-S1 disc space.  Mild bilateral adrenal hypermetabolism without CT correlate.  Focal hypermetabolism in the rectosigmoid colon without CT correlate.  Interval history-patient continues to have back pain and has completed her pulse dose of steroids.  ECOG PS- 3 Pain scale- 5 Opioid associated constipation- no  Review of systems- Review of Systems  Constitutional:  Positive for malaise/fatigue. Negative for chills, fever and weight loss.  HENT:  Negative  for congestion, ear discharge and nosebleeds.   Eyes:  Negative for blurred vision.  Respiratory:  Negative for cough, hemoptysis, sputum production, shortness of breath and wheezing.   Cardiovascular:  Negative for chest pain, palpitations, orthopnea and claudication.  Gastrointestinal:  Negative for abdominal pain, blood in stool, constipation, diarrhea, heartburn, melena, nausea and vomiting.  Genitourinary:  Negative for dysuria, flank pain, frequency, hematuria and urgency.  Musculoskeletal:  Positive for back pain. Negative for joint pain and myalgias.  Skin:  Negative for rash.  Neurological:  Negative for dizziness, tingling, focal weakness, seizures, weakness and headaches.  Endo/Heme/Allergies:  Does not bruise/bleed easily.  Psychiatric/Behavioral:  Negative for depression and suicidal ideas. The patient does not have insomnia.      Allergies  Allergen Reactions   Tramadol Nausea Only   Codeine Other (See Comments)    nausea     Cortisone Swelling    Swelling    Guaifenesin Er Other (See Comments)    Doesn't work, made her sick     Past Medical History:  Diagnosis Date   Arthritis 2016   R wrist predominantly radius scaphoid and midcarpal [name]   History of migraine remote   History of shingles    HLD (hyperlipidemia)    Hypertension    Macular degeneration    [name]   MVP (mitral valve prolapse)    Palpitations    Postherpetic neuralgia 12/27/2012   Ulcerative colitis ([place])    left sided - proctosigmoiditis ([name])     Past Surgical History:  Procedure Laterality Date   BREAST BIOPSY  1975   COLONOSCOPY  2011   [place]  due to Strep Throat (?epiglottitis)    Social History   Socioeconomic History   Marital status: Married    Spouse name: Not on file   Number of children: Not on file   Years of education: Not on file   Highest education level: Not on file  Occupational History   Not on file  Tobacco Use    Smoking status: Never   Smokeless tobacco: Never  Vaping Use   Vaping Use: Never used  Substance and Sexual Activity   Alcohol use: No   Drug use: No   Sexual activity: Never  Other Topics Concern   Not on file  Social History Narrative   Lives with husband [name])   [place] child (daughter)   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[name]   Activity: walks and active in yard, grows vegetables   Diet: good water, good fruits/vegetables daily   Social Determinants of Radio broadcast assistant Strain: Low Risk    Difficulty of Paying Living Expenses: Not hard at all  Food Insecurity: No Food Insecurity   Worried About Charity fundraiser in the Last Year: Never true   Arboriculturist in the Last Year: Never true  Transportation Needs: No Transportation Needs   Lack of Transportation (Medical): No   Lack of Transportation (Non-Medical): No  Physical Activity: Inactive   Days of Exercise per Week: 0 days   Minutes of Exercise per Session: 0 min  Stress: No Stress Concern Present   Feeling of Stress : Not at all  Social Connections: Not on file  Intimate Partner Violence: Not At Risk   Fear of Current or Ex-Partner: No   Emotionally Abused: No   Physically Abused: No   Sexually Abused: No    Family History  Problem Relation Age of Onset   Hypertension Mother    Stroke Mother    Heart disease Mother    Rheum arthritis Father    Melanoma Father    Melanoma Brother    Lung cancer Paternal Grandfather    Colon cancer Neg Hx    Esophageal cancer Neg Hx    Pancreatic cancer Neg Hx    Stomach cancer Neg Hx      Current Outpatient Medications:    acetaminophen (TYLENOL) 500 MG tablet, Take 1 tablet (500 mg total) by mouth in the morning, at noon, and at bedtime., Disp: , Rfl:    aspirin EC 81 MG tablet, Take 1 tablet (81 mg total) by mouth daily. Swallow whole., Disp: 30 tablet, Rfl: 11   Cholecalciferol (VITAMIN D3) 25 MCG (1000 UT) CAPS, Take 1 capsule (1,000  Units total) by mouth daily., Disp: 30 capsule, Rfl:    diltiazem (CARDIZEM CD) 180 MG 24 hr capsule, TAKE 1 CAPSULE BY MOUTH EVERY DAY, Disp: 90 capsule, Rfl: 3   ezetimibe (ZETIA) 10 MG tablet, Take 1 tablet (10 mg total) by mouth daily., Disp: 90 tablet, Rfl: 3   folic acid (FOLVITE) 1 MG tablet, Take 1 tablet (1 mg total) by mouth daily., Disp: , Rfl:    Multiple Vitamins-Minerals (CENTRUM SILVER) tablet, Take 1 tablet by mouth daily., Disp: , Rfl:    propranolol (INDERAL) 40 MG tablet, TAKE 1 TABLET BY MOUTH TWICE A DAY, Disp: 180 tablet, Rfl: 3   sulfaSALAzine (AZULFIDINE) 500 MG tablet, TAKE 2 TABLETS BY MOUTH TWICE A DAY. NEEDS OFFICE VISIT FOR FURTHER REFILLS, Disp: 120 tablet, Rfl: 1   Wheat Dextrin (BENEFIBER PO), 1 tablespoon  daily, Disp: , Rfl:    dexamethasone (DECADRON) 4 MG tablet, Take 5 tablets (20 mg total) by mouth once a week. Take with breakfast., Disp: 20 tablet, Rfl: 2   lenalidomide (REVLIMID) 10 MG capsule, Take 1 capsule (10 mg total) by mouth daily. Take for 21 days, then hold for 7 days. Repeat every 28 days, Disp: 21 capsule, Rfl: 0   mesalamine (ROWASA) 4 g enema, PLACE 60 MLS (4 G TOTAL) RECTALLY AT BEDTIME., Disp: 1800 mL, Rfl: 2  Physical exam:  Vitals:   12/23/20 1500  BP: 133/67  Pulse: 65  Resp: 20  Temp: (!) 96.8 F (36 C)  TempSrc: Tympanic  SpO2: 97%  Weight: 146 lb 8 oz (66.5 kg)   Physical Exam Constitutional:      General: She is not in acute distress.    Comments: Patient is elderly and frail.  Sitting in wheelchair.  Appears in no acute distress  Cardiovascular:     Rate and Rhythm: Normal rate and regular rhythm.     Heart sounds: Normal heart sounds.  Pulmonary:     Effort: Pulmonary effort is normal.     Breath sounds: Normal breath sounds.  Abdominal:     General: Bowel sounds are normal.     Palpations: Abdomen is soft.  Skin:    General: Skin is warm and dry.  Neurological:     Mental Status: She is alert and oriented to  person, place, and time.     CMP Latest Ref Rng & Units 12/23/2020  Glucose 70 - 99 mg/dL 135(H)  BUN 8 - 23 mg/dL 53(H)  Creatinine 0.44 - 1.00 mg/dL 1.25(H)  Sodium 135 - 145 mmol/L 136  Potassium 3.5 - 5.1 mmol/L 4.4  Chloride 98 - 111 mmol/L 105  CO2 22 - 32 mmol/L 22  Calcium 8.9 - 10.3 mg/dL 9.2  Total Protein 6.5 - 8.1 g/dL 3.8(L)  Total Bilirubin 0.3 - 1.2 mg/dL 0.7  Alkaline Phos 38 - 126 U/L 88  AST 15 - 41 U/L 35  ALT 0 - 44 U/L 45(H)   CBC Latest Ref Rng & Units 12/23/2020  WBC 4.0 - 10.5 K/uL 23.8(H)  Hemoglobin 12.0 - 15.0 g/dL 11.6(L)  Hematocrit 36.0 - 46.0 % 33.7(L)  Platelets 150 - 400 K/uL 644(H)    No images are attached to the encounter.  NM PET Image Initial (PI) Whole Body  Addendum Date: 12/17/2020   ADDENDUM REPORT: 12/17/2020 11:34 ADDENDUM: Upon further review, there is mild uptake associated with the anterolateral left fourth rib, where there is a pathologic fracture (3/126). Electronically Signed   By: Lorin Picket M.D.   On: 12/17/2020 11:34   Result Date: 12/17/2020 CLINICAL DATA:  Initial treatment strategy for multiple myeloma. EXAM: NUCLEAR MEDICINE PET WHOLE BODY TECHNIQUE: 7.8 mCi F-18 FDG was injected intravenously. Full-ring PET imaging was performed from the head to foot after the radiotracer. CT data was obtained and used for attenuation correction and anatomic localization. Fasting blood glucose: 103 mg/dl COMPARISON:  None. FINDINGS: Mediastinal blood pool activity: SUV max 2.6 HEAD/NECK: No abnormal hypermetabolism. Incidental CT findings: None. CHEST: No hypermetabolic mediastinal, hilar or axillary lymph nodes. No hypermetabolic pulmonary nodules. Incidental CT findings: Aberrant right subclavian artery. Atherosclerotic calcification of the aorta and coronary arteries. Heart is enlarged. No pericardial or pleural effusion. ABDOMEN/PELVIS: Mild bilateral adrenal hypermetabolism without CT correlates. No abnormal hypermetabolism in liver,  spleen or pancreas. Focal hypermetabolism in the rectosigmoid colon without a CT correlate. No  hypermetabolic lymph nodes. Incidental CT findings: Liver, gallbladder, adrenal glands, kidneys, spleen, pancreas, stomach and bowel are grossly unremarkable. SKELETON: Focal hypermetabolism in medial left acetabulum is seen in association with a 1.4 cm lytic lesion (3/231), SUV max 5.6. There is rounded protruding soft tissue along the ventral L5-S1 interspace, measuring 1.6 cm (3/207) with an SUV max 6.2. No additional abnormal hypermetabolism. Incidental CT findings: Lytic mottling throughout the visualized osseous structures. Scattered thoracolumbar compression deformities. EXTREMITIES: No abnormal hypermetabolism. Incidental CT findings: None. IMPRESSION: 1. Lytic mottling throughout the visualized osseous structures with a single hypermetabolic lytic lesion in the medial left acetabulum findings consistent with the provided history of multiple myeloma. 2. Hypermetabolic rounded soft tissue extending from the ventral L5-S1 disc space, possibly representing extruded disc material. Multiple myeloma is not excluded. 3. Focal hypermetabolism in the rectosigmoid colon without a CT correlate. 4. Mild bilateral adrenal hypermetabolism, also without CT correlates. 5. Aortic atherosclerosis (ICD10-I70.0). Coronary artery calcification. Electronically Signed: By: Lorin Picket M.D. On: 12/17/2020 10:36     Assessment and plan- Patient is a 87 y.o. female with newly diagnosed kappa light chain multiple myeloma here to discuss further management  Again discussed with the patient that based on the results of her blood work her free light chain ratio is greater than 100 which would be diagnostic of multiple myeloma.Serum free kappa light chain is further elevated at 5618 with a ratio of 749.  PET CT scan shows diffuse lytic lesions in the visualized osseous structures.  There is also a hypermetabolic lytic lesion noted in  the medial left acetabulum which was consistent with myeloma.  We discussed the role of radiation to the left acetabulum and possibly to the soft tissue density in the L5-S1 disc space which could be contributing to her back pain.  However this would involve consultation with radiation oncology and daily radiation treatments for about a week which patient does not wish to go through at this time.  Patient is also refused a bone marrow biopsy and therefore cytogenetics and FISH studies cannot be obtained.  We discussed that multiple myeloma is not curable but there are multiple treatment options available.  We discussed doing weekly dexamethasone 20 mg along with Revlimid which I will start off at a lower dose that 10 mg 3 weeks on and 1 week off.  Discussed risks and benefits of Revlimid including all but not limited to nausea, vomiting, diarrhea, skin rash, fatigue and risk of thromboembolic episodes.  Treatment will be given with a palliative intent.  Patient will need to be on baby aspirin.  We will need to obtain some of her financial information to get copayment assistance for Revlimid.  She will be meeting with oral pharmacy team today but she remains hesitant to divulge any financial information towards getting copayment assistance.  Explained to the patient that myeloma treatment would require frequent follow-ups and blood work checking and cannot be treated virtually.  Patient wishes to avoid coming to the cancer center as much as possible.  We discussed the role of bisphosphonates to reduce incidence of skeletal related fractures and she was noted to have multiple bone lesions on her PET scan. She also had mild hypercalcemia of 10.8 on her previous labs which looks somewhat improved today at 9.2.  Also her renal functions have remained stable between 1.2-1.3 for the last 5 years despite recent diagnosis of multiple myeloma.  Also hemoglobin is remained stable between 10-11 for the last 3 years .  Patient does not wish to proceed with Zometa today.  We also discussed the role of additional chemo based treatments such as Velcade or Darzalex which have better outcomes as compared to Revlimid and dexamethasone alone.  Given the risk of Velcade associated neuropathy Darzalex is certainly a consideration but requires patient to come to the cancer center and be observed for at least an hour after getting Darzalex.  Patient is hesitant to proceed with those treatments as well at this time  For now I will see her back in 4 weeks after she starts Revlimid at the end of this week with repeat labs CBC with differential CMP myeloma panel and serum free light chains  Cancer Staging Multiple myeloma not having achieved remission ([place]) Staging form: Plasma Cell Myeloma and Plasma Cell Disorders, AJCC 8th Edition - Clinical stage from 12/22/2020: Beta-2-microglobulin (mg/L): 5.8, Albumin (g/dL): 1.4, ISS: Stage III, High-risk cytogenetics: Unknown, LDH: Normal - Signed by [name], MD on 12/27/2020 Beta 2 microglobulin range (mg/L): Greater than or equal to 5.5 Albumin range (g/dL): Less than 3.5 Cytogenetics: Unknown Serum calcium level: Elevated Pre-operative calcium level (mg/dL): 10.8 Serum creatinine level: Elevated Bone disease on imaging: Present    Visit Diagnosis 1. Multiple myeloma not having achieved remission ([place])   2. Goals of care, counseling/discussion      Dr. Randa Evens, MD, MPH [facility] at [facility] [phone] 12/27/2020 12:53 PM

## 2020-12-30 NOTE — Telephone Encounter (Signed)
Spoke with Dr Darnell Level today. Will post pone Prolia for about 2 months and re access due to patient following up with Oncologist for MM right now.

## 2020-12-31 NOTE — Telephone Encounter (Addendum)
Patient stopped by the office yesterday to drop off income documents and I faxed them to [name] [phone]).  Diana Brewer Patient [place] Phone [phone] Fax [phone] 12/31/2020 9:07 AM   01/02/21: [facility] asked for patients 1040 to verify income since there was such a gap between [name] and what we submitted. Patient has refused to submit 1040.    [name] Patient [place] Phone [phone] Fax [phone] 01/05/2021 10:18 AM

## 2021-01-05 NOTE — Telephone Encounter (Addendum)
Oral Oncology Patient Advocate Encounter   Was successful in securing patient a $12,000 grant from [employer] [place] (PAF) to provide copayment coverage for Revlimid.  This will keep the out of pocket expense at $0.     I have spoken with the patient.    The billing information is as follows and has been shared with [facility].   RxBin: [id number] PCN:  [id number] Member ID: [id number] Group ID: [id number] Dates of Eligibility: 01/05/21 through 01/05/22    [place] [place] Phone [phone] Fax [phone] 01/05/2021 10:19 AM

## 2021-01-06 ENCOUNTER — Telehealth: Payer: Self-pay | Admitting: *Deleted

## 2021-01-06 NOTE — Telephone Encounter (Signed)
Patient called today to say that her shipment of Revlimid did not come in and [facility] had told her that it would be delivered between 8 AM and 12 noon today.  I told her to start the steroids the same day she starts the revlimid. I reminded that she only take steroids once a week and stick with the day of the week every week. told her to take it in am with food. she wants to take revlimid around 10 am or so. I told her to take at the time is best for her but keep it on schedule so she won't forget. she asked me on the week off is she suppose to take the steroids and I forgot but I thought she did. I told patient that I would ask tomorrow and let her know . She also wants to see if her appt 9/20 will changes since her medication has not came yet. Will check with alyson and she will return phone call.

## 2021-01-07 NOTE — Telephone Encounter (Signed)
Checked on the tracking of the medication, for some reason there is a delay with [employer]. ([employer] tracking: [id number]). Diana Brewer reached out to [facility] to see if there is anything they can go to expedite with delivery.  Spoke with [name] about the delivery delay and the plan for her dexamethasone dosing.

## 2021-01-13 ENCOUNTER — Telehealth: Payer: Self-pay

## 2021-01-13 ENCOUNTER — Other Ambulatory Visit: Payer: Self-pay | Admitting: Pharmacist

## 2021-01-13 DIAGNOSIS — C9000 Multiple myeloma not having achieved remission: Secondary | ICD-10-CM

## 2021-01-13 MED ORDER — LENALIDOMIDE 10 MG PO CAPS: 10.0000 mg | ORAL_CAPSULE | Freq: Every day | ORAL | 0 refills | Status: DC

## 2021-01-13 NOTE — Telephone Encounter (Signed)
Follow-up: Revlimid delivery for Diana Brewer was delayed by ~1 week due to shipping delays on [employer] end. [employer] attempted to deliver the Revlimid today but Diana Brewer declined the delivery because she was worried about how the medication was stored over the past week.  She spoke with [facility] who agreed to send her out an replacement prescription. [facility] asked the office resend them a prescription for the Revlimid so that they could resend the prescription. They anticipate being able to send out the prescription tomorrow 01/14/21 for delivery 01/15/21 Called [employer] program and obtained a new auth number over the phone. New prescription sent to [facility] Called patient to provided with an update, she stated her understanding. She will give me a call when she had medication in hand

## 2021-01-13 NOTE — Addendum Note (Signed)
Addended by: Darl Pikes on: 01/13/2021 03:36 PM   Modules accepted: Orders

## 2021-01-13 NOTE — Telephone Encounter (Signed)
Alyson can you please f/u?

## 2021-01-13 NOTE — Progress Notes (Signed)
Revlimid delivery for Diana Brewer was delayed by ~1 week due to shipping delays on [employer] end. [employer] attempted to deliver the Revlimid today but Diana Brewer declined the delivery because she was worried about how the medication was stored over the past week. She spoke with [facility] who agreed to send her out an replacement prescription. [facility] asked the office resend them a prescription for the Revlimid so that they could resend the prescription.

## 2021-01-16 ENCOUNTER — Telehealth: Payer: Self-pay | Admitting: Pharmacist

## 2021-01-16 NOTE — Telephone Encounter (Signed)
Oral Chemotherapy Pharmacist Encounter   Diana Brewer has received her Revlimid (lenalidomide) and plans on getting started on her lenalidomide and dexamethasone tomorrow 01/17/21.   Reviewed how to take both the lenalidomide and dexamethasone. Also reviewed potential side effects of lenalidomide and answered all of her questions about getting started.  Patient calendar will be mailed to [name] and emailed to her daughter.  Darl Pikes, PharmD, BCPS, BCOP, CPP Hematology/Oncology Clinical Pharmacist [facility]/HP/AP Oral [facility] [phone]  01/16/2021 2:37 PM

## 2021-01-19 ENCOUNTER — Other Ambulatory Visit: Payer: Self-pay | Admitting: Cardiovascular Disease

## 2021-01-19 NOTE — Telephone Encounter (Signed)
We can place refill but she needs to keep the appt this week, 01/21/21 with Diana Brewer, pac It is important that she makes this appt We can give refill at this appt, but also address other concerns and recent abnl imaging

## 2021-01-19 NOTE — Telephone Encounter (Signed)
Patient called upset said she is not able to come into the office due to her condition and she is wanting to reach out to Dr. Hilarie Fredrickson to ask if he would approve her getting a refill without having to come in " just to see my face " per patient.

## 2021-01-19 NOTE — Telephone Encounter (Signed)
Dr Hilarie Fredrickson- Please advise.... Do you want me to continue meds without office visit? See recent PET scan (Focal hypermetabolism in the rectosigmoid colon without CT correlate)

## 2021-01-20 MED ORDER — SULFASALAZINE 500 MG PO TABS: ORAL_TABLET | ORAL | 4 refills | Status: AC

## 2021-01-20 NOTE — Addendum Note (Signed)
Addended by: Aleatha Borer on: 01/20/2021 01:46 PM   Modules accepted: Orders

## 2021-01-20 NOTE — Telephone Encounter (Signed)
Called and spoke patient about coming into the office tomorrow to see Amy. She had me cancel her appointment as she has been too weak to come in and is very tired due to going to so many doctors due to her cancer. I did advise that you wanted her to come in due to her recent imaging to discuss some of the findings. She felt like neither Amy, nor you would do anything due to her age. The patient had me reschedule her to your next available which is 04/22/2021. She also stated that [facility] must be making you see her and that you should be able to see everything she has in her chart.

## 2021-01-21 ENCOUNTER — Encounter: Payer: Self-pay | Admitting: *Deleted

## 2021-01-21 ENCOUNTER — Ambulatory Visit: Payer: Medicare Other | Admitting: Physician Assistant

## 2021-01-27 ENCOUNTER — Encounter: Payer: Self-pay | Admitting: Oncology

## 2021-01-27 ENCOUNTER — Inpatient Hospital Stay: Payer: Medicare Other

## 2021-01-27 ENCOUNTER — Inpatient Hospital Stay: Payer: Medicare Other | Attending: Oncology | Admitting: Oncology

## 2021-01-27 VITALS — BP 138/52 | HR 64 | Temp 97.3°F | Resp 16 | Wt 136.0 lb

## 2021-01-27 DIAGNOSIS — Z801 Family history of malignant neoplasm of trachea, bronchus and lung: Secondary | ICD-10-CM | POA: Diagnosis not present

## 2021-01-27 DIAGNOSIS — Z823 Family history of stroke: Secondary | ICD-10-CM | POA: Insufficient documentation

## 2021-01-27 DIAGNOSIS — R5383 Other fatigue: Secondary | ICD-10-CM | POA: Diagnosis not present

## 2021-01-27 DIAGNOSIS — Z888 Allergy status to other drugs, medicaments and biological substances status: Secondary | ICD-10-CM | POA: Insufficient documentation

## 2021-01-27 DIAGNOSIS — Z79899 Other long term (current) drug therapy: Secondary | ICD-10-CM | POA: Diagnosis not present

## 2021-01-27 DIAGNOSIS — M810 Age-related osteoporosis without current pathological fracture: Secondary | ICD-10-CM | POA: Diagnosis not present

## 2021-01-27 DIAGNOSIS — Z885 Allergy status to narcotic agent status: Secondary | ICD-10-CM | POA: Diagnosis not present

## 2021-01-27 DIAGNOSIS — Z8261 Family history of arthritis: Secondary | ICD-10-CM | POA: Diagnosis not present

## 2021-01-27 DIAGNOSIS — M549 Dorsalgia, unspecified: Secondary | ICD-10-CM | POA: Insufficient documentation

## 2021-01-27 DIAGNOSIS — Z808 Family history of malignant neoplasm of other organs or systems: Secondary | ICD-10-CM | POA: Diagnosis not present

## 2021-01-27 DIAGNOSIS — N183 Chronic kidney disease, stage 3 unspecified: Secondary | ICD-10-CM | POA: Diagnosis not present

## 2021-01-27 DIAGNOSIS — Z8249 Family history of ischemic heart disease and other diseases of the circulatory system: Secondary | ICD-10-CM | POA: Insufficient documentation

## 2021-01-27 DIAGNOSIS — C9000 Multiple myeloma not having achieved remission: Secondary | ICD-10-CM

## 2021-01-27 LAB — COMPREHENSIVE METABOLIC PANEL
ALT: 27 U/L (ref 0–44)
AST: 18 U/L (ref 15–41)
Albumin: 3.5 g/dL (ref 3.5–5.0)
Alkaline Phosphatase: 109 U/L (ref 38–126)
Anion gap: 7 (ref 5–15)
BUN: 29 mg/dL — ABNORMAL HIGH (ref 8–23)
CO2: 25 mmol/L (ref 22–32)
Calcium: 8.3 mg/dL — ABNORMAL LOW (ref 8.9–10.3)
Chloride: 106 mmol/L (ref 98–111)
Creatinine, Ser: 1.36 mg/dL — ABNORMAL HIGH (ref 0.44–1.00)
GFR, Estimated: 38 mL/min — ABNORMAL LOW (ref >60)
Glucose, Bld: 94 mg/dL (ref 70–99)
Potassium: 3.8 mmol/L (ref 3.5–5.1)
Sodium: 138 mmol/L (ref 135–145)
Total Bilirubin: 0.4 mg/dL (ref 0.3–1.2)
Total Protein: 5.3 g/dL — ABNORMAL LOW (ref 6.5–8.1)

## 2021-01-27 LAB — CBC WITH DIFFERENTIAL/PLATELET
Abs Immature Granulocytes: 0.21 10*3/uL — ABNORMAL HIGH (ref 0.00–0.07)
Basophils Absolute: 0.1 10*3/uL (ref 0.0–0.1)
Basophils Relative: 0 %
Eosinophils Absolute: 0.1 10*3/uL (ref 0.0–0.5)
Eosinophils Relative: 1 %
HCT: 30.4 % — ABNORMAL LOW (ref 36.0–46.0)
Hemoglobin: 9.9 g/dL — ABNORMAL LOW (ref 12.0–15.0)
Immature Granulocytes: 1 %
Lymphocytes Relative: 6 %
Lymphs Abs: 1.3 10*3/uL (ref 0.7–4.0)
MCH: 33.1 pg (ref 26.0–34.0)
MCHC: 32.6 g/dL (ref 30.0–36.0)
MCV: 101.7 fL — ABNORMAL HIGH (ref 80.0–100.0)
Monocytes Absolute: 2.8 10*3/uL — ABNORMAL HIGH (ref 0.1–1.0)
Monocytes Relative: 12 %
Neutro Abs: 19 10*3/uL — ABNORMAL HIGH (ref 1.7–7.7)
Neutrophils Relative %: 80 %
Platelets: 384 10*3/uL (ref 150–400)
RBC: 2.99 MIL/uL — ABNORMAL LOW (ref 3.87–5.11)
RDW: 14 % (ref 11.5–15.5)
WBC: 23.5 10*3/uL — ABNORMAL HIGH (ref 4.0–10.5)
nRBC: 0 % (ref 0.0–0.2)

## 2021-01-27 NOTE — Progress Notes (Signed)
Pt in for follow up, pt concerned on prognosis of multiple myeloma and has multiple questions regarding multiple myeloma treatment regime.

## 2021-01-28 LAB — KAPPA/LAMBDA LIGHT CHAINS
Kappa free light chain: 125.2 mg/L — ABNORMAL HIGH (ref 3.3–19.4)
Kappa, lambda light chain ratio: 9.86 — ABNORMAL HIGH (ref 0.26–1.65)
Lambda free light chains: 12.7 mg/L (ref 5.7–26.3)

## 2021-01-29 ENCOUNTER — Encounter: Payer: Self-pay | Admitting: Oncology

## 2021-01-29 NOTE — Progress Notes (Signed)
Hematology/Oncology Consult note [facility]  Telephone:([phone] [phone]  Patient Care Team: [name], MD as PCP - General (Family Medicine) Troy Sine, MD as PCP - Cardiology (Cardiology) [name], MD as Consulting Physician (Hematology and Oncology)   Name of the patient: Diana Brewer  [id number]  1933/11/13   Date of visit: 01/29/21  Diagnosis-Kappa light chain multiple myeloma  Chief complaint/ Reason for visit-routine follow-up of myeloma on Revlimid  Heme/Onc history: Patient is a 87 year old female who has been having ongoing back pain for the last 1 year.  She underwent x-ray of her thoracic and lumbar spine which showed T9 inferior endplate fracture as well as remote T5 T7 and T11 fractures.  She was seen by Dr. Lacinda Axon from neurosurgery and was recommended medical management for her osteoporosis.  Her mobility has been affected because of her back pain.  She is mostly sedentary and will walk for short distances.  She had some work-up done by Dr. Danise Mina in June 2022 which showed elevated kappa free light chain of 3813 and ratio of 302.  Myeloma panel showed no M protein.   Patient refused to undergo bone marrow biopsy.PET CT scan showed lytic mottling lesions throughout the visualized osseous structures with single hypermetabolic lytic lesion in the medial left acetabulum concerning for multiple myeloma.  Hypermetabolic rounded soft tissue extending from L5-S1 disc space.  Mild bilateral adrenal hypermetabolism without CT correlate.  Focal hypermetabolism in the rectosigmoid colon without CT correlate.  Patient started on Revlimid in August 2022.  She continues to refuse additional treatments, bone marrow biopsy and bisphosphonates    Interval history-reports that her back pain has improved after starting Revlimid.  She is able to get out of her wheelchair and do a little more around the house.  She had some  self-limited diarrhea and states that she used Pepto-Bismol which helped her.  ECOG PS- 2 Pain scale- 4 Opioid associated constipation- no  Review of systems- Review of Systems  Constitutional:  Positive for malaise/fatigue. Negative for chills, fever and weight loss.  HENT:  Negative for congestion, ear discharge and nosebleeds.   Eyes:  Negative for blurred vision.  Respiratory:  Negative for cough, hemoptysis, sputum production, shortness of breath and wheezing.   Cardiovascular:  Negative for chest pain, palpitations, orthopnea and claudication.  Gastrointestinal:  Negative for abdominal pain, blood in stool, constipation, diarrhea, heartburn, melena, nausea and vomiting.  Genitourinary:  Negative for dysuria, flank pain, frequency, hematuria and urgency.  Musculoskeletal:  Positive for back pain. Negative for joint pain and myalgias.  Skin:  Negative for rash.  Neurological:  Negative for dizziness, tingling, focal weakness, seizures, weakness and headaches.  Endo/Heme/Allergies:  Does not bruise/bleed easily.  Psychiatric/Behavioral:  Negative for depression and suicidal ideas. The patient does not have insomnia.      Allergies  Allergen Reactions   Tramadol Nausea Only   Codeine Other (See Comments)    nausea     Cortisone Swelling    Swelling    Guaifenesin Er Other (See Comments)    Doesn't work, made her sick     Past Medical History:  Diagnosis Date   Arthritis 2016   R wrist predominantly radius scaphoid and midcarpal [name]   History of migraine remote   History of shingles    HLD (hyperlipidemia)    Hypertension    Macular degeneration    [place]   Multiple myeloma ([place])    MVP (mitral valve  prolapse)    Palpitations    Postherpetic neuralgia 12/27/2012   Ulcerative colitis ([place])    left sided - proctosigmoiditis ([name])     Past Surgical History:  Procedure Laterality Date   BREAST BIOPSY  1975   COLONOSCOPY  2011   [facility]    TRACHEOSTOMY  1942   due to Strep Throat (?epiglottitis)    Social History   Socioeconomic History   Marital status: Married    Spouse name: Not on file   Number of children: Not on file   Years of education: Not on file   Highest education level: Not on file  Occupational History   Not on file  Tobacco Use   Smoking status: Never   Smokeless tobacco: Never  Vaping Use   Vaping Use: Never used  Substance and Sexual Activity   Alcohol use: No   Drug use: No   Sexual activity: Never  Other Topics Concern   Not on file  Social History Narrative   Lives with husband [name])   [place] child (daughter)   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[name]   Activity: walks and active in yard, grows vegetables   Diet: good water, good fruits/vegetables daily   Social Determinants of Radio broadcast assistant Strain: Low Risk    Difficulty of Paying Living Expenses: Not hard at all  Food Insecurity: No Food Insecurity   Worried About Charity fundraiser in the Last Year: Never true   Arboriculturist in the Last Year: Never true  Transportation Needs: No Transportation Needs   Lack of Transportation (Medical): No   Lack of Transportation (Non-Medical): No  Physical Activity: Inactive   Days of Exercise per Week: 0 days   Minutes of Exercise per Session: 0 min  Stress: No Stress Concern Present   Feeling of Stress : Not at all  Social Connections: Not on file  Intimate Partner Violence: Not At Risk   Fear of Current or Ex-Partner: No   Emotionally Abused: No   Physically Abused: No   Sexually Abused: No    Family History  Problem Relation Age of Onset   Hypertension Mother    Stroke Mother    Heart disease Mother    Rheum arthritis Father    Melanoma Father    Melanoma Brother    Lung cancer Paternal Grandfather    Colon cancer Neg Hx    Esophageal cancer Neg Hx    Pancreatic cancer Neg Hx    Stomach cancer Neg Hx      Current Outpatient Medications:     acetaminophen (TYLENOL) 500 MG tablet, Take 1 tablet (500 mg total) by mouth in the morning, at noon, and at bedtime., Disp: , Rfl:    aspirin EC 81 MG tablet, Take 1 tablet (81 mg total) by mouth daily. Swallow whole., Disp: 30 tablet, Rfl: 11   Cholecalciferol (VITAMIN D3) 25 MCG (1000 UT) CAPS, Take 1 capsule (1,000 Units total) by mouth daily., Disp: 30 capsule, Rfl:    dexamethasone (DECADRON) 4 MG tablet, Take 5 tablets (20 mg total) by mouth once a week. Take with breakfast., Disp: 20 tablet, Rfl: 2   diltiazem (CARDIZEM CD) 180 MG 24 hr capsule, TAKE 1 CAPSULE BY MOUTH EVERY DAY, Disp: 90 capsule, Rfl: 3   ezetimibe (ZETIA) 10 MG tablet, Take 1 tablet (10 mg total) by mouth daily., Disp: 90 tablet, Rfl: 3   lenalidomide (REVLIMID) 10 MG capsule, Take 1  capsule (10 mg total) by mouth daily. Take for 21 days, then hold for 7 days. Repeat every 28 days, Disp: 21 capsule, Rfl: 0   polyethylene glycol (MIRALAX / GLYCOLAX) 17 g packet, Take 17 g by mouth daily., Disp: , Rfl:    propranolol (INDERAL) 40 MG tablet, TAKE 1 TABLET BY MOUTH TWICE A DAY, Disp: 180 tablet, Rfl: 1   sulfaSALAzine (AZULFIDINE) 500 MG tablet, TAKE 2 TABLETS BY MOUTH TWICE A DAY. *PLEASE KEEP SCHEDULED APPOINTMENT FOR FURTHER REFILLS.* (Patient taking differently: TAKE 2 TABLETS BY MOUTH TWICE A DAY. *PLEASE KEEP SCHEDULED APPOINTMENT FOR FURTHER REFILLS.* pt states taking 2 in morning and one in the evening.), Disp: 120 tablet, Rfl: 4   Wheat Dextrin (BENEFIBER PO), 1 tablespoon daily, Disp: , Rfl:    folic acid (FOLVITE) 1 MG tablet, Take 1 tablet (1 mg total) by mouth daily. (Patient not taking: Reported on 01/27/2021), Disp: , Rfl:    mesalamine (ROWASA) 4 g enema, PLACE 60 MLS (4 G TOTAL) RECTALLY AT BEDTIME. (Patient not taking: Reported on 01/27/2021), Disp: 1800 mL, Rfl: 2   Multiple Vitamins-Minerals (CENTRUM SILVER) tablet, Take 1 tablet by mouth daily. (Patient not taking: Reported on 01/27/2021), Disp: , Rfl:    Physical exam:  Vitals:   01/27/21 1419  BP: (!) 138/52  Pulse: 64  Resp: 16  Temp: (!) 97.3 F (36.3 C)  TempSrc: Tympanic  SpO2: 99%  Weight: 136 lb (61.7 kg)   Physical Exam Constitutional:      Comments: Sitting in a wheelchair and appears in no acute distress  Cardiovascular:     Rate and Rhythm: Normal rate and regular rhythm.     Heart sounds: Normal heart sounds.  Pulmonary:     Effort: Pulmonary effort is normal.     Breath sounds: Normal breath sounds.  Abdominal:     General: Bowel sounds are normal.     Palpations: Abdomen is soft.  Musculoskeletal:     Comments: Trace bilateral edema  Skin:    General: Skin is warm and dry.  Neurological:     Mental Status: She is alert and oriented to person, place, and time.     CMP Latest Ref Rng & Units 01/27/2021  Glucose 70 - 99 mg/dL 94  BUN 8 - 23 mg/dL 29(H)  Creatinine 0.44 - 1.00 mg/dL 1.36(H)  Sodium 135 - 145 mmol/L 138  Potassium 3.5 - 5.1 mmol/L 3.8  Chloride 98 - 111 mmol/L 106  CO2 22 - 32 mmol/L 25  Calcium 8.9 - 10.3 mg/dL 8.3(L)  Total Protein 6.5 - 8.1 g/dL 5.3(L)  Total Bilirubin 0.3 - 1.2 mg/dL 0.4  Alkaline Phos 38 - 126 U/L 109  AST 15 - 41 U/L 18  ALT 0 - 44 U/L 27   CBC Latest Ref Rng & Units 01/27/2021  WBC 4.0 - 10.5 K/uL 23.5(H)  Hemoglobin 12.0 - 15.0 g/dL 9.9(L)  Hematocrit 36.0 - 46.0 % 30.4(L)  Platelets 150 - 400 K/uL 384    Assessment and plan- Patient is a 87 y.o. female with kappa light chain multiple myeloma on Revlimid here for routine follow-up  Patient is currently on Revlimid 10 mg 3 weeks on and 1 week off.  She is also on weekly Decadron 20 mg.  Suspect leukocytosis secondary to Decadron.  Clinically no signs and symptoms of infection.  We did discuss trying to increase the dose of Revlimid to 15 mg but patient does not want to do that just yet.  She is also not interested in getting any additional treatments such as Velcade or Darzalex.  She does not wish to proceed  with bisphosphonates for her lytic bone lesions.  In terms of her myeloma numbers her kappa light chain ratio has decreased from 749-9.86 in 1 month.  Myeloma panel still currently pending.  Her hemoglobin is somewhat lower today at 9.9 which I suspect may be due to Revlimid.  Continue to monitor serum calcium levels which were previously elevated at 11 have now decreased to 8.3.  If it consistently remains low I will ask her to take oral calcium as well.  She has baseline CKD which is currently stable around 1.3  I will see her back in 4 weeks with CBC with differential CMP myeloma panel and serum free light chains.  We will also check ferritin and iron studies B12 and folate at that time   Visit Diagnosis 1. Multiple myeloma not having achieved remission ([place])   2. Multiple myeloma without remission ([place])   3. High risk medication use      Dr. Randa Evens, MD, MPH [facility] [phone] 01/29/2021 9:16 AM

## 2021-02-02 LAB — MULTIPLE MYELOMA PANEL, SERUM
Albumin SerPl Elph-Mcnc: 3 g/dL (ref 2.9–4.4)
Albumin/Glob SerPl: 1.6 (ref 0.7–1.7)
Alpha 1: 0.3 g/dL (ref 0.0–0.4)
Alpha2 Glob SerPl Elph-Mcnc: 0.6 g/dL (ref 0.4–1.0)
B-Globulin SerPl Elph-Mcnc: 0.8 g/dL (ref 0.7–1.3)
Gamma Glob SerPl Elph-Mcnc: 0.3 g/dL — ABNORMAL LOW (ref 0.4–1.8)
Globulin, Total: 2 g/dL — ABNORMAL LOW (ref 2.2–3.9)
IgA: 40 mg/dL — ABNORMAL LOW (ref 64–422)
IgG (Immunoglobin G), Serum: 344 mg/dL — ABNORMAL LOW (ref 586–1602)
IgM (Immunoglobulin M), Srm: 8 mg/dL — ABNORMAL LOW (ref 26–217)
Total Protein ELP: 5 g/dL — ABNORMAL LOW (ref 6.0–8.5)

## 2021-02-04 ENCOUNTER — Other Ambulatory Visit: Payer: Self-pay | Admitting: *Deleted

## 2021-02-04 DIAGNOSIS — C9000 Multiple myeloma not having achieved remission: Secondary | ICD-10-CM

## 2021-02-04 MED ORDER — LENALIDOMIDE 10 MG PO CAPS
10.0000 mg | ORAL_CAPSULE | Freq: Every day | ORAL | 0 refills | Status: DC
Start: 2021-02-04 — End: 2021-04-06

## 2021-02-09 ENCOUNTER — Telehealth: Payer: Self-pay | Admitting: Family Medicine

## 2021-02-09 DIAGNOSIS — C9000 Multiple myeloma not having achieved remission: Secondary | ICD-10-CM

## 2021-02-09 DIAGNOSIS — D8989 Other specified disorders involving the immune mechanism, not elsewhere classified: Secondary | ICD-10-CM

## 2021-02-09 NOTE — Telephone Encounter (Signed)
Pt called in stated she needs a second opinion would like a referral to see Dr. [name] Fax # [phone] . Please Advise [phone]

## 2021-02-10 NOTE — Telephone Encounter (Signed)
Spoke with pt asking for details concerning request.  Says she wants to see Dr. Marin Olp for 2nd opinion concerning the cancer.

## 2021-02-11 NOTE — Addendum Note (Signed)
Addended by: [name] on: 02/11/2021 08:27 AM   Modules accepted: Orders

## 2021-02-11 NOTE — Telephone Encounter (Signed)
Spoke with pt relaying Dr. G's message. Pt verbalizes understanding.  

## 2021-02-11 NOTE — Telephone Encounter (Addendum)
Dr Marin Olp is part of same practice as Dr Janese Banks.  It will be up to that practice to see if she can see Dr Marin Olp.  I've placed referral.

## 2021-02-17 NOTE — Telephone Encounter (Signed)
Printed and faxed referral.

## 2021-02-17 NOTE — Telephone Encounter (Signed)
Diana Brewer daugther called in and stated that the Dr. Arelia Sneddon never received the referral and she can not get a appointment until the referral is sent. The fax number att: Suanne Marker [phone].

## 2021-02-17 NOTE — Telephone Encounter (Signed)
I placed referral on 10/5 but it looks like it was closed by oncology office because pt is already an established pt of Dr Elroy Channel.  Lattie Haw can you print referral and fax attn below #?

## 2021-02-17 NOTE — Telephone Encounter (Signed)
Called and checked in on her referral for the oncologist

## 2021-02-18 ENCOUNTER — Telehealth: Payer: Self-pay | Admitting: *Deleted

## 2021-02-18 NOTE — Telephone Encounter (Signed)
Per referral Dr. Danise Mina - called and gave upcoming appointments - confirmed -mailed welcome packet with calendar

## 2021-02-25 ENCOUNTER — Inpatient Hospital Stay: Payer: Medicare Other | Admitting: Oncology

## 2021-02-25 ENCOUNTER — Encounter: Payer: Self-pay | Admitting: Oncology

## 2021-02-25 ENCOUNTER — Inpatient Hospital Stay: Payer: Medicare Other | Attending: Oncology

## 2021-02-25 ENCOUNTER — Other Ambulatory Visit: Payer: Self-pay

## 2021-02-25 VITALS — BP 123/85 | HR 85 | Temp 98.6°F | Resp 17 | Wt 134.0 lb

## 2021-02-25 DIAGNOSIS — C9000 Multiple myeloma not having achieved remission: Secondary | ICD-10-CM | POA: Diagnosis not present

## 2021-02-25 DIAGNOSIS — D72829 Elevated white blood cell count, unspecified: Secondary | ICD-10-CM | POA: Insufficient documentation

## 2021-02-25 DIAGNOSIS — Z79899 Other long term (current) drug therapy: Secondary | ICD-10-CM | POA: Diagnosis not present

## 2021-02-25 LAB — COMPREHENSIVE METABOLIC PANEL
ALT: 59 U/L — ABNORMAL HIGH (ref 0–44)
AST: 49 U/L — ABNORMAL HIGH (ref 15–41)
Albumin: 3.1 g/dL — ABNORMAL LOW (ref 3.5–5.0)
Alkaline Phosphatase: 102 U/L (ref 38–126)
Anion gap: 6 (ref 5–15)
BUN: 28 mg/dL — ABNORMAL HIGH (ref 8–23)
CO2: 28 mmol/L (ref 22–32)
Calcium: 8.1 mg/dL — ABNORMAL LOW (ref 8.9–10.3)
Chloride: 102 mmol/L (ref 98–111)
Creatinine, Ser: 1.54 mg/dL — ABNORMAL HIGH (ref 0.44–1.00)
GFR, Estimated: 33 mL/min — ABNORMAL LOW (ref >60)
Glucose, Bld: 115 mg/dL — ABNORMAL HIGH (ref 70–99)
Potassium: 4.2 mmol/L (ref 3.5–5.1)
Sodium: 136 mmol/L (ref 135–145)
Total Bilirubin: 0.9 mg/dL (ref 0.3–1.2)
Total Protein: 4.8 g/dL — ABNORMAL LOW (ref 6.5–8.1)

## 2021-02-25 LAB — CBC WITH DIFFERENTIAL/PLATELET
Abs Immature Granulocytes: 0.24 10*3/uL — ABNORMAL HIGH (ref 0.00–0.07)
Basophils Absolute: 0.1 10*3/uL (ref 0.0–0.1)
Basophils Relative: 1 %
Eosinophils Absolute: 0.6 10*3/uL — ABNORMAL HIGH (ref 0.0–0.5)
Eosinophils Relative: 3 %
HCT: 27.6 % — ABNORMAL LOW (ref 36.0–46.0)
Hemoglobin: 9.2 g/dL — ABNORMAL LOW (ref 12.0–15.0)
Immature Granulocytes: 1 %
Lymphocytes Relative: 7 %
Lymphs Abs: 1.2 10*3/uL (ref 0.7–4.0)
MCH: 33.5 pg (ref 26.0–34.0)
MCHC: 33.3 g/dL (ref 30.0–36.0)
MCV: 100.4 fL — ABNORMAL HIGH (ref 80.0–100.0)
Monocytes Absolute: 2.1 10*3/uL — ABNORMAL HIGH (ref 0.1–1.0)
Monocytes Relative: 12 %
Neutro Abs: 13.2 10*3/uL — ABNORMAL HIGH (ref 1.7–7.7)
Neutrophils Relative %: 76 %
Platelets: 208 10*3/uL (ref 150–400)
RBC: 2.75 MIL/uL — ABNORMAL LOW (ref 3.87–5.11)
RDW: 16.7 % — ABNORMAL HIGH (ref 11.5–15.5)
WBC: 17.4 10*3/uL — ABNORMAL HIGH (ref 4.0–10.5)
nRBC: 0 % (ref 0.0–0.2)

## 2021-02-25 LAB — IRON AND TIBC
Iron: 87 ug/dL (ref 28–170)
Saturation Ratios: 40 % — ABNORMAL HIGH (ref 10.4–31.8)
TIBC: 218 ug/dL — ABNORMAL LOW (ref 250–450)
UIBC: 131 ug/dL

## 2021-02-25 LAB — FOLATE: Folate: 5.1 ng/mL — ABNORMAL LOW (ref >5.9)

## 2021-02-25 LAB — VITAMIN B12: Vitamin B-12: 471 pg/mL (ref 180–914)

## 2021-02-25 MED ORDER — FOLIC ACID 1 MG PO TABS: 1.0000 mg | ORAL_TABLET | Freq: Every day | ORAL | 1 refills | Status: DC

## 2021-02-25 NOTE — Progress Notes (Signed)
Hematology/Oncology Consult note [facility]  [phone] [phone]  Patient Care Team: [name], MD as PCP - General (Family Medicine) Troy Sine, MD as PCP - Cardiology (Cardiology) [name], MD as Consulting Physician (Hematology and Oncology)   Name of the patient: Diana Brewer  [id number]  09/12/1933   Date of visit: 02/25/21  Diagnosis-light chain multiple myeloma  Chief complaint/ Reason for visit-routine follow-up of multiple myeloma on Revlimid  Heme/Onc history: Patient is a 87 year old female who has been having ongoing back pain for the last 1 year.  She underwent x-ray of her thoracic and lumbar spine which showed T9 inferior endplate fracture as well as remote T5 T7 and T11 fractures.  She was seen by Dr. Lacinda Axon from neurosurgery and was recommended medical management for her osteoporosis.  Her mobility has been affected because of her back pain.  She is mostly sedentary and will walk for short distances.  She had some work-up done by Dr. Danise Mina in June 2022 which showed elevated kappa free light chain of 3813 and ratio of 302.  Myeloma panel showed no M protein.   Patient refused to undergo bone marrow biopsy.PET CT scan showed lytic mottling lesions throughout the visualized osseous structures with single hypermetabolic lytic lesion in the medial left acetabulum concerning for multiple myeloma.  Hypermetabolic rounded soft tissue extending from L5-S1 disc space.  Mild bilateral adrenal hypermetabolism without CT correlate.  Focal hypermetabolism in the rectosigmoid colon without CT correlate.   Patient started on Revlimid in August 2022.  Diagnosis of multiple myeloma was made purely on the basis of free light chain ratio that was more than 100.  She continues to refuse additional treatments, bone marrow biopsy and bisphosphonates  Interval history-patient reports that her back pain has improved after  taking Revlimid and Decadron.  She is able to walk short distances around the house which she was unable to do before.  She is not taking any pain medications presently she reports fatigue.  Denies any skin rash or diarrhea  ECOG PS- 3 Pain scale- 2 Opioid associated constipation- no  Review of systems- Review of Systems  Constitutional:  Positive for malaise/fatigue. Negative for chills, fever and weight loss.  HENT:  Negative for congestion, ear discharge and nosebleeds.   Eyes:  Negative for blurred vision.  Respiratory:  Negative for cough, hemoptysis, sputum production, shortness of breath and wheezing.   Cardiovascular:  Negative for chest pain, palpitations, orthopnea and claudication.  Gastrointestinal:  Negative for abdominal pain, blood in stool, constipation, diarrhea, heartburn, melena, nausea and vomiting.  Genitourinary:  Negative for dysuria, flank pain, frequency, hematuria and urgency.  Musculoskeletal:  Negative for back pain, joint pain and myalgias.  Skin:  Negative for rash.  Neurological:  Negative for dizziness, tingling, focal weakness, seizures, weakness and headaches.  Endo/Heme/Allergies:  Does not bruise/bleed easily.  Psychiatric/Behavioral:  Negative for depression and suicidal ideas. The patient does not have insomnia.       Allergies  Allergen Reactions   Tramadol Nausea Only   Codeine Other (See Comments)    nausea     Cortisone Swelling    Swelling    Guaifenesin Er Other (See Comments)    Doesn't work, made her sick     Past Medical History:  Diagnosis Date   Arthritis 2016   R wrist predominantly radius scaphoid and midcarpal [name]   History of migraine remote   History of shingles  HLD (hyperlipidemia)    Hypertension    Macular degeneration    [name]   Multiple myeloma (HCC)    MVP (mitral valve prolapse)    Palpitations    Postherpetic neuralgia 12/27/2012   Ulcerative colitis ([place])    left sided - proctosigmoiditis  ([name])     Past Surgical History:  Procedure Laterality Date   BREAST BIOPSY  1975   COLONOSCOPY  2011   [facility]   TRACHEOSTOMY  1942   due to Strep Throat (?epiglottitis)    Social History   Socioeconomic History   Marital status: Married    Spouse name: Not on file   Number of children: Not on file   Years of education: Not on file   Highest education level: Not on file  Occupational History   Not on file  Tobacco Use   Smoking status: Never   Smokeless tobacco: Never  Vaping Use   Vaping Use: Never used  Substance and Sexual Activity   Alcohol use: No   Drug use: No   Sexual activity: Never  Other Topics Concern   Not on file  Social History Narrative   Lives with husband [name])   [place] child (daughter)   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[name]   Activity: walks and active in yard, grows vegetables   Diet: good water, good fruits/vegetables daily   Social Determinants of Radio broadcast assistant Strain: Low Risk    Difficulty of Paying Living Expenses: Not hard at all  Food Insecurity: No Food Insecurity   Worried About Charity fundraiser in the Last Year: Never true   Arboriculturist in the Last Year: Never true  Transportation Needs: No Transportation Needs   Lack of Transportation (Medical): No   Lack of Transportation (Non-Medical): No  Physical Activity: Inactive   Days of Exercise per Week: 0 days   Minutes of Exercise per Session: 0 min  Stress: No Stress Concern Present   Feeling of Stress : Not at all  Social Connections: Not on file  Intimate Partner Violence: Not At Risk   Fear of Current or Ex-Partner: No   Emotionally Abused: No   Physically Abused: No   Sexually Abused: No    Family History  Problem Relation Age of Onset   Hypertension Mother    Stroke Mother    Heart disease Mother    Rheum arthritis Father    Melanoma Father    Melanoma Brother    Lung cancer Paternal Grandfather    Colon cancer Neg Hx     Esophageal cancer Neg Hx    Pancreatic cancer Neg Hx    Stomach cancer Neg Hx      Current Outpatient Medications:    acetaminophen (TYLENOL) 500 MG tablet, Take 1 tablet (500 mg total) by mouth in the morning, at noon, and at bedtime., Disp: , Rfl:    aspirin EC 81 MG tablet, Take 1 tablet (81 mg total) by mouth daily. Swallow whole., Disp: 30 tablet, Rfl: 11   Cholecalciferol (VITAMIN D3) 25 MCG (1000 UT) CAPS, Take 1 capsule (1,000 Units total) by mouth daily., Disp: 30 capsule, Rfl:    dexamethasone (DECADRON) 4 MG tablet, Take 5 tablets (20 mg total) by mouth once a week. Take with breakfast., Disp: 20 tablet, Rfl: 2   diltiazem (CARDIZEM CD) 180 MG 24 hr capsule, TAKE 1 CAPSULE BY MOUTH EVERY DAY, Disp: 90 capsule, Rfl: 3   ezetimibe (ZETIA)  10 MG tablet, Take 1 tablet (10 mg total) by mouth daily., Disp: 90 tablet, Rfl: 3   folic acid (FOLVITE) 1 MG tablet, Take 1 tablet (1 mg total) by mouth daily., Disp: 30 tablet, Rfl: 1   lenalidomide (REVLIMID) 10 MG capsule, Take 1 capsule (10 mg total) by mouth daily. Take for 21 days, then hold for 7 days. Repeat every 28 days REMS # [id number] on 02/04/2021, Disp: 21 capsule, Rfl: 0   polyethylene glycol (MIRALAX / GLYCOLAX) 17 g packet, Take 17 g by mouth daily., Disp: , Rfl:    propranolol (INDERAL) 40 MG tablet, TAKE 1 TABLET BY MOUTH TWICE A DAY, Disp: 180 tablet, Rfl: 1   sulfaSALAzine (AZULFIDINE) 500 MG tablet, TAKE 2 TABLETS BY MOUTH TWICE A DAY. *PLEASE KEEP SCHEDULED APPOINTMENT FOR FURTHER REFILLS.* (Patient taking differently: TAKE 2 TABLETS BY MOUTH TWICE A DAY. *PLEASE KEEP SCHEDULED APPOINTMENT FOR FURTHER REFILLS.* pt states taking 2 in morning and one in the evening.), Disp: 120 tablet, Rfl: 4   folic acid (FOLVITE) 1 MG tablet, Take 1 tablet (1 mg total) by mouth daily. (Patient not taking: No sig reported), Disp: , Rfl:    mesalamine (ROWASA) 4 g enema, PLACE 60 MLS (4 G TOTAL) RECTALLY AT BEDTIME. (Patient not taking: No sig  reported), Disp: 1800 mL, Rfl: 2   Multiple Vitamins-Minerals (CENTRUM SILVER) tablet, Take 1 tablet by mouth daily. (Patient not taking: No sig reported), Disp: , Rfl:    Wheat Dextrin (BENEFIBER PO), 1 tablespoon daily (Patient not taking: Reported on 02/25/2021), Disp: , Rfl:   Physical exam:  Vitals:   02/25/21 1320  BP: 123/85  Pulse: 85  Resp: 17  Temp: 98.6 F (37 C)  TempSrc: Tympanic  SpO2: 98%  Weight: 134 lb (60.8 kg)   Physical Exam Constitutional:      General: She is not in acute distress.    Comments: Sitting in wheelchair.  Appears in no acute distress.  She is elderly and frail  Cardiovascular:     Rate and Rhythm: Normal rate and regular rhythm.     Heart sounds: Normal heart sounds.  Pulmonary:     Effort: Pulmonary effort is normal.     Breath sounds: Normal breath sounds.  Abdominal:     General: Bowel sounds are normal.     Palpations: Abdomen is soft.  Skin:    General: Skin is warm and dry.  Neurological:     Mental Status: She is alert and oriented to person, place, and time.     CMP Latest Ref Rng & Units 02/25/2021  Glucose 70 - 99 mg/dL 115(H)  BUN 8 - 23 mg/dL 28(H)  Creatinine 0.44 - 1.00 mg/dL 1.54(H)  Sodium 135 - 145 mmol/L 136  Potassium 3.5 - 5.1 mmol/L 4.2  Chloride 98 - 111 mmol/L 102  CO2 22 - 32 mmol/L 28  Calcium 8.9 - 10.3 mg/dL 8.1(L)  Total Protein 6.5 - 8.1 g/dL 4.8(L)  Total Bilirubin 0.3 - 1.2 mg/dL 0.9  Alkaline Phos 38 - 126 U/L 102  AST 15 - 41 U/L 49(H)  ALT 0 - 44 U/L 59(H)   CBC Latest Ref Rng & Units 02/25/2021  WBC 4.0 - 10.5 K/uL 17.4(H)  Hemoglobin 12.0 - 15.0 g/dL 9.2(L)  Hematocrit 36.0 - 46.0 % 27.6(L)  Platelets 150 - 400 K/uL 208    Assessment and plan- Patient is a 87 y.o. female with kappa light chain multiple myeloma on Revlimid here for routine follow-up  Patient's multiple myeloma was diagnosed purely on the basis ofFree light chain ratio which was greater than 100.  It has come down from 7  49-9.8 a month ago.  Myeloma panel has never shown M protein.  She therefore has free light chain multiple myeloma.  Labs from today are pending.  Ideally patient needs a bone marrow biopsy which she is refusing.  I have started the patient on low-dose Revlimid 10 mg 3 weeks on 1 week off and she is also on Decadron 20 mg weekly.  She is on aspirin for thromboprophylaxis.  After starting Revlimid her hemoglobin has drifted down from a baseline of 11-9.2.  We did do anemia work-up today which shows a low folate level and we will be sending her a prescription for folic acid1 mg daily.  Iron studies are indicated of anemia of chronic disease and B12 levels are pending.  Serum creatinine fluctuates between 1.3-1.5 and is overall stable.  We will continue to monitor ideally patient needs bisphosphonates for her bony lesions which the patient continues to refuse  Mildly elevated AST ALT which we will monitor for now.  Patient is going for a second opinion to see Dr. Marin Olp later this week.  I will see him back in 1 month with CBC with differential CMP myeloma panel and serum free light chains  Leukocytosis predominantly neutrophilia likely secondary to steroid use.  I will consider reducing the dose Decadron to 10 mg weekly down the line.  Clinically patient does not have any signs of infection   Visit Diagnosis 1. Multiple myeloma not having achieved remission ([place])   2. High risk medication use      Dr. Randa Evens, MD, MPH [facility] [phone] 02/25/2021 3:24 PM

## 2021-02-25 NOTE — Progress Notes (Signed)
Done, patient verbalizes understanding

## 2021-02-26 LAB — KAPPA/LAMBDA LIGHT CHAINS
Kappa free light chain: 27.1 mg/L — ABNORMAL HIGH (ref 3.3–19.4)
Kappa, lambda light chain ratio: 1.98 — ABNORMAL HIGH (ref 0.26–1.65)
Lambda free light chains: 13.7 mg/L (ref 5.7–26.3)

## 2021-02-27 ENCOUNTER — Inpatient Hospital Stay (HOSPITAL_BASED_OUTPATIENT_CLINIC_OR_DEPARTMENT_OTHER): Payer: Medicare Other | Admitting: Hematology & Oncology

## 2021-02-27 ENCOUNTER — Other Ambulatory Visit: Payer: Self-pay

## 2021-02-27 ENCOUNTER — Inpatient Hospital Stay: Payer: Medicare Other | Attending: Hematology & Oncology

## 2021-02-27 ENCOUNTER — Encounter: Payer: Self-pay | Admitting: Hematology & Oncology

## 2021-02-27 VITALS — BP 100/48 | HR 81 | Temp 98.7°F | Resp 18 | Wt 134.0 lb

## 2021-02-27 DIAGNOSIS — D649 Anemia, unspecified: Secondary | ICD-10-CM

## 2021-02-27 DIAGNOSIS — C9000 Multiple myeloma not having achieved remission: Secondary | ICD-10-CM | POA: Insufficient documentation

## 2021-02-27 LAB — CBC WITH DIFFERENTIAL (CANCER CENTER ONLY)
Abs Immature Granulocytes: 0.1 10*3/uL — ABNORMAL HIGH (ref 0.00–0.07)
Basophils Absolute: 0.2 10*3/uL — ABNORMAL HIGH (ref 0.0–0.1)
Basophils Relative: 1 %
Eosinophils Absolute: 1 10*3/uL — ABNORMAL HIGH (ref 0.0–0.5)
Eosinophils Relative: 8 %
HCT: 28 % — ABNORMAL LOW (ref 36.0–46.0)
Hemoglobin: 9.1 g/dL — ABNORMAL LOW (ref 12.0–15.0)
Immature Granulocytes: 1 %
Lymphocytes Relative: 12 %
Lymphs Abs: 1.5 10*3/uL (ref 0.7–4.0)
MCH: 32.3 pg (ref 26.0–34.0)
MCHC: 32.5 g/dL (ref 30.0–36.0)
MCV: 99.3 fL (ref 80.0–100.0)
Monocytes Absolute: 1.4 10*3/uL — ABNORMAL HIGH (ref 0.1–1.0)
Monocytes Relative: 11 %
Neutro Abs: 8.7 10*3/uL — ABNORMAL HIGH (ref 1.7–7.7)
Neutrophils Relative %: 67 %
Platelet Count: 194 10*3/uL (ref 150–400)
RBC: 2.82 MIL/uL — ABNORMAL LOW (ref 3.87–5.11)
RDW: 16.5 % — ABNORMAL HIGH (ref 11.5–15.5)
WBC Count: 12.9 10*3/uL — ABNORMAL HIGH (ref 4.0–10.5)
nRBC: 0 % (ref 0.0–0.2)

## 2021-02-27 LAB — LACTATE DEHYDROGENASE: LDH: 163 U/L (ref 98–192)

## 2021-02-27 LAB — CMP (CANCER CENTER ONLY)
ALT: 87 U/L — ABNORMAL HIGH (ref 0–44)
AST: 40 U/L (ref 15–41)
Albumin: 3.4 g/dL — ABNORMAL LOW (ref 3.5–5.0)
Alkaline Phosphatase: 120 U/L (ref 38–126)
Anion gap: 7 (ref 5–15)
BUN: 25 mg/dL — ABNORMAL HIGH (ref 8–23)
CO2: 27 mmol/L (ref 22–32)
Calcium: 9.1 mg/dL (ref 8.9–10.3)
Chloride: 103 mmol/L (ref 98–111)
Creatinine: 1.27 mg/dL — ABNORMAL HIGH (ref 0.44–1.00)
GFR, Estimated: 42 mL/min — ABNORMAL LOW (ref >60)
Glucose, Bld: 110 mg/dL — ABNORMAL HIGH (ref 70–99)
Potassium: 4.9 mmol/L (ref 3.5–5.1)
Sodium: 137 mmol/L (ref 135–145)
Total Bilirubin: 0.7 mg/dL (ref 0.3–1.2)
Total Protein: 5.6 g/dL — ABNORMAL LOW (ref 6.5–8.1)

## 2021-02-28 ENCOUNTER — Encounter: Payer: Self-pay | Admitting: Oncology

## 2021-02-28 LAB — IGG, IGA, IGM
IgA: 78 mg/dL (ref 64–422)
IgG (Immunoglobin G), Serum: 322 mg/dL — ABNORMAL LOW (ref 586–1602)
IgM (Immunoglobulin M), Srm: 15 mg/dL — ABNORMAL LOW (ref 26–217)

## 2021-02-28 LAB — BETA 2 MICROGLOBULIN, SERUM: Beta-2 Microglobulin: 3.6 mg/L — ABNORMAL HIGH (ref 0.6–2.4)

## 2021-02-28 NOTE — Progress Notes (Signed)
Hematology and Oncology Follow Up Visit  Diana Brewer [id number] 12/06/1933 87 y.o. 02/28/2021   Principle Diagnosis:  Kappa light chain myeloma  Current Therapy:   Revlimid 10 mg po q day (21/7)     Interim History:  Diana Brewer is does not think at [facility] for second opinion.  She is followed quite expertly and compassionately by Dr. Janese Banks at the [facility].  She was diagnosed back in the summer with Kappa light chain myeloma.  She was having problems for about a year.  She had spinal fractures.  I feel that she had "osteoporosis.".  She was subsequently found to have a incredibly high serum kappa light chain of 3813 mg/L.  Work-up showed no monoclonal spike in her blood.  She declined a bone marrow biopsy.  Patient had a PET scan which showed a hypermetabolic lytic lesion in the left acetabulum.  She was started on single agent Revlimid.  She was started in August 2022.  She has always had a very good response.  However, she just has felt horrible.  She felt very fatigued and weak.  She feels that the Revlimid has been doing this.  I realize that she is on a low-dose of Revlimid.  Her light chains are going down incredibly well.  Back in July, her serum light chain was 5618 mg/L.  In September, this was down to 125 mg/L.  Just last week, the level was down to 27 mg/L.  As expected, she has responded very well.  However she just does not like the way Revlimid makes her feel.  She just feels very tired.  She has no energy.  She has a decent appetite with no nausea or vomiting.  She was wondering if there is other options to take beside Revlimid.  She is anemic.  I am sure this is part of the Revlimid.  She also has some renal insufficiency.  She has had no obvious bleeding.  She has had no rashes.  She has had some leg swelling.  She has had no cough.  Currently, she is having pain.  This is over in the left hip area.  She is not on any  bisphosphonate or Xgeva.  According to Dr.Rao's note,  Diana Brewer does not wish to take any bisphosphonate.  I would have to say that currently, her performance status is ECOG 2-3.    Medications:  Current Outpatient Medications:    acetaminophen (TYLENOL) 500 MG tablet, Take 1 tablet (500 mg total) by mouth in the morning, at noon, and at bedtime., Disp: , Rfl:    aspirin EC 81 MG tablet, Take 1 tablet (81 mg total) by mouth daily. Swallow whole., Disp: 30 tablet, Rfl: 11   Cholecalciferol (VITAMIN D3) 25 MCG (1000 UT) CAPS, Take 1 capsule (1,000 Units total) by mouth daily., Disp: 30 capsule, Rfl:    dexamethasone (DECADRON) 4 MG tablet, Take 5 tablets (20 mg total) by mouth once a week. Take with breakfast., Disp: 20 tablet, Rfl: 2   diltiazem (CARDIZEM CD) 180 MG 24 hr capsule, TAKE 1 CAPSULE BY MOUTH EVERY DAY, Disp: 90 capsule, Rfl: 3   ezetimibe (ZETIA) 10 MG tablet, Take 1 tablet (10 mg total) by mouth daily., Disp: 90 tablet, Rfl: 3   folic acid (FOLVITE) 1 MG tablet, Take 1 tablet (1 mg total) by mouth daily. (Patient not taking: No sig reported), Disp: , Rfl:    folic acid (FOLVITE) 1 MG tablet, Take  1 tablet (1 mg total) by mouth daily., Disp: 30 tablet, Rfl: 1   lenalidomide (REVLIMID) 10 MG capsule, Take 1 capsule (10 mg total) by mouth daily. Take for 21 days, then hold for 7 days. Repeat every 28 days REMS # [id number] on 02/04/2021, Disp: 21 capsule, Rfl: 0   mesalamine (ROWASA) 4 g enema, PLACE 60 MLS (4 G TOTAL) RECTALLY AT BEDTIME. (Patient not taking: No sig reported), Disp: 1800 mL, Rfl: 2   Multiple Vitamins-Minerals (CENTRUM SILVER) tablet, Take 1 tablet by mouth daily. (Patient not taking: No sig reported), Disp: , Rfl:    polyethylene glycol (MIRALAX / GLYCOLAX) 17 g packet, Take 17 g by mouth daily., Disp: , Rfl:    propranolol (INDERAL) 40 MG tablet, TAKE 1 TABLET BY MOUTH TWICE A DAY, Disp: 180 tablet, Rfl: 1   sulfaSALAzine (AZULFIDINE) 500 MG tablet, TAKE 2 TABLETS BY  MOUTH TWICE A DAY. *PLEASE KEEP SCHEDULED APPOINTMENT FOR FURTHER REFILLS.* (Patient taking differently: TAKE 2 TABLETS BY MOUTH TWICE A DAY. *PLEASE KEEP SCHEDULED APPOINTMENT FOR FURTHER REFILLS.* pt states taking 2 in morning and one in the evening.), Disp: 120 tablet, Rfl: 4   Wheat Dextrin (BENEFIBER PO), 1 tablespoon daily (Patient not taking: Reported on 02/25/2021), Disp: , Rfl:   Allergies:  Allergies  Allergen Reactions   Tramadol Nausea Only   Codeine Other (See Comments)    nausea     Cortisone Swelling    Swelling    Guaifenesin Er Other (See Comments)    Doesn't work, made her sick    Past Medical History, Surgical history, Social history, and Family History were reviewed and updated.  Review of Systems: Review of Systems  Constitutional:  Negative for fatigue.  HENT:  Negative.    Eyes: Negative.   Respiratory:  Positive for shortness of breath.   Cardiovascular: Negative.   Gastrointestinal:  Positive for nausea.  Endocrine: Negative.   Musculoskeletal:  Positive for back pain.  Skin: Negative.   Neurological: Negative.   Hematological: Negative.   Psychiatric/Behavioral: Negative.     Physical Exam:  weight is 134 lb (60.8 kg). Her oral temperature is 98.7 F (37.1 C). Her blood pressure is 100/48 (abnormal) and her pulse is 81. Her respiration is 18 and oxygen saturation is 99%.   Wt Readings from Last 3 Encounters:  02/27/21 134 lb (60.8 kg)  02/25/21 134 lb (60.8 kg)  01/27/21 136 lb (61.7 kg)    Physical Exam Vitals reviewed.  HENT:     Head: Normocephalic and atraumatic.  Eyes:     Pupils: Pupils are equal, round, and reactive to light.  Cardiovascular:     Rate and Rhythm: Normal rate and regular rhythm.     Heart sounds: Normal heart sounds.  Pulmonary:     Effort: Pulmonary effort is normal.     Breath sounds: Normal breath sounds.  Abdominal:     General: Bowel sounds are normal.     Palpations: Abdomen is soft.  Musculoskeletal:         General: No tenderness or deformity. Normal range of motion.     Cervical back: Normal range of motion.  Lymphadenopathy:     Cervical: No cervical adenopathy.  Skin:    General: Skin is warm and dry.     Findings: No erythema or rash.  Neurological:     Mental Status: She is alert and oriented to person, place, and time.  Psychiatric:        Behavior: Behavior  normal.        Thought Content: Thought content normal.        Judgment: Judgment normal.     Lab Results  Component Value Date   WBC 12.9 (H) 02/27/2021   HGB 9.1 (L) 02/27/2021   HCT 28.0 (L) 02/27/2021   MCV 99.3 02/27/2021   PLT 194 02/27/2021     Chemistry      Component Value Date/Time   NA 137 02/27/2021 1404   NA 139 07/01/2020 0940   K 4.9 02/27/2021 1404   CL 103 02/27/2021 1404   CO2 27 02/27/2021 1404   BUN 25 (H) 02/27/2021 1404   BUN 23 07/01/2020 0940   CREATININE 1.27 (H) 02/27/2021 1404   CREATININE 1.24 (H) 03/10/2016 0814      Component Value Date/Time   CALCIUM 9.1 02/27/2021 1404   ALKPHOS 120 02/27/2021 1404   AST 40 02/27/2021 1404   ALT 87 (H) 02/27/2021 1404   BILITOT 0.7 02/27/2021 1404       Impression and Plan: Diana Brewer is a very charming 87 year old white female.  She has kappa light chain myeloma.  She is on single agent Revlimid which is worked incredibly well.  Her light chain level is come down upon 90%.  This is just a matter of 2-3 months.  Again she is on a low-dose of Revlimid.  This is certainly working.  I suppose the dose could be decreased even further.  If she wanted to stop Revlimid, she could do this and see how she feels after about 3 or 4 weeks.  I do not think would be a problem to stop the Revlimid.  I think another option for her would be single agent Velcade.  This would be subcutaneous.  This should have a decent chance of working.  Faspro could also be considered.  This is also subcutaneous.  This could be combined with the Velcade.  She could  always consider radiation therapy to the left hip area.  I do not think this would cause a lot of problems.  She can have 1 fraction that I think could be well-tolerated and could be effective.  I really do not see a reason why she should not take Xgeva.  I am unsure why she declined to take this.  She could also be transfused.  We can get her blood count back up and see if this may make her feel a little bit better.  I am sure the anemia is related to the Revlimid.  I spent a good hour or so with she and her daughter.  They are both very very nice.  I enjoyed talking with them both.  I told him to think about what she would like to do.  Again, a transfusion certainly would not be unreasonable to try to get her blood count back up to see if that would make her feel better.  Stopping the Revlimid right now would also not be a bad idea.  She will think about things and will let us know.  I do not think that there is really 1 right answer as to what she needs to do right now.  This is all about quality of life.  However her quality life can be improved with certainly make her feel better.   Volanda Napoleon, MD 10/22/202210:52 AM

## 2021-03-01 LAB — ERYTHROPOIETIN: Erythropoietin: 74.2 m[IU]/mL — ABNORMAL HIGH (ref 2.6–18.5)

## 2021-03-02 ENCOUNTER — Telehealth: Payer: Self-pay | Admitting: *Deleted

## 2021-03-02 LAB — PROTEIN ELECTROPHORESIS, SERUM, WITH REFLEX
A/G Ratio: 1.4 (ref 0.7–1.7)
Albumin ELP: 2.9 g/dL (ref 2.9–4.4)
Alpha-1-Globulin: 0.3 g/dL (ref 0.0–0.4)
Alpha-2-Globulin: 0.7 g/dL (ref 0.4–1.0)
Beta Globulin: 0.7 g/dL (ref 0.7–1.3)
Gamma Globulin: 0.3 g/dL — ABNORMAL LOW (ref 0.4–1.8)
Globulin, Total: 2.1 g/dL — ABNORMAL LOW (ref 2.2–3.9)
Total Protein ELP: 5 g/dL — ABNORMAL LOW (ref 6.0–8.5)

## 2021-03-02 LAB — KAPPA/LAMBDA LIGHT CHAINS
Kappa free light chain: 38.7 mg/L — ABNORMAL HIGH (ref 3.3–19.4)
Kappa, lambda light chain ratio: 2.05 — ABNORMAL HIGH (ref 0.26–1.65)
Lambda free light chains: 18.9 mg/L (ref 5.7–26.3)

## 2021-03-02 NOTE — Telephone Encounter (Signed)
No 02/27/21 LOS

## 2021-03-04 LAB — MULTIPLE MYELOMA PANEL, SERUM
Albumin SerPl Elph-Mcnc: 3 g/dL (ref 2.9–4.4)
Albumin/Glob SerPl: 1.7 (ref 0.7–1.7)
Alpha 1: 0.3 g/dL (ref 0.0–0.4)
Alpha2 Glob SerPl Elph-Mcnc: 0.6 g/dL (ref 0.4–1.0)
B-Globulin SerPl Elph-Mcnc: 0.7 g/dL (ref 0.7–1.3)
Gamma Glob SerPl Elph-Mcnc: 0.2 g/dL — ABNORMAL LOW (ref 0.4–1.8)
Globulin, Total: 1.8 g/dL — ABNORMAL LOW (ref 2.2–3.9)
IgA: 50 mg/dL — ABNORMAL LOW (ref 64–422)
IgG (Immunoglobin G), Serum: 312 mg/dL — ABNORMAL LOW (ref 586–1602)
IgM (Immunoglobulin M), Srm: 13 mg/dL — ABNORMAL LOW (ref 26–217)
Total Protein ELP: 4.8 g/dL — ABNORMAL LOW (ref 6.0–8.5)

## 2021-03-06 ENCOUNTER — Telehealth: Payer: Self-pay

## 2021-03-06 ENCOUNTER — Other Ambulatory Visit: Payer: Self-pay | Admitting: *Deleted

## 2021-03-06 DIAGNOSIS — C9000 Multiple myeloma not having achieved remission: Secondary | ICD-10-CM

## 2021-03-06 NOTE — Telephone Encounter (Signed)
Ok to cancel at this time. Thank you.

## 2021-03-06 NOTE — Telephone Encounter (Signed)
Noted! Thank you

## 2021-03-06 NOTE — Telephone Encounter (Signed)
Dr Darnell Level, wanted to follow up on this patient. Patient was on hold for Prolia due to seen oncologist for new diagnoses. Patient never had Prolia injections and this was going to be a new start from back in April 2022.  Do I need to cancel this for the patient with her current health situation or ask oncologist about this or any other feedback? Thank you

## 2021-03-09 ENCOUNTER — Telehealth: Payer: Self-pay

## 2021-03-09 NOTE — Telephone Encounter (Signed)
Patient would like for Dr. Janese Banks to know that she did see Dr. Marin Olp about a week ago for 2nd opinion and he recommend she stop the Revlimid.  She has stopped the Revlimid but wanted to check with Dr. Janese Banks if OK to stop until her next appointment on 03/27/21.    Also wants to know if OK to take folic acid 021 mcg instead of the 1gm as avised on recent lab results.  She already had the folic acid 115ZMC available at home.

## 2021-03-09 NOTE — Telephone Encounter (Signed)
Tried calling patient to let her know of MD response but line is busy.

## 2021-03-09 NOTE — Telephone Encounter (Signed)
Ok to take folic acid 929 mcg. If she wants to stop revlimid and discuss it on 11/18 that's fine with me

## 2021-03-11 ENCOUNTER — Encounter: Payer: Self-pay | Admitting: Oncology

## 2021-03-11 NOTE — Telephone Encounter (Signed)
Revlimid is on hold at this time per Burna Sis, RN patient is deciding is she wants to continue Revlimid or change treatment to Velcade. Notified [facility] of this

## 2021-03-12 ENCOUNTER — Telehealth: Payer: Self-pay | Admitting: *Deleted

## 2021-03-12 NOTE — Telephone Encounter (Signed)
Diana Brewer was able to get in touch with pt and she wants to take folic acid 269SWN a day.

## 2021-03-12 NOTE — Telephone Encounter (Signed)
Patient called yesterday and she was speaking about her problem. She states that she ate scrambled eggs and a croissant and she finished it. She felt that something was hung in throat. She coughed several times and nothing happened. Then later she has small amount yellow up. It was smaller than finger nail. She does not know what happened but she felt better in the day. I sent a secure chat to Janese Banks and she answered it . It is hard to say what went on and what the yellow liq. Was. Pt has had trouble at times with food before. Long time in between the episodes. I told her that Dr. Janese Banks feels she should see her PCP. She does not feel that this is a result of her multiple myeloma or the revlimid she was on. I told her that when people have troubles with swallowing going to gi is good idea because they can look down her throat with endoscopy to see if there are problems in esophagus. They would also be a good MD to talk about if she thinks it is reflux. At this time she does not feel like going anywhere. She is a tired and when she goes somewhere it wears her out for the whole day. She will be here 11/18 for her appt. I told her that dr Janese Banks was willing to put her on lower dose of revlimid if she wanted to try that. She can talk to rao at her next visit

## 2021-03-23 ENCOUNTER — Other Ambulatory Visit: Payer: Self-pay | Admitting: Oncology

## 2021-03-23 DIAGNOSIS — C9000 Multiple myeloma not having achieved remission: Secondary | ICD-10-CM

## 2021-03-27 ENCOUNTER — Encounter: Payer: Self-pay | Admitting: Oncology

## 2021-03-27 ENCOUNTER — Other Ambulatory Visit: Payer: Self-pay

## 2021-03-27 ENCOUNTER — Inpatient Hospital Stay: Payer: Medicare Other | Admitting: Oncology

## 2021-03-27 ENCOUNTER — Inpatient Hospital Stay: Payer: Medicare Other | Attending: Oncology

## 2021-03-27 DIAGNOSIS — C9000 Multiple myeloma not having achieved remission: Secondary | ICD-10-CM | POA: Insufficient documentation

## 2021-03-27 DIAGNOSIS — Z79899 Other long term (current) drug therapy: Secondary | ICD-10-CM

## 2021-03-27 LAB — CBC WITH DIFFERENTIAL/PLATELET
Abs Immature Granulocytes: 0.09 10*3/uL — ABNORMAL HIGH (ref 0.00–0.07)
Basophils Absolute: 0 10*3/uL (ref 0.0–0.1)
Basophils Relative: 0 %
Eosinophils Absolute: 0.3 10*3/uL (ref 0.0–0.5)
Eosinophils Relative: 3 %
HCT: 29.4 % — ABNORMAL LOW (ref 36.0–46.0)
Hemoglobin: 9.4 g/dL — ABNORMAL LOW (ref 12.0–15.0)
Immature Granulocytes: 1 %
Lymphocytes Relative: 15 %
Lymphs Abs: 1.8 10*3/uL (ref 0.7–4.0)
MCH: 33.2 pg (ref 26.0–34.0)
MCHC: 32 g/dL (ref 30.0–36.0)
MCV: 103.9 fL — ABNORMAL HIGH (ref 80.0–100.0)
Monocytes Absolute: 1.1 10*3/uL — ABNORMAL HIGH (ref 0.1–1.0)
Monocytes Relative: 10 %
Neutro Abs: 8.1 10*3/uL — ABNORMAL HIGH (ref 1.7–7.7)
Neutrophils Relative %: 71 %
Platelets: 260 10*3/uL (ref 150–400)
RBC: 2.83 MIL/uL — ABNORMAL LOW (ref 3.87–5.11)
RDW: 17.8 % — ABNORMAL HIGH (ref 11.5–15.5)
WBC: 11.4 10*3/uL — ABNORMAL HIGH (ref 4.0–10.5)
nRBC: 0 % (ref 0.0–0.2)

## 2021-03-27 LAB — COMPREHENSIVE METABOLIC PANEL
ALT: 23 U/L (ref 0–44)
AST: 17 U/L (ref 15–41)
Albumin: 3.1 g/dL — ABNORMAL LOW (ref 3.5–5.0)
Alkaline Phosphatase: 86 U/L (ref 38–126)
Anion gap: 9 (ref 5–15)
BUN: 24 mg/dL — ABNORMAL HIGH (ref 8–23)
CO2: 25 mmol/L (ref 22–32)
Calcium: 8.3 mg/dL — ABNORMAL LOW (ref 8.9–10.3)
Chloride: 104 mmol/L (ref 98–111)
Creatinine, Ser: 1.27 mg/dL — ABNORMAL HIGH (ref 0.44–1.00)
GFR, Estimated: 41 mL/min — ABNORMAL LOW (ref >60)
Glucose, Bld: 128 mg/dL — ABNORMAL HIGH (ref 70–99)
Potassium: 4.3 mmol/L (ref 3.5–5.1)
Sodium: 138 mmol/L (ref 135–145)
Total Bilirubin: 0.4 mg/dL (ref 0.3–1.2)
Total Protein: 4.9 g/dL — ABNORMAL LOW (ref 6.5–8.1)

## 2021-03-27 NOTE — Progress Notes (Signed)
Tired, and swallowing issues of some things that gets stuck. taste alteration

## 2021-03-27 NOTE — Progress Notes (Signed)
Hematology/Oncology Consult note [facility]  [phone] [phone]  Patient Care Team: [name], MD as PCP - General (Family Medicine) Troy Sine, MD as PCP - Cardiology (Cardiology) [name], MD as Consulting Physician (Hematology and Oncology)   Name of the patient: Diana Brewer  [id number]  Oct 26, 1933   Date of visit: 03/27/21  Diagnosis-Kappa light chain multiple myeloma  Chief complaint/ Reason for visit-routine follow-up of multiple myeloma  Heme/Onc history:  Patient is a 87 year old female who has been having ongoing back pain for the last 1 year.  She underwent x-ray of her thoracic and lumbar spine which showed T9 inferior endplate fracture as well as remote T5 T7 and T11 fractures.  She was seen by Dr. Lacinda Axon from neurosurgery and was recommended medical management for her osteoporosis.  Her mobility has been affected because of her back pain.  She is mostly sedentary and will walk for short distances.  She had some work-up done by Dr. Danise Mina in June 2022 which showed elevated kappa free light chain of 3813 and ratio of 302.  Myeloma panel showed no M protein.   Patient refused to undergo bone marrow biopsy.PET CT scan showed lytic mottling lesions throughout the visualized osseous structures with single hypermetabolic lytic lesion in the medial left acetabulum concerning for multiple myeloma.  Hypermetabolic rounded soft tissue extending from L5-S1 disc space.  Mild bilateral adrenal hypermetabolism without CT correlate.  Focal hypermetabolism in the rectosigmoid colon without CT correlate.   Patient started on Revlimid in August 2022.  Diagnosis of multiple myeloma was made purely on the basis of free light chain ratio that was more than 100.  She continues to refuse additional treatments, bone marrow biopsy and bisphosphonates    Interval history-patient met with Dr. Marin Olp for second opinion who  discussed various options including stopping Revlimid versus continuing Revlimid versus adding Velcade or daratumumab as well as bisphosphonates.  Patient presently stopped taking Revlimid for the last 2 weeks.  After starting treatment for myeloma patient does report improvement in her back pain to the point that she is able to take a few steps as well as sit more comfortably unlike before.  She still does not want to take any bisphosphonates.  ECOG PS- 3 Pain scale- 2   Review of systems- Review of Systems  Constitutional:  Negative for chills, fever, malaise/fatigue and weight loss.  HENT:  Negative for congestion, ear discharge and nosebleeds.   Eyes:  Negative for blurred vision.  Respiratory:  Negative for cough, hemoptysis, sputum production, shortness of breath and wheezing.   Cardiovascular:  Negative for chest pain, palpitations, orthopnea and claudication.  Gastrointestinal:  Negative for abdominal pain, blood in stool, constipation, diarrhea, heartburn, melena, nausea and vomiting.  Genitourinary:  Negative for dysuria, flank pain, frequency, hematuria and urgency.  Musculoskeletal:  Negative for back pain, joint pain and myalgias.  Skin:  Negative for rash.  Neurological:  Negative for dizziness, tingling, focal weakness, seizures, weakness and headaches.  Endo/Heme/Allergies:  Does not bruise/bleed easily.  Psychiatric/Behavioral:  Negative for depression and suicidal ideas. The patient does not have insomnia.       Allergies  Allergen Reactions   Tramadol Nausea Only   Codeine Other (See Comments)    nausea     Cortisone Swelling    Swelling    Guaifenesin Er Other (See Comments)    Doesn't work, made her sick     Past Medical History:  Diagnosis  Date   Arthritis 2016   R wrist predominantly radius scaphoid and midcarpal [name]   History of migraine remote   History of shingles    HLD (hyperlipidemia)    Hypertension    Macular degeneration     [name]   Multiple myeloma (HCC)    MVP (mitral valve prolapse)    Palpitations    Postherpetic neuralgia 12/27/2012   Ulcerative colitis ([place])    left sided - proctosigmoiditis ([name])     Past Surgical History:  Procedure Laterality Date   BREAST BIOPSY  1975   COLONOSCOPY  2011   [facility]   TRACHEOSTOMY  1942   due to Strep Throat (?epiglottitis)    Social History   Socioeconomic History   Marital status: Married    Spouse name: Not on file   Number of children: Not on file   Years of education: Not on file   Highest education level: Not on file  Occupational History   Not on file  Tobacco Use   Smoking status: Never   Smokeless tobacco: Never  Vaping Use   Vaping Use: Never used  Substance and Sexual Activity   Alcohol use: No   Drug use: No   Sexual activity: Never  Other Topics Concern   Not on file  Social History Narrative   Lives with husband [name])   [place] child (daughter)   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[name]   Activity: walks and active in yard, grows vegetables   Diet: good water, good fruits/vegetables daily   Social Determinants of Radio broadcast assistant Strain: Low Risk    Difficulty of Paying Living Expenses: Not hard at all  Food Insecurity: No Food Insecurity   Worried About Charity fundraiser in the Last Year: Never true   Arboriculturist in the Last Year: Never true  Transportation Needs: No Transportation Needs   Lack of Transportation (Medical): No   Lack of Transportation (Non-Medical): No  Physical Activity: Inactive   Days of Exercise per Week: 0 days   Minutes of Exercise per Session: 0 min  Stress: No Stress Concern Present   Feeling of Stress : Not at all  Social Connections: Not on file  Intimate Partner Violence: Not At Risk   Fear of Current or Ex-Partner: No   Emotionally Abused: No   Physically Abused: No   Sexually Abused: No    Family History  Problem Relation Age of Onset    Hypertension Mother    Stroke Mother    Heart disease Mother    Rheum arthritis Father    Melanoma Father    Melanoma Brother    Lung cancer Paternal Grandfather    Colon cancer Neg Hx    Esophageal cancer Neg Hx    Pancreatic cancer Neg Hx    Stomach cancer Neg Hx      Current Outpatient Medications:    acetaminophen (TYLENOL) 500 MG tablet, Take 1 tablet (500 mg total) by mouth in the morning, at noon, and at bedtime., Disp: , Rfl:    aspirin EC 81 MG tablet, Take 1 tablet (81 mg total) by mouth daily. Swallow whole., Disp: 30 tablet, Rfl: 11   Cholecalciferol (VITAMIN D3) 25 MCG (1000 UT) CAPS, Take 1 capsule (1,000 Units total) by mouth daily., Disp: 30 capsule, Rfl:    dexamethasone (DECADRON) 4 MG tablet, TAKE 5 TABLETS (20 MG TOTAL) BY MOUTH ONCE A WEEK. TAKE WITH BREAKFAST., Disp: 20  tablet, Rfl: 2   diltiazem (CARDIZEM CD) 180 MG 24 hr capsule, TAKE 1 CAPSULE BY MOUTH EVERY DAY, Disp: 90 capsule, Rfl: 3   ezetimibe (ZETIA) 10 MG tablet, Take 1 tablet (10 mg total) by mouth daily., Disp: 90 tablet, Rfl: 3   folic acid (FOLVITE) 1 MG tablet, Take 1 tablet (1 mg total) by mouth daily. (Patient not taking: No sig reported), Disp: , Rfl:    folic acid (FOLVITE) 1 MG tablet, Take 1 tablet (1 mg total) by mouth daily., Disp: 30 tablet, Rfl: 1   lenalidomide (REVLIMID) 10 MG capsule, Take 1 capsule (10 mg total) by mouth daily. Take for 21 days, then hold for 7 days. Repeat every 28 days REMS # [id number] on 02/04/2021, Disp: 21 capsule, Rfl: 0   mesalamine (ROWASA) 4 g enema, PLACE 60 MLS (4 G TOTAL) RECTALLY AT BEDTIME. (Patient not taking: No sig reported), Disp: 1800 mL, Rfl: 2   Multiple Vitamins-Minerals (CENTRUM SILVER) tablet, Take 1 tablet by mouth daily. (Patient not taking: No sig reported), Disp: , Rfl:    polyethylene glycol (MIRALAX / GLYCOLAX) 17 g packet, Take 17 g by mouth daily., Disp: , Rfl:    propranolol (INDERAL) 40 MG tablet, TAKE 1 TABLET BY MOUTH TWICE A DAY, Disp:  180 tablet, Rfl: 1   sulfaSALAzine (AZULFIDINE) 500 MG tablet, TAKE 2 TABLETS BY MOUTH TWICE A DAY. *PLEASE KEEP SCHEDULED APPOINTMENT FOR FURTHER REFILLS.* (Patient taking differently: TAKE 2 TABLETS BY MOUTH TWICE A DAY. *PLEASE KEEP SCHEDULED APPOINTMENT FOR FURTHER REFILLS.* pt states taking 2 in morning and one in the evening.), Disp: 120 tablet, Rfl: 4   Wheat Dextrin (BENEFIBER PO), 1 tablespoon daily (Patient not taking: Reported on 02/25/2021), Disp: , Rfl:   Physical exam: There were no vitals filed for this visit. Physical Exam HENT:     Head: Normocephalic and atraumatic.  Eyes:     Pupils: Pupils are equal, round, and reactive to light.  Cardiovascular:     Rate and Rhythm: Normal rate and regular rhythm.     Heart sounds: Normal heart sounds.  Pulmonary:     Effort: Pulmonary effort is normal.     Breath sounds: Normal breath sounds.  Abdominal:     General: Bowel sounds are normal.     Palpations: Abdomen is soft.  Musculoskeletal:     Cervical back: Normal range of motion.  Skin:    General: Skin is warm and dry.  Neurological:     Mental Status: She is alert and oriented to person, place, and time.     CMP Latest Ref Rng & Units 02/27/2021  Glucose 70 - 99 mg/dL 110(H)  BUN 8 - 23 mg/dL 25(H)  Creatinine 0.44 - 1.00 mg/dL 1.27(H)  Sodium 135 - 145 mmol/L 137  Potassium 3.5 - 5.1 mmol/L 4.9  Chloride 98 - 111 mmol/L 103  CO2 22 - 32 mmol/L 27  Calcium 8.9 - 10.3 mg/dL 9.1  Total Protein 6.5 - 8.1 g/dL 5.6(L)  Total Bilirubin 0.3 - 1.2 mg/dL 0.7  Alkaline Phos 38 - 126 U/L 120  AST 15 - 41 U/L 40  ALT 0 - 44 U/L 87(H)   CBC Latest Ref Rng & Units 02/27/2021  WBC 4.0 - 10.5 K/uL 12.9(H)  Hemoglobin 12.0 - 15.0 g/dL 9.1(L)  Hematocrit 36.0 - 46.0 % 28.0(L)  Platelets 150 - 400 K/uL 194     Assessment and plan- Patient is a 87 y.o. female with history of kappa light  chain multiple myeloma here for routine follow-up  We again discussed various treatment  options for multiple myeloma.  Her myeloma has been slowly diagnosed on the basis of free light chain ratio that was more than 100.  Patient refused bone marrow biopsy.  In terms of further treatment options for myeloma we could restart Revlimid at a lower doseOff 10 mg instead of 15 mg given that her anemia did worsen after she started taking Revlimid.  I do not feel that Velcade would be a long-term good option for the patient given the potential for peripheral neuropathy as well as the need for weekly subcutaneous injections.  Daratumumab would also require her to come for weekly injections.  Patient agrees to restart Revlimid at this time at a lower dose of 10 mg.  She has had a dramatic response to Revlimid and her free light chain ratio has now normalized to 1.6 from a prior ratio of 749 3 months ago.  Kappa light chain is mildly elevated at 20 as compared to 5618 3 months ago.  She will also take Decadron on a weekly basis and we can reduce the dose to 10 mg instead of 20 mg.  Patient will continue low-dose aspirin while she is on Revlimid  CBC with differential, CMP, serum free light chains in 1 month in 2 months.  See covering NP in 1 month and see Dr. Janese Banks in 2 months   Visit Diagnosis 1. Multiple myeloma not having achieved remission ([place])      Dr. Randa Evens, MD, MPH [facility] [phone] 03/27/2021 1:16 PM

## 2021-03-30 LAB — KAPPA/LAMBDA LIGHT CHAINS
Kappa free light chain: 20 mg/L — ABNORMAL HIGH (ref 3.3–19.4)
Kappa, lambda light chain ratio: 1.6 (ref 0.26–1.65)
Lambda free light chains: 12.5 mg/L (ref 5.7–26.3)

## 2021-03-31 LAB — MULTIPLE MYELOMA PANEL, SERUM
Albumin SerPl Elph-Mcnc: 3 g/dL (ref 2.9–4.4)
Albumin/Glob SerPl: 1.6 (ref 0.7–1.7)
Alpha 1: 0.3 g/dL (ref 0.0–0.4)
Alpha2 Glob SerPl Elph-Mcnc: 0.6 g/dL (ref 0.4–1.0)
B-Globulin SerPl Elph-Mcnc: 0.8 g/dL (ref 0.7–1.3)
Gamma Glob SerPl Elph-Mcnc: 0.3 g/dL — ABNORMAL LOW (ref 0.4–1.8)
Globulin, Total: 2 g/dL — ABNORMAL LOW (ref 2.2–3.9)
IgA: 81 mg/dL (ref 64–422)
IgG (Immunoglobin G), Serum: 333 mg/dL — ABNORMAL LOW (ref 586–1602)
IgM (Immunoglobulin M), Srm: 21 mg/dL — ABNORMAL LOW (ref 26–217)
Total Protein ELP: 5 g/dL — ABNORMAL LOW (ref 6.0–8.5)

## 2021-04-03 ENCOUNTER — Encounter: Payer: Self-pay | Admitting: Oncology

## 2021-04-06 ENCOUNTER — Other Ambulatory Visit: Payer: Self-pay | Admitting: *Deleted

## 2021-04-06 MED ORDER — LENALIDOMIDE 5 MG PO CAPS: 5.0000 mg | ORAL_CAPSULE | Freq: Every day | ORAL | 0 refills | Status: DC

## 2021-04-07 ENCOUNTER — Encounter: Payer: Self-pay | Admitting: Cardiovascular Disease

## 2021-04-07 ENCOUNTER — Other Ambulatory Visit: Payer: Self-pay

## 2021-04-07 ENCOUNTER — Ambulatory Visit: Payer: Medicare Other | Admitting: Cardiovascular Disease

## 2021-04-07 VITALS — BP 126/64 | HR 64 | Wt 136.0 lb

## 2021-04-07 DIAGNOSIS — I480 Paroxysmal atrial fibrillation: Secondary | ICD-10-CM

## 2021-04-07 NOTE — Patient Instructions (Signed)
Medication Instructions:  Your Physician recommend you continue on your current medication as directed.    *If you need a refill on your cardiac medications before your next appointment, please call your pharmacy*   Lab Work: None ordered today   Testing/Procedures: None ordered today   Follow-Up: At [facility], you and your health needs are our priority.  As part of our continuing mission to provide you with exceptional heart care, we have created designated Provider Care Teams.  These Care Teams include your primary Cardiologist (physician) and Advanced Practice Providers (APPs -  Physician Assistants and Nurse Practitioners) who all work together to provide you with the care you need, when you need it.  We recommend signing up for the patient portal called "MyChart".  Sign up information is provided on this After Visit Summary.  MyChart is used to connect with patients for Virtual Visits (Telemedicine).  Patients are able to view lab/test results, encounter notes, upcoming appointments, etc.  Non-urgent messages can be sent to your provider as well.   To learn more about what you can do with MyChart, go to [email or web address].    Your next appointment:   1 year(s)  The format for your next appointment:   In Person  Provider:   [name], MD

## 2021-04-14 ENCOUNTER — Encounter: Payer: Self-pay | Admitting: Cardiovascular Disease

## 2021-04-14 NOTE — Progress Notes (Signed)
Cardiology Office Note    Date:  04/14/2021   ID:  Diana Brewer, DOB 02-Jan-1934, MRN [id number]  PCP:  Diana Bush, MD  Cardiologist:  Diana Majestic, MD   F/U evaluation  History of Present Illness:  Diana Brewer is a 87 y.o. female who has been followed by Diana Brewer almost 30 years.  I have cared for several of her family members. She established cardiology care with me in October 2017.  She presents for a 9 -month follow-up evaluation.  Diana Brewer has a long-standing history of hypertension, as well as mitral valve prolapse.  She has a history of hyperlipidemia, as well as chronic inflammatory proctitis.  She has been maintained on propranolol 20 mg twice a day for palpitations and at times has to take extra pill for breakthrough ectopy.  She has complained that her weight has been gradually increasing.  Diana Brewer had previously checked a TSH.  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  She denies any recent episodes of chest pain.  She denies any change in development of shortness of breath.    When I saw her, since she was taken propranolol 20 mg twice a day, but at times needed additional doses due to recurrent palpitations  I suggested substituting this for Toprol-XL 25 mg daily with plans to titrate as needed.  However, on the metoprolol preparation she felt that she developed some recurrent migraine headaches, which she had not experienced while she was taken propranolol.  She was switched back to propanolol 40 mg bid which has beneficial with reference to her migraine headaches on this increased beta blocker dose.  Her palpitations also have improved. When  I initially evaluated her, she had a soft carotid bruit on physical exam.  She underwent carotid duplex imaging on 07/21/2016.  This revealed mild heterogenous plaque bilaterally with narrowings in the 1-39% range.  She had normal subclavian and  vertebral arteries bilaterally.  Laboratory in August 2017 had shown an LDL cholesterol of 110 and  in February 2017 LDL was 138.    When I saw her in March 2018, I reviewed recent subclinical atherosclerosis date on a systematic patients without cardiac risk factors.  His cast development of sclerosis which was found to develop even at LDLs, beginning at 60.  She was hasn't to try a statin.  I instituted Zetia 10 mg.  She has tolerated this well.  She underwent recent laboratory which now showed improvement in her lipid studies such that total cholesterol was 143, triglycerides 96, HDL 52, LDL 72.  Creatinine was 1.24 which had improved from 1.37.    I saw her in April 2019 at which time she denied any chest pain or palpitations.  She continued to take propranolol 40 mg twice a day and was taking HCTZ 12.5 mg every other day.  Apparently, she has been cutting her Zetia 10 mg in half.  Laboratory one week previously has shown a creatinine of 1.25 which was slightly improved from 2 weeks ago when it was 1.41.  At that time, her HCTZ was reduced to every other day. Lipid studies have shown her LDL cholesterol increased to 97 when 7 months ago it was 72.   When I saw her in September 2020 she was no longer taking hydrochlorothiazide.  Her palpitations remained controlled on propranolol 40 mg twice a day.  She continues to take Zetia 10 mg for hyperlipidemia for  her subclinical atherosclerosis.  She is not exercising as much as he had in the past due to the Covid pandemic.  She denied chest pain, PND, orthopnea, presyncope or syncope.   I saw her in September 2021.  Over the prior year she developed Covid in November/December 2020.  She also had a T7 thoracic fracture several months ago.  She  had decreased energy since her Covid infection.  She states her blood pressure at home typically runs around 115 to less than 412 systolically.  She denies any chest pain.  She had recent laboratory which showed her LDL  cholesterol at 106, creatinine 1.38, potassium 5.3, TSH 6.6.  We discussed potassium avoidance and suggested follow-up potassium level in 1 week.  She had remained stable and has seen Dr. Danise Brewer for primary care.  She had developed some vague back discomfort over the weekend with ultimately improved plan to see her primary MD but because of a 1 day history of mild temperature elevation she was advised to go to urgent care.  Apparently she was in atrial fibrillation for which she was asymptomatic.  She was transferred to [facility] ER.  She was given a Cardizem bolus and drip and converted to sinus rhythm and as result was not admitted.  There was discussion concerning initiation of anticoagulation but with the patient's history of ulcerative proctitis with GI bleeds in the past she preferred not to initiate elation.  She was given a prescription for Cardizem slow release 120 mg for rate control.  I saw her in follow-up of her emergency room evaluation on April 25, 2020.  At that time she denied any awareness of recurrent  irregular heart rhythm with her pulse typically in the 60s to 70s.  She denied shortness of breath.  She denied any chest pressure with activity.  During that evaluation her blood pressure was elevated and I recommended further titration of diltiazem CD from 120 mg up to 180 mg every morning.  I recommended she wear a 14-day Zio patch.  Her 2-week Zio patch showed predominant sinus rhythm with an average heart rate of 77 bpm.  She had 1 run of nonsustained ventricular tachycardia of 4 beats with a maximum rate of 169.  There were 21 episodes of SVT with the fastest interval lasting 13 beats up to a maximum of 176 and the longest interval lasting 28 seconds with an average rate at 110 bpm.  There were rare isolated PACs, atrial couplets and triplets of less than 1%.  There were very rare isolated PVCs.  She did not have any recurrent atrial fibrillation.  She underwent an echo Doppler study on  May 15, 2020 which showed an EF of 60 to 65%, mild LVH and grade 2 diastolic dysfunction.  His mild to moderate increased right ventricular systolic pressure at 46 mm.  There is mild to moderate mitral regurgitation and mild aortic sclerosis.  Atrial sizes were normal.    I last saw her on July 03, 2020 at which time she denied any awareness of recurrent palpitations. She continued to be on diltiazem 180 mg daily and propranolol 40 mg twice a day.  She is on Zetia for hyperlipidemia.  For GI issues she is on Azulfidine in addition to Rowasa.  She continues to experience some intermittent back pain as result of a T7 fracture.    Since I last saw her, she has been followed by Dr. Janese Banks of hematology oncology for her kappa light chain multiple myeloma.  PET scan in August 2022 did show a pathological fracture involving the anterolateral left fourth rib.  There also was a lytic lesion in the medial left acetabulum.  There was evidence for aortic atherosclerosis and coronary artery calcification.  She denies any awareness of palpitations or recurrent SVT.  She continues to be on propranolol 40 mg twice a day in addition to diltiazem 180 mg daily.  She is followed by Dr. Hilarie Fredrickson for GI.  She denies any chest pain PND orthopnea.  She presents for evaluation.  Past Medical History:  Diagnosis Date   Arthritis 2016   R wrist predominantly radius scaphoid and midcarpal [name]   History of migraine remote   History of shingles    HLD (hyperlipidemia)    Hypertension    Macular degeneration    [name]   Multiple myeloma (HCC)    MVP (mitral valve prolapse)    Palpitations    Postherpetic neuralgia 12/27/2012   Ulcerative colitis ([place])    left sided - proctosigmoiditis ([name])    Past Surgical History:  Procedure Laterality Date   BREAST BIOPSY  1975   COLONOSCOPY  2011   [facility]   TRACHEOSTOMY  1942   due to Strep Throat (?epiglottitis)    Current Medications: Outpatient Medications  Prior to Visit  Medication Sig Dispense Refill   aspirin EC 81 MG tablet Take 1 tablet (81 mg total) by mouth daily. Swallow whole. 30 tablet 11   Cholecalciferol (VITAMIN D3) 25 MCG (1000 UT) CAPS Take 1 capsule (1,000 Units total) by mouth daily. 30 capsule    dexamethasone (DECADRON) 4 MG tablet TAKE 5 TABLETS (20 MG TOTAL) BY MOUTH ONCE A WEEK. TAKE WITH BREAKFAST. 20 tablet 2   diltiazem (CARDIZEM CD) 180 MG 24 hr capsule TAKE 1 CAPSULE BY MOUTH EVERY DAY 90 capsule 3   ezetimibe (ZETIA) 10 MG tablet Take 1 tablet (10 mg total) by mouth daily. 90 tablet 3   folic acid (FOLVITE) 1 MG tablet Take 1 tablet (1 mg total) by mouth daily.     lenalidomide (REVLIMID) 5 MG capsule Take 1 capsule (5 mg total) by mouth daily. 5 mg daily for 21 days and 1 week offi.  [employer] Auth # [id number]     Date Obtained 04/06/2021 21 capsule 0   polyethylene glycol (MIRALAX / GLYCOLAX) 17 g packet Take 17 g by mouth daily.     propranolol (INDERAL) 40 MG tablet TAKE 1 TABLET BY MOUTH TWICE A DAY 180 tablet 1   sulfaSALAzine (AZULFIDINE) 500 MG tablet TAKE 2 TABLETS BY MOUTH TWICE A DAY. *PLEASE KEEP SCHEDULED APPOINTMENT FOR FURTHER REFILLS.* (Patient taking differently: TAKE 2 TABLETS BY MOUTH TWICE A DAY. *PLEASE KEEP SCHEDULED APPOINTMENT FOR FURTHER REFILLS.* pt states taking 2 in morning and one in the evening.) 120 tablet 4   Wheat Dextrin (BENEFIBER PO) Take 1 Dose by mouth as needed. 1 tablespoon daily     No facility-administered medications prior to visit.     Allergies:   Tramadol, Codeine, Cortisone, and Guaifenesin er   Social History   Socioeconomic History   Marital status: Married    Spouse name: Not on file   Number of children: Not on file   Years of education: Not on file   Highest education level: Not on file  Occupational History   Not on file  Tobacco Use   Smoking status: Never   Smokeless tobacco: Never  Vaping Use   Vaping Use: Never used  Substance  and Sexual Activity    Alcohol use: No   Drug use: No   Sexual activity: Never  Other Topics Concern   Not on file  Social History Narrative   Lives with husband [name])   [place] child (daughter)   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[name]   Activity: walks and active in yard, grows vegetables   Diet: good water, good fruits/vegetables daily   Social Determinants of Radio broadcast assistant Strain: Low Risk    Difficulty of Paying Living Expenses: Not hard at all  Food Insecurity: No Food Insecurity   Worried About Charity fundraiser in the Last Year: Never true   Arboriculturist in the Last Year: Never true  Transportation Needs: No Transportation Needs   Lack of Transportation (Medical): No   Lack of Transportation (Non-Medical): No  Physical Activity: Inactive   Days of Exercise per Week: 0 days   Minutes of Exercise per Session: 0 min  Stress: No Stress Concern Present   Feeling of Stress : Not at all  Social Connections: Not on file     Family History  family history includes Heart disease in her mother; Hypertension in her mother; Lung cancer in her paternal grandfather; Melanoma in her brother and father; Rheum arthritis in her father; Stroke in her mother.   ROS General: Negative; No fevers, chills, or night sweats;  HEENT: Negative; No changes in vision or hearing, sinus congestion, difficulty swallowing Pulmonary: Negative; No cough, wheezing, shortness of breath, hemoptysis Cardiovascular: Positive for MVP, hypertension, and intermittent palpitations GI: Negative; No nausea, vomiting, diarrhea, or abdominal pain GU: Negative; No dysuria, hematuria, or difficulty voiding Musculoskeletal: T7 back discomfort from prior fracture Hematologic/Oncology: Negative; no easy bruising, bleeding Endocrine: Negative; no heat/cold intolerance; no diabetes Neuro: Negative; no changes in balance, headaches Skin: Negative; No rashes or skin lesions Psychiatric: Negative; No behavioral  problems, depression Sleep: Negative; No snoring, daytime sleepiness, hypersomnolence, bruxism, restless legs, hypnogognic hallucinations, no cataplexy Other comprehensive 14 point system review is negative.   PHYSICAL EXAM:   VS:  BP 126/64   Pulse 64   Wt 136 lb (61.7 kg)   SpO2 96%   BMI 24.87 kg/m     Repeat blood pressure by me 128/66  Wt Readings from Last 3 Encounters:  04/07/21 136 lb (61.7 kg)  02/27/21 134 lb (60.8 kg)  02/25/21 134 lb (60.8 kg)    General: Alert, oriented, no distress.  Skin: normal turgor, no rashes, warm and dry HEENT: Normocephalic, atraumatic. Pupils equal round and reactive to light; sclera anicteric; extraocular muscles intact;  Nose without nasal septal hypertrophy Mouth/Parynx benign; Mallinpatti scale 3 Neck: No JVD, no carotid bruits; normal carotid upstroke Lungs: clear to ausculatation and percussion; no wheezing or rales Chest wall: without tenderness to palpitation Heart: PMI not displaced, RRR, s1 s2 normal, 1/6 systolic murmur, no diastolic murmur, no rubs, gallops, thrills, or heaves Abdomen: soft, nontender; no hepatosplenomehaly, BS+; abdominal aorta nontender and not dilated by palpation. Back: no CVA tenderness Pulses 2+ Musculoskeletal: full range of motion, normal strength, no joint deformities Extremities: no clubbing cyanosis or edema, Homan's sign negative  Neurologic: grossly nonfocal; Cranial nerves grossly wnl Psychologic: Normal mood and affect  Studies/Labs Reviewed:   April 07, 2021 ECG (independently read by me): Normal sinus rhythm at 64 bpm.  Nonspecific T wave abnormality inferolaterally.  No ectopy  July 03, 2020 ECG (independently read by me): NSR at 77; nonspecific T wave  abnormality, no ectopy  December 2021 ECG (independently read by me): Normal sinus rhythm at 68 bpm.  Mild T wave abnormality inferolaterally.  Normal intervals.  No ectopy.  September 2021 ECG (independently read by me): Normal  sinus rhythm at 73 bpm.  No ectopy.  Normal intervals.  September 2020 ECG (independently read by me): Normal sinus rhythm at 72 bpm.  Normal intervals.  No ectopy.  April 2019 ECG (independently read by me): Normal sinus rhythm at 74 bpm.  Normal intervals.  Nonspecific T changes.  October 2018 ECG (independently read by me): Normal sinus rhythm at 66 bpm.  No ectopy.  Normal intervals.  March 2018 ECG (independently read by me): NSR at 80; Normal intervals. QTc 405 msec. No ST changes.  January 2018 ECG (independently read by me): Normal sinus rhythm at 85 bpm.  Nonspecific T changes.  QTc interval 445 ms.  October 2017 ECG (independently read by me): Normal sinus rhythm at 63 bpm.  No ectopy.  Normal intervals.  Recent Labs: BMP Latest Ref Rng & Units 03/27/2021 02/27/2021 02/25/2021  Glucose 70 - 99 mg/dL 128(H) 110(H) 115(H)  BUN 8 - 23 mg/dL 24(H) 25(H) 28(H)  Creatinine 0.44 - 1.00 mg/dL 1.27(H) 1.27(H) 1.54(H)  BUN/Creat Ratio 12 - 28 - - -  Sodium 135 - 145 mmol/L 138 137 136  Potassium 3.5 - 5.1 mmol/L 4.3 4.9 4.2  Chloride 98 - 111 mmol/L 104 103 102  CO2 22 - 32 mmol/L _0 Calcium 8.9 - 10.3 mg/dL 8.3(L) 9.1 8.1(L)     Hepatic Function Latest Ref Rng & Units 03/27/2021 02/27/2021 02/25/2021  Total Protein 6.5 - 8.1 g/dL 4.9(L) 5.6(L) 4.8(L)  Albumin 3.5 - 5.0 g/dL 3.1(L) 3.4(L) 3.1(L)  AST 15 - 41 U/L 17 40 49(H)  ALT 0 - 44 U/L 23 87(H) 59(H)  Alk Phosphatase 38 - 126 U/L 86 120 102  Total Bilirubin 0.3 - 1.2 mg/dL 0.4 0.7 0.9  Bilirubin, Direct <=0.2 mg/dL - - -    CBC Latest Ref Rng & Units 03/27/2021 02/27/2021 02/25/2021  WBC 4.0 - 10.5 K/uL 11.4(H) 12.9(H) 17.4(H)  Hemoglobin 12.0 - 15.0 g/dL 9.4(L) 9.1(L) 9.2(L)  Hematocrit 36.0 - 46.0 % 29.4(L) 28.0(L) 27.6(L)  Platelets 150 - 400 K/uL 260 194 208   Lab Results  Component Value Date   MCV 103.9 (H) 03/27/2021   MCV 99.3 02/27/2021   MCV 100.4 (H) 02/25/2021   Lab Results  Component Value  Date   TSH 4.54 (H) 09/12/2020   No results found for: HGBA1C   BNP No results found for: BNP  ProBNP No results found for: PROBNP   Lipid Panel     Component Value Date/Time   CHOL 183 06/24/2020 0914   TRIG 104 06/24/2020 0914   HDL 60 06/24/2020 0914   CHOLHDL 3.1 06/24/2020 0914   CHOLHDL 3.4 12/09/2015 0758   VLDL 21 12/09/2015 0758   LDLCALC 104 (H) 06/24/2020 0914   LDLDIRECT 132.6 12/27/2012 1146     RADIOLOGY: No results found.   Additional studies/ records that were reviewed today include:  I reviewed previous records by Diana Brewer. Recent laboratory was reviewed ER evaluation was reviewed I reviewed her echo Doppler study and Zio patch monitor Records of Dr. Janese Banks were reviewed  ASSESSMENT:    1. Paroxysmal atrial fibrillation [facility])     PLAN:  Ms Stolarz is an 87 year old female who has a history of mitral valve prolapse, hypertension, and had been treated  with propranolol 20 mg twice a day for many years but had developed recurrent palpitations on this therapy.  She did not tolerate the substitution to Toprol-XL and has been back on propranolol  with controlled palpitations on an increased dose at 40 mg twice a day.  She had been followed by Diana Brewer until his retirement.  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  She has been on Azulfidine and Rowasa and is followed by Dr. Hilarie Fredrickson of GI.  During her December 2021 emergency room evaluation she was found to be in atrial fibrillation with RVR and converted to sinus rhythm with Cardizem bolus and drip.  When I saw her in follow-up, I further titrated Cardizem CD to 180 mg and recommended she undergo a 2D echo Doppler study and wear a 14-day Zio patch.  Her echo Doppler study demonstrated an EF of 60 to 65%, mild LVH and grade 2 diastolic dysfunction.  She had  increased RVSP at 46 mm., mild to moderate mitral regurgitation and mild aortic sclerosis.  A  Zio patch monitor showed predominant sinus rhythm but she had a 4 beat episode of nonsustained VT at a rate of 169 bpm.  There also were 21 brief episodes of SVT with the fastest interval lasting 13 beats at a rate of 176 and the longest interval lasting 28 seconds with an average rate of 110.  She believes she is feeling well on her increased Cardizem dose.  Presently, she denies any recurrent palpitations and continues to be on diltiazem 180 mg daily in addition to propranolol 40 mg twice a day.  She is on Zetia for hyperlipidemia.  Her recent PET scan did suggest aortic atherosclerosis and coronary artery calcification.  She will be undergoing repeat laboratory and continues to be followed by Dr. Danise Brewer.  Renal function has improved with creatinine now 1.27.     Medication Adjustments/Labs and Tests Ordered: Current medicines are reviewed at length with the patient today.  Concerns regarding medicines are outlined above.  Medication changes, Labs and Tests ordered today are listed in the Patient Instructions below. Patient Instructions  Medication Instructions:  Your Physician recommend you continue on your current medication as directed.    *If you need a refill on your cardiac medications before your next appointment, please call your pharmacy*   Lab Work: None ordered today   Testing/Procedures: None ordered today   Follow-Up: At [facility], you and your health needs are our priority.  As part of our continuing mission to provide you with exceptional heart care, we have created designated Provider Care Teams.  These Care Teams include your primary Cardiologist (physician) and Advanced Practice Providers (APPs -  Physician Assistants and Nurse Practitioners) who all work together to provide you with the care you need, when you need it.  We recommend signing up for the patient portal called "MyChart".  Sign up information is provided on this After Visit Summary.  MyChart is used to  connect with patients for Virtual Visits (Telemedicine).  Patients are able to view lab/test results, encounter notes, upcoming appointments, etc.  Non-urgent messages can be sent to your provider as well.   To learn more about what you can do with MyChart, go to [email or web address].    Your next appointment:   1 year(s)  The format for your next appointment:   In Person  Provider:   [name], MD      Signed, Diana Majestic, MD, [facility] 04/14/2021  3:33 PM    [facility] [address], [place], [place], [place]  [address] Phone: [phone]

## 2021-04-22 ENCOUNTER — Ambulatory Visit: Payer: Medicare Other | Admitting: Internal Medicine

## 2021-04-22 ENCOUNTER — Encounter: Payer: Self-pay | Admitting: Internal Medicine

## 2021-04-22 VITALS — BP 128/70 | HR 60 | Ht 60.0 in | Wt 136.0 lb

## 2021-04-22 DIAGNOSIS — R194 Change in bowel habit: Secondary | ICD-10-CM

## 2021-04-22 DIAGNOSIS — K582 Mixed irritable bowel syndrome: Secondary | ICD-10-CM

## 2021-04-22 DIAGNOSIS — C9000 Multiple myeloma not having achieved remission: Secondary | ICD-10-CM | POA: Diagnosis not present

## 2021-04-22 DIAGNOSIS — K5130 Ulcerative (chronic) rectosigmoiditis without complications: Secondary | ICD-10-CM

## 2021-04-22 NOTE — Progress Notes (Signed)
Subjective:    Patient ID: Diana Brewer, female    DOB: 1934/01/30, 87 y.o.   MRN: [id number]  HPI Diana Brewer is an 87 year old female with a history of distal proctosigmoiditis, mild constipation, recent diagnosis and treatment for multiple myeloma, hypertension, hyperlipidemia, prior COVID-19 in November 2020 who is here for follow-up.  She is here today with her husband.  She was last seen on 12/24/2019.  From a colitis perspective she reports she is feeling fine.  Her stools vary from loose to hard which she blames on her Revlimid, multiple myeloma therapy.  At times she will have a cramping lower abdominal pain which often improves with bowel movement.  She does use Pepto-Bismol at times and occasionally Imodium.  Most days she takes a half a dose of MiraLAX.  She is no longer using mesalamine enemas but is taking sulfasalazine 1 g in the morning and 500 mg in the evening.  She cut down from 1 g twice daily because the evening dose is difficult for her to take and at times the pill is hard for her to swallow.  She denies blood in stool.  Appetite fluctuates.  No nausea or vomiting.  She sees Dr. Janese Banks for her multiple myeloma therapy.  To this point she has not been willing to have a bone marrow biopsy.   Review of Systems As per HPI, otherwise negative  Current Medications, Allergies, Past Medical History, Past Surgical History, Family History and Social History were reviewed in [employer] record.    Objective:   Physical Exam BP 128/70    Pulse 60    Ht 5' (1.524 m)    Wt 136 lb (61.7 kg)    SpO2 96%    BMI 26.56 kg/m  Gen: awake, alert, NAD HEENT: anicteric, op clear CV: RRR, no mrg Pulm: CTA b/l Abd: soft, NT/ND, +BS throughout Ext: no c/c/e Neuro: nonfocal NUCLEAR MEDICINE PET WHOLE BODY   TECHNIQUE: 7.8 mCi F-18 FDG was injected intravenously. Full-ring PET imaging was performed from the head to foot after the radiotracer. CT data was  obtained and used for attenuation correction and anatomic localization.   Fasting blood glucose: 103 mg/dl   COMPARISON:  None.   FINDINGS: Mediastinal blood pool activity: SUV max 2.6   HEAD/NECK:   No abnormal hypermetabolism.   Incidental CT findings:   None.   CHEST:   No hypermetabolic mediastinal, hilar or axillary lymph nodes. No hypermetabolic pulmonary nodules.   Incidental CT findings:   Aberrant right subclavian artery. Atherosclerotic calcification of the aorta and coronary arteries. Heart is enlarged. No pericardial or pleural effusion.   ABDOMEN/PELVIS:   Mild bilateral adrenal hypermetabolism without CT correlates. No abnormal hypermetabolism in liver, spleen or pancreas. Focal hypermetabolism in the rectosigmoid colon without a CT correlate. No hypermetabolic lymph nodes.   Incidental CT findings:   Liver, gallbladder, adrenal glands, kidneys, spleen, pancreas, stomach and bowel are grossly unremarkable.   SKELETON:   Focal hypermetabolism in medial left acetabulum is seen in association with a 1.4 cm lytic lesion (3/231), SUV max 5.6. There is rounded protruding soft tissue along the ventral L5-S1 interspace, measuring 1.6 cm (3/207) with an SUV max 6.2. No additional abnormal hypermetabolism.   Incidental CT findings:   Lytic mottling throughout the visualized osseous structures. Scattered thoracolumbar compression deformities.   EXTREMITIES:   No abnormal hypermetabolism.   Incidental CT findings:   None.   IMPRESSION: 1. Lytic mottling throughout the visualized osseous  structures with a single hypermetabolic lytic lesion in the medial left acetabulum findings consistent with the provided history of multiple myeloma. 2. Hypermetabolic rounded soft tissue extending from the ventral L5-S1 disc space, possibly representing extruded disc material. Multiple myeloma is not excluded. 3. Focal hypermetabolism in the rectosigmoid colon  without a CT correlate. 4. Mild bilateral adrenal hypermetabolism, also without CT correlates. 5. Aortic atherosclerosis (ICD10-I70.0). Coronary artery calcification.   Electronically Signed: By: Lorin Picket M.D. On: 12/17/2020 10:36ADDENDUM: Upon further review, there is mild uptake associated with the anterolateral left fourth rib, where there is a pathologic fracture (3/126).     Electronically Signed   By: Lorin Picket M.D.   On: 12/17/2020 11:34      Assessment & Plan:  87 year old female with a history of distal proctosigmoiditis, mild constipation, recent diagnosis and treatment for multiple myeloma, hypertension, hyperlipidemia, prior COVID-19 in November 2020 who is here for follow-up.    Ulcerative proctosigmoiditis (long-standing) --she was previously on mesalamine enema and sulfasalazine 1 g twice daily.  She can no longer for physical constraints use the mesalamine enemas but fortunately her colitis has been under good control.  She is not having frequent diarrhea or rectal bleeding.  In the past blood per rectum was her most predominant symptom.  She had recent cross-sectional imaging with CT PET scan which did not show any colonic pathology or colitis changes.  We reviewed this together in detail.  It is also very likely that the dexamethasone which she is receiving weekly for her multiple myeloma also helps her colitis.  No plans for surveillance endoscopic evaluation based on age and she is in agreement --Continue sulfasalazine 1 g twice daily --Continue folic acid supplementation  2.  Alternating bowel habits --likely medication induced as she does have a history of chronic constipation.  She will continue MiraLAX 8 to 17 g daily.  She can use Pepto-Bismol over-the-counter if needed for loose stools or even Imodium.  3.  Multiple myeloma --therapy under the direction of Dr. Janese Banks with medical oncology.  She can return in about 2 years, sooner if needed  30  minutes total spent today including patient facing time, coordination of care, reviewing medical history/procedures/pertinent radiology studies, and documentation of the encounter.

## 2021-04-22 NOTE — Patient Instructions (Signed)
Continue Sulfasalazine and folic acid at prescribed dosages.  Continue Miralax and Pepto Bismol as needed.  Follow up with Dr Hilarie Fredrickson in 2 years.  If you are age 85 or older, your body mass index should be between 23-30. Your Body mass index is 26.56 kg/m. If this is out of the aforementioned range listed, please consider follow up with your Primary Care Provider.  If you are age 27 or younger, your body mass index should be between 19-25. Your Body mass index is 26.56 kg/m. If this is out of the aformentioned range listed, please consider follow up with your Primary Care Provider.   ________________________________________________________  The [facility] providers would like to encourage you to use [facility] to communicate with providers for non-urgent requests or questions.  Due to long hold times on the telephone, sending your provider a message by [facility] may be a faster and more efficient way to get a response.  Please allow 48 business hours for a response.  Please remember that this is for non-urgent requests.  _______________________________________________________ Due to recent changes in healthcare laws, you may see the results of your imaging and laboratory studies on MyChart before your provider has had a chance to review them.  We understand that in some cases there may be results that are confusing or concerning to you. Not all laboratory results come back in the same time frame and the provider may be waiting for multiple results in order to interpret others.  Please give [place] 48 hours in order for your provider to thoroughly review all the results before contacting the office for clarification of your results.

## 2021-04-23 ENCOUNTER — Other Ambulatory Visit: Payer: Self-pay | Admitting: *Deleted

## 2021-04-23 DIAGNOSIS — C9000 Multiple myeloma not having achieved remission: Secondary | ICD-10-CM

## 2021-04-29 ENCOUNTER — Ambulatory Visit: Payer: Medicare Other | Admitting: Oncology

## 2021-04-29 ENCOUNTER — Other Ambulatory Visit: Payer: Medicare Other

## 2021-04-30 ENCOUNTER — Inpatient Hospital Stay: Payer: Medicare Other | Attending: Oncology

## 2021-04-30 ENCOUNTER — Inpatient Hospital Stay (HOSPITAL_BASED_OUTPATIENT_CLINIC_OR_DEPARTMENT_OTHER): Payer: Medicare Other | Admitting: Oncology

## 2021-04-30 ENCOUNTER — Other Ambulatory Visit: Payer: Self-pay

## 2021-04-30 ENCOUNTER — Encounter: Payer: Self-pay | Admitting: Oncology

## 2021-04-30 VITALS — BP 122/71 | HR 75 | Temp 97.1°F | Ht 60.0 in | Wt 136.0 lb

## 2021-04-30 DIAGNOSIS — C9000 Multiple myeloma not having achieved remission: Secondary | ICD-10-CM | POA: Diagnosis not present

## 2021-04-30 DIAGNOSIS — M810 Age-related osteoporosis without current pathological fracture: Secondary | ICD-10-CM | POA: Insufficient documentation

## 2021-04-30 DIAGNOSIS — H538 Other visual disturbances: Secondary | ICD-10-CM | POA: Insufficient documentation

## 2021-04-30 DIAGNOSIS — R1310 Dysphagia, unspecified: Secondary | ICD-10-CM

## 2021-04-30 LAB — CBC WITH DIFFERENTIAL/PLATELET
Abs Immature Granulocytes: 0.1 10*3/uL — ABNORMAL HIGH (ref 0.00–0.07)
Basophils Absolute: 0.1 10*3/uL (ref 0.0–0.1)
Basophils Relative: 1 %
Eosinophils Absolute: 0.5 10*3/uL (ref 0.0–0.5)
Eosinophils Relative: 3 %
HCT: 28.1 % — ABNORMAL LOW (ref 36.0–46.0)
Hemoglobin: 9 g/dL — ABNORMAL LOW (ref 12.0–15.0)
Immature Granulocytes: 1 %
Lymphocytes Relative: 14 %
Lymphs Abs: 1.9 10*3/uL (ref 0.7–4.0)
MCH: 31.9 pg (ref 26.0–34.0)
MCHC: 32 g/dL (ref 30.0–36.0)
MCV: 99.6 fL (ref 80.0–100.0)
Monocytes Absolute: 1.5 10*3/uL — ABNORMAL HIGH (ref 0.1–1.0)
Monocytes Relative: 11 %
Neutro Abs: 9.6 10*3/uL — ABNORMAL HIGH (ref 1.7–7.7)
Neutrophils Relative %: 70 %
Platelets: 312 10*3/uL (ref 150–400)
RBC: 2.82 MIL/uL — ABNORMAL LOW (ref 3.87–5.11)
RDW: 17.2 % — ABNORMAL HIGH (ref 11.5–15.5)
WBC: 13.6 10*3/uL — ABNORMAL HIGH (ref 4.0–10.5)
nRBC: 0 % (ref 0.0–0.2)

## 2021-04-30 LAB — COMPREHENSIVE METABOLIC PANEL
ALT: 22 U/L (ref 0–44)
AST: 14 U/L — ABNORMAL LOW (ref 15–41)
Albumin: 3.1 g/dL — ABNORMAL LOW (ref 3.5–5.0)
Alkaline Phosphatase: 74 U/L (ref 38–126)
Anion gap: 10 (ref 5–15)
BUN: 26 mg/dL — ABNORMAL HIGH (ref 8–23)
CO2: 25 mmol/L (ref 22–32)
Calcium: 8.6 mg/dL — ABNORMAL LOW (ref 8.9–10.3)
Chloride: 103 mmol/L (ref 98–111)
Creatinine, Ser: 1.24 mg/dL — ABNORMAL HIGH (ref 0.44–1.00)
GFR, Estimated: 43 mL/min — ABNORMAL LOW (ref >60)
Glucose, Bld: 96 mg/dL (ref 70–99)
Potassium: 4.1 mmol/L (ref 3.5–5.1)
Sodium: 138 mmol/L (ref 135–145)
Total Bilirubin: 0.5 mg/dL (ref 0.3–1.2)
Total Protein: 5.2 g/dL — ABNORMAL LOW (ref 6.5–8.1)

## 2021-04-30 NOTE — Progress Notes (Signed)
Hematology/Oncology Consult note [facility]  [phone] [phone]  Patient Care Team: [name], MD as PCP - General (Family Medicine) Troy Sine, MD as PCP - Cardiology (Cardiology) [name], MD as Consulting Physician (Hematology and Oncology)   Name of the patient: Diana Brewer  [id number]  1933/08/12   Date of visit: 04/30/21  Diagnosis-Kappa light chain multiple myeloma  Chief complaint/ Reason for visit-routine follow-up of multiple myeloma  Heme/Onc history:  Patient is a 87 year old female who has been having ongoing back pain for the last 1 year.  She underwent x-ray of her thoracic and lumbar spine which showed T9 inferior endplate fracture as well as remote T5 T7 and T11 fractures.  She was seen by Dr. Lacinda Axon from neurosurgery and was recommended medical management for her osteoporosis.  Her mobility has been affected because of her back pain.  She is mostly sedentary and will walk for short distances.  She had some work-up done by Dr. Danise Mina in June 2022 which showed elevated kappa free light chain of 3813 and ratio of 302.  Myeloma panel showed no M protein.   Patient refused to undergo bone marrow biopsy.PET CT scan showed lytic mottling lesions throughout the visualized osseous structures with single hypermetabolic lytic lesion in the medial left acetabulum concerning for multiple myeloma.  Hypermetabolic rounded soft tissue extending from L5-S1 disc space.  Mild bilateral adrenal hypermetabolism without CT correlate.  Focal hypermetabolism in the rectosigmoid colon without CT correlate.   Patient started on Revlimid in August 2022.  Diagnosis of multiple myeloma was made purely on the basis of free light chain ratio that was more than 100.  She continues to refuse additional treatments, bone marrow biopsy and bisphosphonates  Interval history-patient is currently on Revlimid 5 mg daily 3 weeks on 1 week  off.  She has 3 more pills left before completing her 3 weeks on.  States she is having trouble swallowing pills and feels they get "hung".  This seems to be worsening.  She also complains of fuzzy or blurry vision since restarting the Revlimid.  ECOG PS- 3 Pain scale- 2   Review of systems- Review of Systems  Constitutional:  Positive for malaise/fatigue. Negative for chills, fever and weight loss.  HENT:  Negative for congestion, ear pain and tinnitus.        Swallowing problem  Eyes:  Positive for blurred vision. Negative for double vision.  Respiratory: Negative.  Negative for cough, sputum production and shortness of breath.   Cardiovascular: Negative.  Negative for chest pain, palpitations and leg swelling.  Gastrointestinal: Negative.  Negative for abdominal pain, constipation, diarrhea, nausea and vomiting.  Genitourinary:  Negative for dysuria, frequency and urgency.  Musculoskeletal:  Positive for back pain. Negative for falls.  Skin: Negative.  Negative for rash.  Neurological:  Positive for headaches. Negative for weakness.  Endo/Heme/Allergies: Negative.  Does not bruise/bleed easily.  Psychiatric/Behavioral:  Negative for depression. The patient is nervous/anxious. The patient does not have insomnia.       Allergies  Allergen Reactions   Tramadol Nausea Only   Codeine Other (See Comments)    nausea     Cortisone Swelling    Swelling    Guaifenesin Er Other (See Comments)    Doesn't work, made her sick     Past Medical History:  Diagnosis Date   Arthritis 2016   R wrist predominantly radius scaphoid and midcarpal [name]   History of migraine  remote   History of shingles    HLD (hyperlipidemia)    Hypertension    Macular degeneration    [name]   Multiple myeloma (HCC)    MVP (mitral valve prolapse)    Palpitations    Postherpetic neuralgia 12/27/2012   Ulcerative colitis ([place])    left sided - proctosigmoiditis ([name])     Past  Surgical History:  Procedure Laterality Date   BREAST BIOPSY  1975   COLONOSCOPY  2011   [facility]   TRACHEOSTOMY  1942   due to Strep Throat (?epiglottitis)    Social History   Socioeconomic History   Marital status: Married    Spouse name: Not on file   Number of children: Not on file   Years of education: Not on file   Highest education level: Not on file  Occupational History   Not on file  Tobacco Use   Smoking status: Never   Smokeless tobacco: Never  Vaping Use   Vaping Use: Never used  Substance and Sexual Activity   Alcohol use: No   Drug use: No   Sexual activity: Never  Other Topics Concern   Not on file  Social History Narrative   Lives with husband [name])   Kramer child (daughter)   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[name]   Activity: walks and active in yard, grows vegetables   Diet: good water, good fruits/vegetables daily   Social Determinants of Radio broadcast assistant Strain: Low Risk    Difficulty of Paying Living Expenses: Not hard at all  Food Insecurity: No Food Insecurity   Worried About Charity fundraiser in the Last Year: Never true   Arboriculturist in the Last Year: Never true  Transportation Needs: No Transportation Needs   Lack of Transportation (Medical): No   Lack of Transportation (Non-Medical): No  Physical Activity: Inactive   Days of Exercise per Week: 0 days   Minutes of Exercise per Session: 0 min  Stress: No Stress Concern Present   Feeling of Stress : Not at all  Social Connections: Not on file  Intimate Partner Violence: Not At Risk   Fear of Current or Ex-Partner: No   Emotionally Abused: No   Physically Abused: No   Sexually Abused: No    Family History  Problem Relation Age of Onset   Hypertension Mother    Stroke Mother    Heart disease Mother    Rheum arthritis Father    Melanoma Father    Melanoma Brother    Lung cancer Paternal Grandfather    Colon cancer Neg Hx    Esophageal cancer Neg Hx     Pancreatic cancer Neg Hx    Stomach cancer Neg Hx      Current Outpatient Medications:    aspirin EC 81 MG tablet, Take 1 tablet (81 mg total) by mouth daily. Swallow whole., Disp: 30 tablet, Rfl: 11   Cholecalciferol (VITAMIN D3) 25 MCG (1000 UT) CAPS, Take 1 capsule (1,000 Units total) by mouth daily., Disp: 30 capsule, Rfl:    dexamethasone (DECADRON) 4 MG tablet, TAKE 5 TABLETS (20 MG TOTAL) BY MOUTH ONCE A WEEK. TAKE WITH BREAKFAST. (Patient taking differently: Take 20 mg by mouth once a week. Take with breakfast. Takes 1/2 tab qd.), Disp: 20 tablet, Rfl: 2   diltiazem (CARDIZEM CD) 180 MG 24 hr capsule, TAKE 1 CAPSULE BY MOUTH EVERY DAY, Disp: 90 capsule, Rfl: 3   ezetimibe (ZETIA)  10 MG tablet, Take 1 tablet (10 mg total) by mouth daily., Disp: 90 tablet, Rfl: 3   folic acid (FOLVITE) 1 MG tablet, Take 1 tablet (1 mg total) by mouth daily., Disp: , Rfl:    lenalidomide (REVLIMID) 5 MG capsule, Take 1 capsule (5 mg total) by mouth daily. 5 mg daily for 21 days and 1 week offi.  [employer] Auth # [id number]     Date Obtained 04/06/2021, Disp: 21 capsule, Rfl: 0   polyethylene glycol (MIRALAX / GLYCOLAX) 17 g packet, Take 17 g by mouth daily., Disp: , Rfl:    propranolol (INDERAL) 40 MG tablet, TAKE 1 TABLET BY MOUTH TWICE A DAY, Disp: 180 tablet, Rfl: 1   sulfaSALAzine (AZULFIDINE) 500 MG tablet, TAKE 2 TABLETS BY MOUTH TWICE A DAY. *PLEASE KEEP SCHEDULED APPOINTMENT FOR FURTHER REFILLS.* (Patient taking differently: TAKE 2 TABLETS BY MOUTH TWICE A DAY. *PLEASE KEEP SCHEDULED APPOINTMENT FOR FURTHER REFILLS.* pt states taking 2 in morning and one in the evening.), Disp: 120 tablet, Rfl: 4   Wheat Dextrin (BENEFIBER PO), Take 1 Dose by mouth as needed. 1 tablespoon daily, Disp: , Rfl:   Physical exam:  Vitals:   04/30/21 1429  BP: 122/71  Pulse: 75  Temp: (!) 97.1 F (36.2 C)  TempSrc: Tympanic  SpO2: 98%  Weight: 136 lb (61.7 kg)  Height: 5' (1.524 m)   Physical Exam Constitutional:       Appearance: Normal appearance.  HENT:     Head: Normocephalic and atraumatic.  Eyes:     Pupils: Pupils are equal, round, and reactive to light.  Cardiovascular:     Rate and Rhythm: Normal rate and regular rhythm.     Heart sounds: Normal heart sounds. No murmur heard. Pulmonary:     Effort: Pulmonary effort is normal.     Breath sounds: Normal breath sounds. No wheezing.  Abdominal:     General: Bowel sounds are normal. There is no distension.     Palpations: Abdomen is soft.     Tenderness: There is no abdominal tenderness.  Musculoskeletal:        General: Normal range of motion.     Cervical back: Normal range of motion.  Skin:    General: Skin is warm and dry.     Findings: No rash.  Neurological:     Mental Status: She is alert and oriented to person, place, and time.  Psychiatric:        Judgment: Judgment normal.     CMP Latest Ref Rng & Units 04/30/2021  Glucose 70 - 99 mg/dL 96  BUN 8 - 23 mg/dL 26(H)  Creatinine 0.44 - 1.00 mg/dL 1.24(H)  Sodium 135 - 145 mmol/L 138  Potassium 3.5 - 5.1 mmol/L 4.1  Chloride 98 - 111 mmol/L 103  CO2 22 - 32 mmol/L 25  Calcium 8.9 - 10.3 mg/dL 8.6(L)  Total Protein 6.5 - 8.1 g/dL 5.2(L)  Total Bilirubin 0.3 - 1.2 mg/dL 0.5  Alkaline Phos 38 - 126 U/L 74  AST 15 - 41 U/L 14(L)  ALT 0 - 44 U/L 22   CBC Latest Ref Rng & Units 04/30/2021  WBC 4.0 - 10.5 K/uL 13.6(H)  Hemoglobin 12.0 - 15.0 g/dL 9.0(L)  Hematocrit 36.0 - 46.0 % 28.1(L)  Platelets 150 - 400 K/uL 312     Assessment and plan- Patient is a 87 y.o. female with history of kappa light chain multiple myeloma here for routine follow-up  Recently started back on Revlimid  5 mg tablets daily every 3 weeks on and 1 week off.  She also continues her dexamethasone 55m/week.  She has 3 tablets left before completing her 3 weeks on.  Lab work from today is very stable.  Patient has multiple complaints today including blurred vision and swallowing concerns that have  worsened since initiating Revlimid.  Unclear if these are related but patient is interested in decreasing her treatment to Revlimid 5 mg 2 weeks on with 2 weeks off.  States she spoke to another clinician who stated this was a potential option.  Will discuss with Dr. RJanese Banks  I have asked that she continue her Revlimid for the next 3 days to finish off her 3 weeks at that point we will start her 1 week off.    Dysphagia-discussed several options to have this evaluated if interested in the future.  She would like to see if her symptoms improve with her week off of Revlimid.  Blurry vision-we will see if symptoms improve while off Revlimid.  Can try over-the-counter eyedrops.  Disposition- Will discuss with Dr. RJanese Banksplan moving forward. Keep appointments for 05/27/2020.   I spent 25 minutes dedicated to the care of this patient (face-to-face and non-face-to-face) on the date of the encounter to include what is described in the assessment and plan.  Visit Diagnosis 1. Multiple myeloma not having achieved remission ([place]   2. Dysphagia, unspecified type   3. Blurred vision    JFaythe Casa NP 04/30/2021 4:03 PM

## 2021-04-30 NOTE — Progress Notes (Signed)
Pt would like to know why her dexamethasone was decreased. And could this rx make her hair fall out? Pt states food doesn't taste good. Is there supplement options other than boost/ensure? Alternating b/w constipation and diarrhea.

## 2021-05-01 LAB — KAPPA/LAMBDA LIGHT CHAINS
Kappa free light chain: 24.9 mg/L — ABNORMAL HIGH (ref 3.3–19.4)
Kappa, lambda light chain ratio: 1.47 (ref 0.26–1.65)
Lambda free light chains: 16.9 mg/L (ref 5.7–26.3)

## 2021-05-05 ENCOUNTER — Other Ambulatory Visit: Payer: Self-pay | Admitting: *Deleted

## 2021-05-05 ENCOUNTER — Telehealth: Payer: Self-pay | Admitting: Oncology

## 2021-05-05 NOTE — Telephone Encounter (Signed)
Re-Treatment for Multiple Myeloma  Currently on 5 mg Revlimid 3 weeks on and 1 week off.   Has asked to switch to 5 mg Revlimid 2 weeks on and 2 weeks off.  Discussed with Dr. Janese Banks who is okay with change.  Attempted to contact patient to verify but unable to reach and/or leave message. Per our discussion last week she will complete her 2 weeks off of Revlimid on 05/15/2021 and restart her Revlimid on 05/16/2021.  She has been scheduled to see Dr. Janese Banks back on 05/27/2021.   [name], NP 05/05/2021 10:53 AM

## 2021-05-07 ENCOUNTER — Other Ambulatory Visit: Payer: Self-pay | Admitting: Cardiovascular Disease

## 2021-05-07 ENCOUNTER — Encounter: Payer: Self-pay | Admitting: Oncology

## 2021-05-07 LAB — MULTIPLE MYELOMA PANEL, SERUM
Albumin SerPl Elph-Mcnc: 3 g/dL (ref 2.9–4.4)
Albumin/Glob SerPl: 1.5 (ref 0.7–1.7)
Alpha 1: 0.3 g/dL (ref 0.0–0.4)
Alpha2 Glob SerPl Elph-Mcnc: 0.7 g/dL (ref 0.4–1.0)
B-Globulin SerPl Elph-Mcnc: 0.8 g/dL (ref 0.7–1.3)
Gamma Glob SerPl Elph-Mcnc: 0.4 g/dL (ref 0.4–1.8)
Globulin, Total: 2.1 g/dL — ABNORMAL LOW (ref 2.2–3.9)
IgA: 67 mg/dL (ref 64–422)
IgG (Immunoglobin G), Serum: 354 mg/dL — ABNORMAL LOW (ref 586–1602)
IgM (Immunoglobulin M), Srm: 17 mg/dL — ABNORMAL LOW (ref 26–217)
Total Protein ELP: 5.1 g/dL — ABNORMAL LOW (ref 6.0–8.5)

## 2021-05-07 MED ORDER — LENALIDOMIDE 5 MG PO CAPS: 5.0000 mg | ORAL_CAPSULE | Freq: Every day | ORAL | 0 refills | Status: DC

## 2021-05-12 ENCOUNTER — Other Ambulatory Visit: Payer: Self-pay | Admitting: *Deleted

## 2021-05-12 MED ORDER — LENALIDOMIDE 5 MG PO CAPS: 5.0000 mg | ORAL_CAPSULE | Freq: Every day | ORAL | 0 refills | Status: DC

## 2021-05-27 ENCOUNTER — Inpatient Hospital Stay: Payer: Medicare Other | Admitting: Oncology

## 2021-05-27 ENCOUNTER — Encounter: Payer: Self-pay | Admitting: Oncology

## 2021-05-27 ENCOUNTER — Inpatient Hospital Stay: Payer: Medicare Other | Attending: Oncology

## 2021-05-27 ENCOUNTER — Other Ambulatory Visit: Payer: Self-pay

## 2021-05-27 VITALS — BP 118/48 | HR 73 | Temp 98.2°F | Resp 18 | Ht 60.0 in | Wt 126.0 lb

## 2021-05-27 DIAGNOSIS — Z79899 Other long term (current) drug therapy: Secondary | ICD-10-CM

## 2021-05-27 DIAGNOSIS — D729 Disorder of white blood cells, unspecified: Secondary | ICD-10-CM

## 2021-05-27 DIAGNOSIS — C9000 Multiple myeloma not having achieved remission: Secondary | ICD-10-CM | POA: Diagnosis not present

## 2021-05-27 LAB — CBC WITH DIFFERENTIAL/PLATELET
Abs Immature Granulocytes: 0.31 10*3/uL — ABNORMAL HIGH (ref 0.00–0.07)
Basophils Absolute: 0.1 10*3/uL (ref 0.0–0.1)
Basophils Relative: 0 %
Eosinophils Absolute: 0.7 10*3/uL — ABNORMAL HIGH (ref 0.0–0.5)
Eosinophils Relative: 3 %
HCT: 28.1 % — ABNORMAL LOW (ref 36.0–46.0)
Hemoglobin: 9 g/dL — ABNORMAL LOW (ref 12.0–15.0)
Immature Granulocytes: 1 %
Lymphocytes Relative: 6 %
Lymphs Abs: 1.6 10*3/uL (ref 0.7–4.0)
MCH: 32.5 pg (ref 26.0–34.0)
MCHC: 32 g/dL (ref 30.0–36.0)
MCV: 101.4 fL — ABNORMAL HIGH (ref 80.0–100.0)
Monocytes Absolute: 3.4 10*3/uL — ABNORMAL HIGH (ref 0.1–1.0)
Monocytes Relative: 14 %
Neutro Abs: 18.2 10*3/uL — ABNORMAL HIGH (ref 1.7–7.7)
Neutrophils Relative %: 76 %
Platelets: 303 10*3/uL (ref 150–400)
RBC: 2.77 MIL/uL — ABNORMAL LOW (ref 3.87–5.11)
RDW: 18 % — ABNORMAL HIGH (ref 11.5–15.5)
Smear Review: NORMAL
WBC: 24.1 10*3/uL — ABNORMAL HIGH (ref 4.0–10.5)
nRBC: 0 % (ref 0.0–0.2)

## 2021-05-27 LAB — COMPREHENSIVE METABOLIC PANEL
ALT: 21 U/L (ref 0–44)
AST: 18 U/L (ref 15–41)
Albumin: 3.3 g/dL — ABNORMAL LOW (ref 3.5–5.0)
Alkaline Phosphatase: 75 U/L (ref 38–126)
Anion gap: 9 (ref 5–15)
BUN: 33 mg/dL — ABNORMAL HIGH (ref 8–23)
CO2: 24 mmol/L (ref 22–32)
Calcium: 8.4 mg/dL — ABNORMAL LOW (ref 8.9–10.3)
Chloride: 105 mmol/L (ref 98–111)
Creatinine, Ser: 1.59 mg/dL — ABNORMAL HIGH (ref 0.44–1.00)
GFR, Estimated: 32 mL/min — ABNORMAL LOW (ref >60)
Glucose, Bld: 141 mg/dL — ABNORMAL HIGH (ref 70–99)
Potassium: 4.1 mmol/L (ref 3.5–5.1)
Sodium: 138 mmol/L (ref 135–145)
Total Bilirubin: 0.5 mg/dL (ref 0.3–1.2)
Total Protein: 5.3 g/dL — ABNORMAL LOW (ref 6.5–8.1)

## 2021-05-27 MED ORDER — DEXAMETHASONE 4 MG PO TABS: 8.0000 mg | ORAL_TABLET | ORAL | 2 refills | Status: DC

## 2021-05-28 ENCOUNTER — Other Ambulatory Visit: Payer: Self-pay | Admitting: Pharmacist

## 2021-05-28 ENCOUNTER — Encounter: Payer: Self-pay | Admitting: Oncology

## 2021-05-28 MED ORDER — LENALIDOMIDE 2.5 MG PO CAPS: 2.5000 mg | ORAL_CAPSULE | Freq: Every day | ORAL | 0 refills | Status: DC

## 2021-05-28 NOTE — Progress Notes (Signed)
Hematology/Oncology Consult note [facility]  [phone] [phone]  Patient Care Team: [name], MD as PCP - General (Family Medicine) Troy Sine, MD as PCP - Cardiology (Cardiology) [name], MD as Consulting Physician (Hematology and Oncology)   Name of the patient: Diana Brewer  [id number]  1934-05-09   Date of visit: 05/28/21  Diagnosis- Kappa light chain multiple myeloma  Chief complaint/ Reason for visit-routine follow-up of multiple myeloma  Heme/Onc history:  Patient is a 88 year old female who has been having ongoing back pain for the last 1 year.  She underwent x-ray of her thoracic and lumbar spine which showed T9 inferior endplate fracture as well as remote T5 T7 and T11 fractures.  She was seen by Dr. Lacinda Axon from neurosurgery and was recommended medical management for her osteoporosis.  Her mobility has been affected because of her back pain.  She is mostly sedentary and will walk for short distances.  She had some work-up done by Dr. Danise Mina in June 2022 which showed elevated kappa free light chain of 3813 and ratio of 302.  Myeloma panel showed no M protein.   Patient refused to undergo bone marrow biopsy.PET CT scan showed lytic mottling lesions throughout the visualized osseous structures with single hypermetabolic lytic lesion in the medial left acetabulum concerning for multiple myeloma.  Hypermetabolic rounded soft tissue extending from L5-S1 disc space.  Mild bilateral adrenal hypermetabolism without CT correlate.  Focal hypermetabolism in the rectosigmoid colon without CT correlate.   Patient started on Revlimid in August 2022.  Diagnosis of multiple myeloma was made purely on the basis of free light chain ratio that was more than 100.  She continues to refuse additional treatments, bone marrow biopsy and bisphosphonates    Interval history-patient reports that her back pain is significantly  improved after starting treatment for her myeloma.  However she does report more fatigue as compared to before.  Bowel movements have been regular.  Memory is at times foggy per patient  ECOG PS- 3 Pain scale- 2   Review of systems- Review of Systems  Constitutional:  Positive for malaise/fatigue. Negative for chills, fever and weight loss.  HENT:  Negative for congestion, ear discharge and nosebleeds.   Eyes:  Negative for blurred vision.  Respiratory:  Negative for cough, hemoptysis, sputum production, shortness of breath and wheezing.   Cardiovascular:  Negative for chest pain, palpitations, orthopnea and claudication.  Gastrointestinal:  Negative for abdominal pain, blood in stool, constipation, diarrhea, heartburn, melena, nausea and vomiting.  Genitourinary:  Negative for dysuria, flank pain, frequency, hematuria and urgency.  Musculoskeletal:  Negative for back pain, joint pain and myalgias.  Skin:  Negative for rash.  Neurological:  Negative for dizziness, tingling, focal weakness, seizures, weakness and headaches.  Endo/Heme/Allergies:  Does not bruise/bleed easily.  Psychiatric/Behavioral:  Negative for depression and suicidal ideas. The patient does not have insomnia.      Allergies  Allergen Reactions   Tramadol Nausea Only   Codeine Other (See Comments)    nausea     Cortisone Swelling    Swelling    Guaifenesin Er Other (See Comments)    Doesn't work, made her sick     Past Medical History:  Diagnosis Date   Arthritis 2016   R wrist predominantly radius scaphoid and midcarpal [name]   History of migraine remote   History of shingles    HLD (hyperlipidemia)    Hypertension    Macular degeneration  [place]   Multiple myeloma ([place])    MVP (mitral valve prolapse)    Palpitations    Postherpetic neuralgia 12/27/2012   Ulcerative colitis ([place])    left sided - proctosigmoiditis ([name])     Past Surgical History:  Procedure Laterality Date    BREAST BIOPSY  1975   COLONOSCOPY  2011   [facility]   TRACHEOSTOMY  1942   due to Strep Throat (?epiglottitis)    Social History   Socioeconomic History   Marital status: Married    Spouse name: Not on file   Number of children: Not on file   Years of education: Not on file   Highest education level: Not on file  Occupational History   Not on file  Tobacco Use   Smoking status: Never   Smokeless tobacco: Never  Vaping Use   Vaping Use: Never used  Substance and Sexual Activity   Alcohol use: No   Drug use: No   Sexual activity: Never  Other Topics Concern   Not on file  Social History Narrative   Lives with husband [name])   [place] child (daughter)   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[name]   Activity: walks and active in yard, grows vegetables   Diet: good water, good fruits/vegetables daily   Social Determinants of Radio broadcast assistant Strain: Low Risk    Difficulty of Paying Living Expenses: Not hard at all  Food Insecurity: No Food Insecurity   Worried About Charity fundraiser in the Last Year: Never true   Arboriculturist in the Last Year: Never true  Transportation Needs: No Transportation Needs   Lack of Transportation (Medical): No   Lack of Transportation (Non-Medical): No  Physical Activity: Inactive   Days of Exercise per Week: 0 days   Minutes of Exercise per Session: 0 min  Stress: No Stress Concern Present   Feeling of Stress : Not at all  Social Connections: Not on file  Intimate Partner Violence: Not At Risk   Fear of Current or Ex-Partner: No   Emotionally Abused: No   Physically Abused: No   Sexually Abused: No    Family History  Problem Relation Age of Onset   Hypertension Mother    Stroke Mother    Heart disease Mother    Rheum arthritis Father    Melanoma Father    Melanoma Brother    Lung cancer Paternal Grandfather    Colon cancer Neg Hx    Esophageal cancer Neg Hx    Pancreatic cancer Neg Hx    Stomach cancer  Neg Hx      Current Outpatient Medications:    aspirin EC 81 MG tablet, Take 1 tablet (81 mg total) by mouth daily. Swallow whole., Disp: 30 tablet, Rfl: 11   Cholecalciferol (VITAMIN D3) 25 MCG (1000 UT) CAPS, Take 1 capsule (1,000 Units total) by mouth daily., Disp: 30 capsule, Rfl:    diltiazem (CARDIZEM CD) 180 MG 24 hr capsule, TAKE 1 CAPSULE BY MOUTH EVERY DAY, Disp: 90 capsule, Rfl: 3   ezetimibe (ZETIA) 10 MG tablet, TAKE ONE TABLET BY MOUTH DAILY, Disp: 90 tablet, Rfl: 3   folic acid (FOLVITE) 1 MG tablet, Take 1 tablet (1 mg total) by mouth daily., Disp: , Rfl:    lenalidomide (REVLIMID) 5 MG capsule, Take 1 capsule (5 mg total) by mouth daily. 5 mg daily for 14 days and 2 week off.  Museum/gallery conservator # [id number]  Date Obtained12/29/2022, Disp: 10 capsule, Rfl: 0   polyethylene glycol (MIRALAX / GLYCOLAX) 17 g packet, Take 17 g by mouth daily., Disp: , Rfl:    propranolol (INDERAL) 40 MG tablet, TAKE 1 TABLET BY MOUTH TWICE A DAY, Disp: 180 tablet, Rfl: 1   sulfaSALAzine (AZULFIDINE) 500 MG tablet, TAKE 2 TABLETS BY MOUTH TWICE A DAY. *PLEASE KEEP SCHEDULED APPOINTMENT FOR FURTHER REFILLS.* (Patient taking differently: TAKE 2 TABLETS BY MOUTH TWICE A DAY. *PLEASE KEEP SCHEDULED APPOINTMENT FOR FURTHER REFILLS.* pt states taking 2 in morning and one in the evening.), Disp: 120 tablet, Rfl: 4   Wheat Dextrin (BENEFIBER PO), Take 1 Dose by mouth as needed. 1 tablespoon daily, Disp: , Rfl:    dexamethasone (DECADRON) 4 MG tablet, Take 2 tablets (8 mg total) by mouth once a week. Take with breakfast., Disp: 20 tablet, Rfl: 2  Physical exam:  Vitals:   05/27/21 1442  BP: (!) 118/48  Pulse: 73  Resp: 18  Temp: 98.2 F (36.8 C)  TempSrc: Tympanic  SpO2: 100%  Weight: 126 lb (57.2 kg)  Height: 5' (1.524 m)   Physical Exam Constitutional:      Comments: Elderly frail woman sitting in a wheelchair.  Appears in no acute distress  Cardiovascular:     Rate and Rhythm: Normal rate and  regular rhythm.     Heart sounds: Normal heart sounds.  Pulmonary:     Effort: Pulmonary effort is normal.     Breath sounds: Normal breath sounds.  Musculoskeletal:     Right lower leg: No edema.     Left lower leg: No edema.  Skin:    General: Skin is warm and dry.  Neurological:     Mental Status: She is alert and oriented to person, place, and time.     CMP Latest Ref Rng & Units 05/27/2021  Glucose 70 - 99 mg/dL 141(H)  BUN 8 - 23 mg/dL 33(H)  Creatinine 0.44 - 1.00 mg/dL 1.59(H)  Sodium 135 - 145 mmol/L 138  Potassium 3.5 - 5.1 mmol/L 4.1  Chloride 98 - 111 mmol/L 105  CO2 22 - 32 mmol/L 24  Calcium 8.9 - 10.3 mg/dL 8.4(L)  Total Protein 6.5 - 8.1 g/dL 5.3(L)  Total Bilirubin 0.3 - 1.2 mg/dL 0.5  Alkaline Phos 38 - 126 U/L 75  AST 15 - 41 U/L 18  ALT 0 - 44 U/L 21   CBC Latest Ref Rng & Units 05/27/2021  WBC 4.0 - 10.5 K/uL 24.1(H)  Hemoglobin 12.0 - 15.0 g/dL 9.0(L)  Hematocrit 36.0 - 46.0 % 28.1(L)  Platelets 150 - 400 K/uL 303     Assessment and plan- Patient is a 88 y.o. female with history of kappa light chain multiple myeloma here for routine follow-up  Discussed with the patient that her issues of memory follow-up as well as fatigue could be secondary to Revlimid.  She is currently on 5 mg 2 weeks on and 2 weeks off.She is also taking Decadron 10 mg weekly.  After starting her myeloma treatment her serum free light chain ratio has normalized although serum Still mildly elevated at 24 but significantly reduced from a high value of 5618 5 months ago.  Myeloma panel continues to show no M protein.  Patient does report more energy as well as better control of back pain when she is on weekly Decadron.  She has not encountered any problems with hyperglycemia so far or leg swelling.  She will therefore continue Decadron 10 mg  weekly.  We discussed following strategies with regards to Revlimid moving forward:  Reduce the dose of Revlimid to 2.5 mg twice daily 2 weeks on  2 weeks off and see if she tolerates it better  Versus stopping Revlimid and steroids altogether and monitor her myeloma conservatively and consider treatment if serum free light chain ratio starts going up again.  Patient would like to continue her treatments for myeloma at this time but is willing to try a lower dose of Revlimid.  We will therefore reduce the dose of her Revlimid to 2.5 mg 2 weeks on and 2 weeks off and she is going to be starting her next cycle on June 14, 2021.  I will tentatively see her on 10/03/2021 with CBC with differential CMP myeloma panel and serum free light chains and see how she is doing with the new dose   Visit Diagnosis 1. High risk medication use   2. Multiple myeloma not having achieved remission ([place])   3. Neutrophilia      Dr. Randa Evens, MD, MPH [facility] [phone] 05/28/2021 11:20 AM

## 2021-05-29 LAB — KAPPA/LAMBDA LIGHT CHAINS
Kappa free light chain: 23.2 mg/L — ABNORMAL HIGH (ref 3.3–19.4)
Kappa, lambda light chain ratio: 1.41 (ref 0.26–1.65)
Lambda free light chains: 16.4 mg/L (ref 5.7–26.3)

## 2021-06-01 LAB — MULTIPLE MYELOMA PANEL, SERUM
Albumin SerPl Elph-Mcnc: 2.9 g/dL (ref 2.9–4.4)
Albumin/Glob SerPl: 1.4 (ref 0.7–1.7)
Alpha 1: 0.3 g/dL (ref 0.0–0.4)
Alpha2 Glob SerPl Elph-Mcnc: 0.7 g/dL (ref 0.4–1.0)
B-Globulin SerPl Elph-Mcnc: 0.8 g/dL (ref 0.7–1.3)
Gamma Glob SerPl Elph-Mcnc: 0.3 g/dL — ABNORMAL LOW (ref 0.4–1.8)
Globulin, Total: 2.1 g/dL — ABNORMAL LOW (ref 2.2–3.9)
IgA: 78 mg/dL (ref 64–422)
IgG (Immunoglobin G), Serum: 355 mg/dL — ABNORMAL LOW (ref 586–1602)
IgM (Immunoglobulin M), Srm: 18 mg/dL — ABNORMAL LOW (ref 26–217)
Total Protein ELP: 5 g/dL — ABNORMAL LOW (ref 6.0–8.5)

## 2021-06-02 ENCOUNTER — Telehealth: Payer: Self-pay | Admitting: *Deleted

## 2021-06-02 NOTE — Telephone Encounter (Signed)
Pt called yest. About asking questions about she has decided that she will take 2.5 mg 2 weeks on and 2 weeks off. She stopped the revlimid 1/20 of 5 mg and she is waiting for 2 weeks before starting the new dose. Also with her revlimid has decreased the dexamethasone is now 8 mg weekly. She wanted to know if she can take the steroid on the weeks off and she can. Today when I called her she wanted to know why Dr. Hilarie Fredrickson is not in her chart as a doctor with [place] on her team. I told I did not know but will try to look into it.

## 2021-06-03 ENCOUNTER — Other Ambulatory Visit: Payer: Self-pay | Admitting: *Deleted

## 2021-06-05 ENCOUNTER — Telehealth: Payer: Self-pay | Admitting: *Deleted

## 2021-06-05 NOTE — Telephone Encounter (Signed)
Called pt and let her know that the myeloma panel did not shot M spike. She already got the results of the kappa lambda light chains and it was 1 pint less on kappa free light chains, the ratio was 1.41 which is better than before. She had a tremendous result with the labs when she first started and then she had side effects and could not tolerate the med for that many days. She now wants to try 2.5 mg and stopped the 5 mg on 1/20. She wants to start with 2.5 mg 2 weeks on and 2 weeks off. Pt wants to start 2/6. We will send rx in to get it started.

## 2021-06-08 ENCOUNTER — Other Ambulatory Visit: Payer: Self-pay | Admitting: Pharmacist

## 2021-06-08 ENCOUNTER — Encounter: Payer: Self-pay | Admitting: Oncology

## 2021-06-08 DIAGNOSIS — C9000 Multiple myeloma not having achieved remission: Secondary | ICD-10-CM

## 2021-06-08 MED ORDER — LENALIDOMIDE 2.5 MG PO CAPS: 2.5000 mg | ORAL_CAPSULE | Freq: Every day | ORAL | 0 refills | Status: DC

## 2021-06-09 NOTE — Telephone Encounter (Signed)
Diana Brewer has sent the prescription for the revlimid today.

## 2021-06-12 ENCOUNTER — Telehealth: Payer: Self-pay | Admitting: *Deleted

## 2021-06-12 MED ORDER — DEXAMETHASONE 4 MG PO TABS: 16.0000 mg | ORAL_TABLET | ORAL | 2 refills | Status: DC

## 2021-06-12 NOTE — Telephone Encounter (Signed)
Pt called to say that she wants to increase the dexamethasone. At 8 mg it is not helping her back pain and felt better when she was on higher dose. I spoke to Janese Banks and she said she can have 16 mg once a week. I called the pt and she is good with that . She will use the amount she has now and I am sending  anew rx now. Pt ok with this.

## 2021-06-17 ENCOUNTER — Telehealth: Payer: Self-pay | Admitting: Family Medicine

## 2021-06-17 NOTE — Chronic Care Management (AMB) (Signed)
°  Chronic Care Management   Note  06/17/2021 Name: ZAKARIA FROMER MRN: [id number] DOB: 03/29/1934  CORTLYN CANNELL is a 88 y.o. year old female who is a primary care patient of [name], MD. I reached out to Dartha Lodge by phone today in response to a referral sent by Ms. Garen Grams PCP, [name], MD.   Ms. Bratcher was given information about Chronic Care Management services today including:  CCM service includes personalized support from designated clinical staff supervised by her physician, including individualized plan of care and coordination with other care providers 24/7 contact phone numbers for assistance for urgent and routine care needs. Service will only be billed when office clinical staff spend 20 minutes or more in a month to coordinate care. Only one practitioner may furnish and bill the service in a calendar month. The patient may stop CCM services at any time (effective at the end of the month) by phone call to the office staff.   Patient agreed to services and verbal consent obtained.   Follow up plan:   Tatjana Secretary/administrator

## 2021-07-06 ENCOUNTER — Encounter: Payer: Self-pay | Admitting: Oncology

## 2021-07-06 ENCOUNTER — Other Ambulatory Visit: Payer: Self-pay | Admitting: *Deleted

## 2021-07-06 ENCOUNTER — Inpatient Hospital Stay (HOSPITAL_BASED_OUTPATIENT_CLINIC_OR_DEPARTMENT_OTHER): Payer: Medicare Other | Admitting: Oncology

## 2021-07-06 ENCOUNTER — Other Ambulatory Visit: Payer: Self-pay

## 2021-07-06 ENCOUNTER — Inpatient Hospital Stay: Payer: Medicare Other | Attending: Oncology

## 2021-07-06 VITALS — BP 114/52 | HR 70 | Temp 96.2°F | Resp 20

## 2021-07-06 DIAGNOSIS — Z7961 Long term (current) use of immunomodulator: Secondary | ICD-10-CM | POA: Insufficient documentation

## 2021-07-06 DIAGNOSIS — M549 Dorsalgia, unspecified: Secondary | ICD-10-CM | POA: Insufficient documentation

## 2021-07-06 DIAGNOSIS — Z888 Allergy status to other drugs, medicaments and biological substances status: Secondary | ICD-10-CM | POA: Diagnosis not present

## 2021-07-06 DIAGNOSIS — R5383 Other fatigue: Secondary | ICD-10-CM | POA: Insufficient documentation

## 2021-07-06 DIAGNOSIS — Z8249 Family history of ischemic heart disease and other diseases of the circulatory system: Secondary | ICD-10-CM | POA: Insufficient documentation

## 2021-07-06 DIAGNOSIS — S22079A Unspecified fracture of T9-T10 vertebra, initial encounter for closed fracture: Secondary | ICD-10-CM | POA: Diagnosis not present

## 2021-07-06 DIAGNOSIS — Z8261 Family history of arthritis: Secondary | ICD-10-CM | POA: Insufficient documentation

## 2021-07-06 DIAGNOSIS — C9000 Multiple myeloma not having achieved remission: Secondary | ICD-10-CM | POA: Diagnosis not present

## 2021-07-06 DIAGNOSIS — Z885 Allergy status to narcotic agent status: Secondary | ICD-10-CM | POA: Insufficient documentation

## 2021-07-06 DIAGNOSIS — Z7189 Other specified counseling: Secondary | ICD-10-CM

## 2021-07-06 DIAGNOSIS — Z79899 Other long term (current) drug therapy: Secondary | ICD-10-CM | POA: Insufficient documentation

## 2021-07-06 DIAGNOSIS — Z823 Family history of stroke: Secondary | ICD-10-CM | POA: Insufficient documentation

## 2021-07-06 DIAGNOSIS — Z801 Family history of malignant neoplasm of trachea, bronchus and lung: Secondary | ICD-10-CM | POA: Insufficient documentation

## 2021-07-06 DIAGNOSIS — Z7952 Long term (current) use of systemic steroids: Secondary | ICD-10-CM | POA: Diagnosis not present

## 2021-07-06 DIAGNOSIS — Z808 Family history of malignant neoplasm of other organs or systems: Secondary | ICD-10-CM | POA: Diagnosis not present

## 2021-07-06 DIAGNOSIS — D72829 Elevated white blood cell count, unspecified: Secondary | ICD-10-CM | POA: Insufficient documentation

## 2021-07-06 LAB — CBC WITH DIFFERENTIAL/PLATELET
Abs Immature Granulocytes: 0.28 10*3/uL — ABNORMAL HIGH (ref 0.00–0.07)
Basophils Absolute: 0.1 10*3/uL (ref 0.0–0.1)
Basophils Relative: 0 %
Eosinophils Absolute: 0 10*3/uL (ref 0.0–0.5)
Eosinophils Relative: 0 %
HCT: 24.6 % — ABNORMAL LOW (ref 36.0–46.0)
Hemoglobin: 7.9 g/dL — ABNORMAL LOW (ref 12.0–15.0)
Immature Granulocytes: 2 %
Lymphocytes Relative: 4 %
Lymphs Abs: 0.8 10*3/uL (ref 0.7–4.0)
MCH: 32.2 pg (ref 26.0–34.0)
MCHC: 32.1 g/dL (ref 30.0–36.0)
MCV: 100.4 fL — ABNORMAL HIGH (ref 80.0–100.0)
Monocytes Absolute: 2.3 10*3/uL — ABNORMAL HIGH (ref 0.1–1.0)
Monocytes Relative: 12 %
Neutro Abs: 15.9 10*3/uL — ABNORMAL HIGH (ref 1.7–7.7)
Neutrophils Relative %: 82 %
Platelets: 426 10*3/uL — ABNORMAL HIGH (ref 150–400)
RBC: 2.45 MIL/uL — ABNORMAL LOW (ref 3.87–5.11)
RDW: 17.7 % — ABNORMAL HIGH (ref 11.5–15.5)
WBC: 19.3 10*3/uL — ABNORMAL HIGH (ref 4.0–10.5)
nRBC: 0 % (ref 0.0–0.2)

## 2021-07-06 LAB — COMPREHENSIVE METABOLIC PANEL
ALT: 25 U/L (ref 0–44)
AST: 24 U/L (ref 15–41)
Albumin: 2.7 g/dL — ABNORMAL LOW (ref 3.5–5.0)
Alkaline Phosphatase: 76 U/L (ref 38–126)
Anion gap: 11 (ref 5–15)
BUN: 44 mg/dL — ABNORMAL HIGH (ref 8–23)
CO2: 23 mmol/L (ref 22–32)
Calcium: 8.2 mg/dL — ABNORMAL LOW (ref 8.9–10.3)
Chloride: 100 mmol/L (ref 98–111)
Creatinine, Ser: 1.8 mg/dL — ABNORMAL HIGH (ref 0.44–1.00)
GFR, Estimated: 27 mL/min — ABNORMAL LOW (ref >60)
Glucose, Bld: 194 mg/dL — ABNORMAL HIGH (ref 70–99)
Potassium: 3.9 mmol/L (ref 3.5–5.1)
Sodium: 134 mmol/L — ABNORMAL LOW (ref 135–145)
Total Bilirubin: 0.1 mg/dL — ABNORMAL LOW (ref 0.3–1.2)
Total Protein: 4.9 g/dL — ABNORMAL LOW (ref 6.5–8.1)

## 2021-07-06 MED ORDER — LENALIDOMIDE 2.5 MG PO CAPS: 2.5000 mg | ORAL_CAPSULE | Freq: Every day | ORAL | 0 refills | Status: AC

## 2021-07-07 LAB — KAPPA/LAMBDA LIGHT CHAINS
Kappa free light chain: 34 mg/L — ABNORMAL HIGH (ref 3.3–19.4)
Kappa, lambda light chain ratio: 1.71 — ABNORMAL HIGH (ref 0.26–1.65)
Lambda free light chains: 19.9 mg/L (ref 5.7–26.3)

## 2021-07-08 ENCOUNTER — Telehealth: Payer: Self-pay

## 2021-07-08 NOTE — Progress Notes (Signed)
? ? ?Chronic Care Management ?Pharmacy Assistant  ? ?Name: Diana Brewer  MRN: [id number] DOB: 22-Oct-1933 ? ?Reason for Encounter: CCM (Initial Questions) ?  ?Recent office visits:  ?03/06/2021 - Diana Bush, MD - Prolia injections had been on hold since April 2022; injections have been cancelled.  ?02/09/2021 - Diana Bush, MD - Telephone - Referral to Hematology/Oncology.  ? ?Recent consult visits:  ?07/06/2021 - Diana Evens, MD - Oncology - Patient presented for multiple myeloma not having achieved remission. No other information documented.  ?05/27/2021 - Diana Evens, MD - Oncology - Patient presented for multiple myeloma not having achieved remission. Change: dexamethasone (DECADRON) 4 MG tablet vs. 20 mg weekly. Change: lenalidomide (REVLIMID) 2.5 MG capsule take 1 capsule daily vs. Take for 14 days and 2 weeks off.  ?05/05/2021 - Diana Evens, MD - Oncology - Telephone - Change: Revlimid 5 mg 2 weeks on and 2 weeks off vs. 3 weeks on and 1 week off due to blurred vision and swallowing concerns.  ?04/30/2021 - Diana Casa, NP - Oncology - Patient presented for multiple myeloma not having achieved remission. No medication changes.  ?04/22/2021 - Diana Jarred, MD - Gastroenterology - Patient presented for ulcerative colitis. Use Pepto-Bismol OTC if needed for loose stools or even Imodium.  ?04/07/2021 - Diana Majestic, MD - Cardiology - Patient presented for paroxysmal atrial fibrillation. Ordered: EKG. No medication changes.  ?03/27/2021 - Diana Evens, MD - Oncology - Patient presented for multiple myeloma not having achieved remission. Stop due to completed course: acetaminophen (TYLENOL) 500 MG tablet. Stop due to completed course: mesalamine (ROWASA) 4 g enema. Stop due to completed course: Multiple Vitamins-Minerals (CENTRUM SILVER) tablet.  ?03/09/2021 - Oncology - Telephone - Stop Revlimid per Dr. Marin Olp as 2nd opinion. Change: Folic Acid 017 mg instead of 1 mg.  ?02/27/2021 - Diana Gauze,  MD - Oncology - Patient presented for multiple myeloma not having achieved remission. All oncology labs abnormal; no provider notes. PET scan which showed a hypermetabolic lytic lesion in the left acetabulum. No medication changes.  ?02/25/2021 - Diana Evens, MD - Oncology - Patient presented for multiple myeloma not having achieved remission. Lab results: "Folate is low" Sent Folic Acid 1 mg daily.  ?01/27/2021 - Diana Evens, MD - Oncology - Patient presented for multiple myeloma not having achieved remission. No medication changes.  ? ?Hospital visits:  ?None in previous 6 months ? ?Medications: ?Outpatient Encounter Medications as of 07/08/2021  ?Medication Sig Note  ? aspirin EC 81 MG tablet Take 1 tablet (81 mg total) by mouth daily. Swallow whole.   ? Cholecalciferol (VITAMIN D3) 25 MCG (1000 UT) CAPS Take 1 capsule (1,000 Units total) by mouth daily.   ? dexamethasone (DECADRON) 4 MG tablet Take 4 tablets (16 mg total) by mouth once a week.   ? diltiazem (CARDIZEM CD) 180 MG 24 hr capsule TAKE 1 CAPSULE BY MOUTH EVERY DAY   ? ezetimibe (ZETIA) 10 MG tablet TAKE ONE TABLET BY MOUTH DAILY   ? folic acid (FOLVITE) 1 MG tablet Take 1 tablet (1 mg total) by mouth daily. 03/27/2021: Pt found 800 and not 1 mg otc  ? lenalidomide (REVLIMID) 2.5 MG capsule Take 1 capsule (2.5 mg total) by mouth daily. Take for 14 days, then hold for 14 days. Repeat every 28 days.  REMS# [id number] on 07/06/2021   ? polyethylene glycol (MIRALAX / GLYCOLAX) 17 g packet Take 17 g by mouth daily.   ? propranolol (INDERAL) 40 MG tablet TAKE  1 TABLET BY MOUTH TWICE A DAY   ? sulfaSALAzine (AZULFIDINE) 500 MG tablet TAKE 2 TABLETS BY MOUTH TWICE A DAY. *PLEASE KEEP SCHEDULED APPOINTMENT FOR FURTHER REFILLS.* (Patient taking differently: TAKE 2 TABLETS BY MOUTH TWICE A DAY. *PLEASE KEEP SCHEDULED APPOINTMENT FOR FURTHER REFILLS.* pt states taking 2 in morning and one in the evening.) 03/27/2021: 2 in am and 1 at night ?  ? Wheat Dextrin (BENEFIBER  PO) Take 1 Dose by mouth as needed. 1 tablespoon daily   ? ?No facility-administered encounter medications on file as of 07/08/2021.  ? ?No results found for: HGBA1C, MICROALBUR  ? ?BP Readings from Last 3 Encounters:  ?07/06/21 (!) 114/52  ?05/27/21 (!) 118/48  ?04/30/21 122/71  ? ?Patient contacted to review initial questions prior to visit with Diana Brewer. ? ?Spoke with patient and reminded them to have all medications, supplements and any blood glucose and blood pressure readings available for review with pharmacist, at their telephone visit on 07/10/2021 at 9:00.   ? ?Star Rating Drugs:  ?Medication:  Last Fill: Day Supply ?No star rating drugs noted ? ?Care Gaps: ?Annual wellness visit in last year? Yes 10/13/2020 ?Most Recent BP reading: 114/52 on 07/06/2021 ? ?Diana Brewer, CPP notified ? ?Diana Brewer, RMA ?Clinical Pharmacy Assistant ?[phone] ? ?Time Spent: 60 Minutes ? ? ?

## 2021-07-09 ENCOUNTER — Inpatient Hospital Stay: Payer: Medicare Other | Attending: Oncology | Admitting: Hospice and Palliative Medicine

## 2021-07-09 ENCOUNTER — Telehealth: Payer: Self-pay | Admitting: *Deleted

## 2021-07-09 DIAGNOSIS — C9000 Multiple myeloma not having achieved remission: Secondary | ICD-10-CM | POA: Insufficient documentation

## 2021-07-09 DIAGNOSIS — R531 Weakness: Secondary | ICD-10-CM | POA: Insufficient documentation

## 2021-07-09 DIAGNOSIS — R5383 Other fatigue: Secondary | ICD-10-CM | POA: Insufficient documentation

## 2021-07-09 DIAGNOSIS — G893 Neoplasm related pain (acute) (chronic): Secondary | ICD-10-CM

## 2021-07-09 MED ORDER — HYDROCODONE-ACETAMINOPHEN 5-325 MG PO TABS: 1.0000 | ORAL_TABLET | Freq: Four times a day (QID) | ORAL | 0 refills | Status: DC | PRN

## 2021-07-09 MED ORDER — ONDANSETRON HCL 4 MG PO TABS: 4.0000 mg | ORAL_TABLET | Freq: Three times a day (TID) | ORAL | 0 refills | Status: AC | PRN

## 2021-07-09 NOTE — Progress Notes (Signed)
Virtual Visit via Telephone Note ? ?I connected with Diana Brewer on 07/09/21 at  3:00 PM EST by telephone and verified that I am speaking with the correct person using two identifiers. ? ?Location: ?Patient: Home ?Provider: Clinic ?  ?I discussed the limitations, risks, security and privacy concerns of performing an evaluation and management service by telephone and the availability of in person appointments. I also discussed with the patient that there may be a patient responsible charge related to this service. The patient expressed understanding and agreed to proceed. ? ? ?History of Present Illness: ?Diana Brewer is an 88 yo woman with multiple medical problems including multiple myeloma. She has history of back pain with previous endplate fracture of T9, T5, T7, and T11. Patient has been previously seen by neurosurgery with recommendation for medical management.  ? ?Patient started on Revlimid in August 2022.  ? ?Patient last saw Dr. Janese Banks on 05/27/21 at which time back pain had improved after starting on treatment for myeloma.  ? ?Telephone visit requested today due to reported worsening pain.  ?  ?Observations/Objective: ?I spoke with patient's daughter. Family report that patient has had worsening back pain over the past several days. Reportedly, patient is now unable to get out of bed or stand due to pain. Patient is not overtly weak. They have given her Tylenol without significant improvement. No falls or injuries reported.  ? ?Assessment and Plan: ?Back pain - likely neoplasm related. I am concerned for recurrent pathological fracture. I highly recommended clinic eval and imaging but daughter did not think they would be able to physically get patient to clinic without better pain control. We discussed ER but family declined this. They would like something stronger to give her for pain. Will Rx Norco. If pain is improved, will try to obtain imaging of spine (PET vs MRI).  ? ?Case and plan discussed  with Dr. Janese Banks.  ? ?Follow Up Instructions: ?TBD ?  ?I discussed the assessment and treatment plan with the patient. The patient was provided an opportunity to ask questions and all were answered. The patient agreed with the plan and demonstrated an understanding of the instructions. ?  ?The patient was advised to call back or seek an in-person evaluation if the symptoms worsen or if the condition fails to improve as anticipated. ? ?I provided 15 minutes of non-face-to-face time during this encounter. ? ? ?Irean Hong, NP ? ? ?

## 2021-07-09 NOTE — Telephone Encounter (Signed)
RN checked VM for team Janese Banks who is out of the clinic today. Multiple messages had been left regarding patient having pain and needing medication.  RN called and spoke with pt daughter who states pt was in a lot of pain and need pain medication.  Daughter stated pt is unable to get out of bed and has not eaten or drank any fluids since last night.  RN offered symptom management visit but daughter and pt stated she could not get in for an appt because of her pain.  RN stated she would talk with a provider and myself or the provider would call back. Daughter and caregiver verbalized understanding.   ?

## 2021-07-10 ENCOUNTER — Encounter: Payer: Self-pay | Admitting: Oncology

## 2021-07-10 ENCOUNTER — Other Ambulatory Visit: Payer: Self-pay | Admitting: *Deleted

## 2021-07-10 ENCOUNTER — Telehealth: Payer: Medicare Other

## 2021-07-10 ENCOUNTER — Telehealth: Payer: Self-pay | Admitting: Pharmacist

## 2021-07-10 MED ORDER — HYDROCODONE-ACETAMINOPHEN 5-325 MG PO TABS: 1.0000 | ORAL_TABLET | Freq: Four times a day (QID) | ORAL | 0 refills | Status: AC | PRN

## 2021-07-10 NOTE — Progress Notes (Unsigned)
Chronic Care Management Pharmacy Note  07/10/2021 Name:  Diana Brewer MRN:  [id number] DOB:  Aug 09, 1933  Summary: ***  Recommendations/Changes made from today's visit: ***  Plan: ***   Subjective: Diana Brewer is an 88 y.o. year old female who is a primary patient of Diana Bush, MD.  The CCM team was consulted for assistance with disease management and care coordination needs.    Engaged with patient by telephone for initial visit in response to provider referral for pharmacy case management and/or care coordination services.   Consent to Services:  The patient was given the following information about Chronic Care Management services today, agreed to services, and gave verbal consent: 1. CCM service includes personalized support from designated clinical staff supervised by the primary care provider, including individualized plan of care and coordination with other care providers 2. 24/7 contact phone numbers for assistance for urgent and routine care needs. 3. Service will only be billed when office clinical staff spend 20 minutes or more in a month to coordinate care. 4. Only one practitioner may furnish and bill the service in a calendar month. 5.The patient may stop CCM services at any time (effective at the end of the month) by phone call to the office staff. 6. The patient will be responsible for cost sharing (co-pay) of up to 20% of the service fee (after annual deductible is met). Patient agreed to services and consent obtained.  Patient Care Team: Diana Bush, MD as PCP - General (Family Medicine) Diana Sine, MD as PCP - Cardiology (Cardiology) Diana Guadeloupe, MD as Consulting Physician (Hematology and Oncology) [name], [name], MD as Consulting Physician (Gastroenterology) Diana Brewer, [facility] as Pharmacist (Pharmacist)  Recent office visits: 03/06/2021 - Diana Bush, MD - Prolia injections had been on hold since April 2022; injections  have been cancelled in light of oncology workup. 02/09/2021 - Diana Bush, MD - Telephone - Referral to Hematology/Oncology.   Recent consult visits: 07/09/21 NP Altha Harm (oncology) VV: f/u neoplasm-related pain; concern for recurrent fracture, advised PET/MRI however pt unable to get to clinic due to pain. Rx'd NORCO 5-325, zofran. 07/06/2021 - Diana Evens, MD - Oncology - Patient presented for multiple myeloma not having achieved remission. No other information documented.  06/12/21 Oncology phone message - increased dexamethasone to 16 mg weekly for pain. 05/27/2021 - Diana Evens, MD - Oncology - Patient presented for multiple myeloma not having achieved remission. Reduced dexamethasone from 20 to 8 mg weekly. Reduced Revlimid from 5 mg to 2.5 mg daily (2 weeks on, 2 weeks off) 05/05/2021 - Diana Evens, MD - Oncology - Telephone - Change: Revlimid 5 mg 2 weeks on and 2 weeks off vs. 3 weeks on and 1 week off due to blurred vision and swallowing concerns.  04/30/2021 - Diana Casa, NP - Oncology - Patient presented for multiple myeloma not having achieved remission. No medication changes.  04/22/2021 - Diana Jarred, MD - Gastroenterology - Patient presented for ulcerative colitis. Use Pepto-Bismol OTC if needed for loose stools or even Imodium.  04/07/2021 - Diana Majestic, MD - Cardiology - Patient presented for paroxysmal atrial fibrillation. Ordered: EKG. No medication changes.  03/27/2021 - Diana Evens, MD - Oncology - Patient presented for multiple myeloma not having achieved remission. Stop due to completed course: acetaminophen (TYLENOL) 500 MG tablet. Stop due to completed course: mesalamine (ROWASA) 4 g enema. Stop due to completed course: Multiple Vitamins-Minerals (CENTRUM SILVER) tablet.  03/09/2021 - Oncology - Telephone -  Stop Revlimid per Dr. Marin Olp as 2nd opinion. Change: Folic Acid 751 mg instead of 1 mg.  02/27/2021 - Diana Gauze, MD - Oncology - Patient presented for  multiple myeloma not having achieved remission. All oncology labs abnormal; no provider notes. PET scan which showed a hypermetabolic lytic lesion in the left acetabulum. No medication changes.  02/25/2021 - Diana Evens, MD - Oncology - Patient presented for multiple myeloma not having achieved remission. Lab results: "Diana Brewer is low" Sent Folic Acid 1 mg daily.  01/27/2021 - Diana Evens, MD - Oncology - Patient presented for multiple myeloma not having achieved remission. No medication changes  Hospital visits: {Hospital DC Yes/No:25215}   Objective:  Lab Results  Component Value Date   CREATININE 1.80 (H) 07/06/2021   BUN 44 (H) 07/06/2021   GFR 36.05 (L) 11/04/2020   GFRNONAA 27 (L) 07/06/2021   GFRAA 39 (L) 07/01/2020   NA 134 (L) 07/06/2021   K 3.9 07/06/2021   CALCIUM 8.2 (L) 07/06/2021   CO2 23 07/06/2021   GLUCOSE 194 (H) 07/06/2021    Lab Results  Component Value Date/Time   GFR 36.05 (L) 11/04/2020 10:12 AM   GFR 30.55 (L) 10/15/2020 10:23 AM    Last diabetic Eye exam: No results found for: HMDIABEYEEXA  Last diabetic Foot exam: No results found for: HMDIABFOOTEX   Lab Results  Component Value Date   CHOL 183 06/24/2020   HDL 60 06/24/2020   LDLCALC 104 (H) 06/24/2020   LDLDIRECT 132.6 12/27/2012   TRIG 104 06/24/2020   CHOLHDL 3.1 06/24/2020    Hepatic Function Latest Ref Rng & Units 07/06/2021 05/27/2021 04/30/2021  Total Protein 6.5 - 8.1 g/dL 4.9(L) 5.3(L) 5.2(L)  Albumin 3.5 - 5.0 g/dL 2.7(L) 3.3(L) 3.1(L)  AST 15 - 41 U/L 24 18 14(L)  ALT 0 - 44 U/L 25 21 22   Alk Phosphatase 38 - 126 U/L 76 75 74  Total Bilirubin 0.3 - 1.2 mg/dL 0.1(L) 0.5 0.5  Bilirubin, Direct <=0.2 mg/dL - - -    Lab Results  Component Value Date/Time   TSH 4.54 (H) 09/12/2020 09:41 AM   TSH 7.080 (H) 06/24/2020 09:14 AM   FREET4 0.91 09/12/2020 09:41 AM    CBC Latest Ref Rng & Units 07/06/2021 05/27/2021 04/30/2021  WBC 4.0 - 10.5 K/uL 19.3(H) 24.1(H) 13.6(H)  Hemoglobin 12.0  - 15.0 g/dL 7.9(L) 9.0(L) 9.0(L)  Hematocrit 36.0 - 46.0 % 24.6(L) 28.1(L) 28.1(L)  Platelets 150 - 400 K/uL 426(H) 303 312    Lab Results  Component Value Date/Time   VD25OH 48.26 09/12/2020 09:41 AM    Clinical ASCVD: {YES/NO:21197} The ASCVD Risk score (Arnett DK, et al., 2019) failed to calculate for the following reasons:   The 2019 ASCVD risk score is only valid for ages 29 to 9    Depression screen PHQ 2/9 10/23/2020 04/28/2016  Decreased Interest 3 0  Down, Depressed, Hopeless 3 0  PHQ - 2 Score 6 0  Altered sleeping 0 -  Tired, decreased energy 0 -  Change in appetite 0 -  Feeling bad or failure about yourself  0 -  Trouble concentrating 0 -  Moving slowly or fidgety/restless 0 -  Suicidal thoughts 0 -  PHQ-9 Score 6 -  Difficult doing work/chores Somewhat difficult -    CHA2DS2/VAS Stroke Risk Points  Current as of yesterday     4 >= 2 Points: High Risk  1 - 1.99 Points: Medium Risk  0 Points: Low Risk  Last Change: N/A      Details    This score determines the patient's risk of having a stroke if the  patient has atrial fibrillation.       Points Metrics  0 Has Congestive Heart Failure:  No    Current as of yesterday  0 Has Vascular Disease:  No    Current as of yesterday  1 Has Hypertension:  Yes    Current as of yesterday  2 Age:  36    Current as of yesterday  0 Has Diabetes:  No    Current as of yesterday  0 Had Stroke:  No  Had TIA:  No  Had Thromboembolism:  No    Current as of yesterday  1 Female:  Yes    Current as of yesterday    Social History   Tobacco Use  Smoking Status Never  Smokeless Tobacco Never   BP Readings from Last 3 Encounters:  07/06/21 (!) 114/52  05/27/21 (!) 118/48  04/30/21 122/71   Pulse Readings from Last 3 Encounters:  07/06/21 70  05/27/21 73  04/30/21 75   Wt Readings from Last 3 Encounters:  05/27/21 126 lb (57.2 kg)  04/30/21 136 lb (61.7 kg)  04/22/21 136 lb (61.7 kg)   BMI Readings from Last  3 Encounters:  05/27/21 24.61 kg/m  04/30/21 26.56 kg/m  04/22/21 26.56 kg/m    Assessment/Interventions: Review of patient past medical history, allergies, medications, health status, including review of consultants reports, laboratory and other test data, was performed as part of comprehensive evaluation and provision of chronic care management services.   SDOH:  (Social Determinants of Health) assessments and interventions performed: {yes/no:20286}  SDOH Screenings   Alcohol Screen: Low Risk    Last Alcohol Screening Score (AUDIT): 0  Depression (PHQ2-9): Medium Risk   PHQ-2 Score: 6  Financial Resource Strain: Low Risk    Difficulty of Paying Living Expenses: Not hard at all  Food Insecurity: No Food Insecurity   Worried About Charity fundraiser in the Last Year: Never true   Ran Out of Food in the Last Year: Never true  Housing: Low Risk    Last Housing Risk Score: 0  Physical Activity: Inactive   Days of Exercise per Week: 0 days   Minutes of Exercise per Session: 0 min  Social Connections: Not on file  Stress: No Stress Concern Present   Feeling of Stress : Not at all  Tobacco Use: Low Risk    Smoking Tobacco Use: Never   Smokeless Tobacco Use: Never   Passive Exposure: Not on file  Transportation Needs: No Transportation Needs   Lack of Transportation (Medical): No   Lack of Transportation (Non-Medical): No    CCM Care Plan  Allergies  Allergen Reactions   Tramadol Nausea Only   Codeine Other (See Comments)    nausea     Cortisone Swelling    Swelling    Guaifenesin Er Other (See Comments)    Doesn't work, made her sick    Medications Reviewed Today     Reviewed by Maia Breslow, CMA (Certified Medical Assistant) on 07/06/21 at 52  Med List Status: <None>   Medication Order Taking? Sig Documenting Provider Last Dose Status Informant  aspirin EC 81 MG tablet [id number] Yes Take 1 tablet (81 mg total) by mouth daily. Swallow whole. Diana Sine, MD Taking Active   Cholecalciferol (VITAMIN D3) 25 MCG (1000 UT) CAPS [id number] Yes Take 1  capsule (1,000 Units total) by mouth daily. Diana Bush, MD Taking Active   dexamethasone (DECADRON) 4 MG tablet [id number] Yes Take 4 tablets (16 mg total) by mouth once a week. Diana Guadeloupe, MD Taking Active   diltiazem [facility] CD) 180 MG 24 hr capsule [id number] Yes TAKE 1 CAPSULE BY MOUTH EVERY DAY Diana Sine, MD Taking Active   ezetimibe (ZETIA) 10 MG tablet [id number] Yes TAKE ONE TABLET BY MOUTH DAILY Diana Sine, MD Taking Active   folic acid (FOLVITE) 1 MG tablet [id number] Yes Take 1 tablet (1 mg total) by mouth daily. Diana Bush, MD Taking Active            Med Note Clifton James, Ardeth Sportsman   Fri Mar 27, 2021  2:19 PM) Pt found 800 and not 1 mg otc  lenalidomide (REVLIMID) 2.5 MG capsule [id number] Yes Take 1 capsule (2.5 mg total) by mouth daily. Take for 14 days, then hold for 14 days. Repeat every 28 days. Diana Guadeloupe, MD Taking Active   polyethylene glycol (MIRALAX / GLYCOLAX) 17 g packet [id number] Yes Take 17 g by mouth daily. [provider] Taking Active   propranolol (INDERAL) 40 MG tablet [id number] Yes TAKE 1 TABLET BY MOUTH TWICE A DAY Diana Sine, MD Taking Active   sulfaSALAzine (AZULFIDINE) 500 MG tablet [id number] Yes TAKE 2 TABLETS BY MOUTH TWICE A DAY. *PLEASE KEEP SCHEDULED APPOINTMENT FOR FURTHER REFILLS.*  Patient taking differently: TAKE 2 TABLETS BY MOUTH TWICE A DAY. *PLEASE KEEP SCHEDULED APPOINTMENT FOR FURTHER REFILLS.* pt states taking 2 in morning and one in the evening.   Jerene Bears, MD Taking Active            Med Note Clifton James, Ardeth Sportsman   Fri Mar 27, 2021  2:22 PM) 2 in am and 1 at night   Wheat Dextrin (BENEFIBER PO) [id number] Yes Take 1 Dose by mouth as needed. 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   Hypercalcemia 10/19/2020   Osteoporosis 09/16/2020   Acquired hypothyroidism 09/16/2020   New onset atrial fibrillation ([place]) 04/08/2020   Acute midline thoracic back pain 12/10/2019   Closed compression fracture of thoracic vertebra ([place]) 12/10/2019   CKD (chronic kidney disease) stage 3, GFR 30-59 ml/min (HCC) 11/26/2019   Macrocytic anemia 11/26/2019   History of COVID-19 03/2019   Medicare annual wellness visit, initial 04/28/2016   Advanced care planning/counseling discussion 04/28/2016   Wrist arthritis 04/28/2016   Abnormal LFTs (liver function tests) 02/26/2013   Benign hypertensive heart disease without heart failure 12/27/2012   Rapid palpitations 12/24/2010   Hypercholesterolemia 12/24/2010   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  Conditions to be addressed/monitored:  {USCCMDZASSESSMENTOPTIONS:23563}  There are no care plans that you recently modified to display for this patient.    Medication Assistance: {MEDASSISTANCEINFO:25044}  Compliance/Adherence/Medication fill history: Care Gaps: DEXA  Star-Rating Drugs: NONE  Patient's preferred pharmacy is:  [facility] [id number], [place] [address] Phone: [phone] Fax:  [phone]  [facility], [place] - [address] [place] [address] Phone: [phone] Fax: [phone]  [id number] [place] [place]: [phone]: [phone] Uses pill box? {Yes or If no, why not?:20788} Pt endorses ***% compliance  We discussed: {Pharmacy  options:24294} Patient decided to: {US Pharmacy Plan:23885}  Care Plan and Follow Up Patient Decision:  {FOLLOWUP:24991}  Plan: {CM FOLLOW UP PLMBE:67544} ***    Current Barriers:  {pharmacybarriers:24917}  Pharmacist Clinical Goal(s):  Patient will {PHARMACYGOALCHOICES:24921} through collaboration with PharmD and provider.   Interventions: 1:1 collaboration with GRia Bush MD regarding development and update of comprehensive plan of care as evidenced by provider attestation and co-signature Inter-disciplinary care team collaboration (see longitudinal plan of care) Comprehensive medication review performed; medication list updated in electronic medical record  Hyperlipidemia: (LDL goal < ***) -{US controlled/uncontrolled:25276} -Current treatment: Ezetimibe 10 mg daily -Medications previously tried: ***  -Current dietary patterns: *** -Current exercise habits: *** -Educated on {CCM HLD Counseling:25126} -{CCMPHARMDINTERVENTION:25122}  Atrial Fibrillation (Goal: prevent stroke and major bleeding) -{US controlled/uncontrolled:25276} -CHADSVASC: ***; no anticoagulation due to hx of UC and GI bleeds -Current treatment: Propranolol 40 mg BID Diltiazem 180 mg daily -Medications previously tried: metoprolol (migraines) -Home BP and HR readings: ***  -Counseled on {CCMAFIBCOUNSELING:25120} -{CCMPHARMDINTERVENTION:25122}  Osteoporosis / Osteopenia (Goal ***) -{US controlled/uncontrolled:25276} -Last DEXA Scan: ***   T-Score femoral neck: ***  T-Score total hip: ***  T-Score lumbar spine: ***  T-Score forearm radius: ***  10-year probability of major osteoporotic fracture: ***  10-year probability of hip fracture: *** -Patient {is;is not an osteoporosis candidate:23886} -Current treatment  *** -Medications previously tried: ***  -{Osteoporosis Counseling:23892} -{CCMPHARMDINTERVENTION:25122}  Ulcerative proctosigmoiditis (Goal: ***) -{US  controlled/uncontrolled:25276} -Current treatment  Sulfasalazine 500 mg - 2 tab BID Miralax Benefiber -Medications previously tried: mesalamine enema  -{CCMPHARMDINTERVENTION:25122}  Multiple myeloma (Goal: ***) -{US controlled/uncontrolled:25276} -Current treatment  Revlimid 2.5 mg daily (2 weeks on, 2 weeks off) Aspirin 81 mg daily Hydrocodone-APAP Ondansetron 4 mg Dexamethasone 16 mg weekly Folic acid 1 mg -Medications previously tried: ***  -{CCMPHARMDINTERVENTION:25122}  Health Maintenance -Vaccine gaps: *** -Current therapy:  Vitamin D 1000 IU -Educated on {ccm supplement counseling:25128} -{CCM Patient satisfied:25129} -{CCMPHARMDINTERVENTION:25122}  Patient Goals/Self-Care Activities Patient will:  - {pharmacypatientgoals:24919}

## 2021-07-10 NOTE — Telephone Encounter (Signed)
Diana Brewer called stating that the pharmacy does not have the pain medicine you ordered, it is on backorder and does not know when they will have any in stock. Patient is in pain and needs med ?

## 2021-07-10 NOTE — Telephone Encounter (Signed)
Contacted patient for Initial CCM visit. She declined CCM services but does need help finding Norco 5-325 mg Rx that was prescribed by Oncology yesterday. [facility], she reports her daughter called many other pharmacies and was unable to find it anywhere. ? ?I did find the medication in stock at [facility]. Coordinated with prescriber to send Rx to [facility] and deliver medication to patient today. ?

## 2021-07-10 NOTE — Progress Notes (Signed)
Hematology/Oncology Consult note [facility]  [phone] [phone]  Patient Care Team: [name], MD as PCP - General (Family Medicine) Troy Sine, MD as PCP - Cardiology (Cardiology) [name], MD as Consulting Physician (Hematology and Oncology) [name], [name], MD as Consulting Physician (Gastroenterology) [name], [facility] as Pharmacist (Pharmacist)   Name of the patient: Diana Brewer  [id number]  1934/02/08   Date of visit: 07/10/21  Diagnosis- Kappa light chain multiple myeloma  Chief complaint/ Reason for visit-routine follow-up of multiple myeloma  Heme/Onc history: Patient is a 88 year old female who has been having ongoing back pain for the last 1 year.  She underwent x-ray of her thoracic and lumbar spine which showed T9 inferior endplate fracture as well as remote T5 T7 and T11 fractures.  She was seen by Dr. Lacinda Axon from neurosurgery and was recommended medical management for her osteoporosis.  Her mobility has been affected because of her back pain.  She is mostly sedentary and will walk for short distances.  She had some work-up done by Dr. Danise Mina in June 2022 which showed elevated kappa free light chain of 3813 and ratio of 302.  Myeloma panel showed no M protein.   Patient refused to undergo bone marrow biopsy.PET CT scan showed lytic mottling lesions throughout the visualized osseous structures with single hypermetabolic lytic lesion in the medial left acetabulum concerning for multiple myeloma.  Hypermetabolic rounded soft tissue extending from L5-S1 disc space.  Mild bilateral adrenal hypermetabolism without CT correlate.  Focal hypermetabolism in the rectosigmoid colon without CT correlate.   Patient started on Revlimid in August 2022.  Diagnosis of multiple myeloma was made purely on the basis of free light chain ratio that was more than 100.  She continues to refuse additional treatments, bone  marrow biopsy and bisphosphonates and palliative radiation therapy  Interval history-patient is currently on low-dose Revlimid 5 mg 2 weeks on and 2 weeks of due to ongoing fatigue.  Back pain is improved as compared to initial presentation but still persists.  She is on 16 mg of Decadron once a week  ECOG PS- 3 Pain scale- 4  Review of systems- Review of Systems  Constitutional:  Positive for malaise/fatigue. Negative for chills, fever and weight loss.  HENT:  Negative for congestion, ear discharge and nosebleeds.   Eyes:  Negative for blurred vision.  Respiratory:  Negative for cough, hemoptysis, sputum production, shortness of breath and wheezing.   Cardiovascular:  Negative for chest pain, palpitations, orthopnea and claudication.  Gastrointestinal:  Negative for abdominal pain, blood in stool, constipation, diarrhea, heartburn, melena, nausea and vomiting.  Genitourinary:  Negative for dysuria, flank pain, frequency, hematuria and urgency.  Musculoskeletal:  Positive for back pain. Negative for joint pain and myalgias.  Skin:  Negative for rash.  Neurological:  Negative for dizziness, tingling, focal weakness, seizures, weakness and headaches.  Endo/Heme/Allergies:  Does not bruise/bleed easily.  Psychiatric/Behavioral:  Negative for depression and suicidal ideas. The patient does not have insomnia.       Allergies  Allergen Reactions   Tramadol Nausea Only   Codeine Other (See Comments)    nausea     Cortisone Swelling    Swelling    Guaifenesin Er Other (See Comments)    Doesn't work, made her sick     Past Medical History:  Diagnosis Date   Arthritis 2016   R wrist predominantly radius scaphoid and midcarpal [name]   History  of migraine remote   History of shingles    HLD (hyperlipidemia)    Hypertension    Macular degeneration    [name]   Multiple myeloma (HCC)    MVP (mitral valve prolapse)    Palpitations    Postherpetic neuralgia 12/27/2012    Ulcerative colitis ([place])    left sided - proctosigmoiditis ([name])     Past Surgical History:  Procedure Laterality Date   BREAST BIOPSY  1975   COLONOSCOPY  2011   [facility]   TRACHEOSTOMY  1942   due to Strep Throat (?epiglottitis)    Social History   Socioeconomic History   Marital status: Married    Spouse name: Not on file   Number of children: Not on file   Years of education: Not on file   Highest education level: Not on file  Occupational History   Not on file  Tobacco Use   Smoking status: Never   Smokeless tobacco: Never  Vaping Use   Vaping Use: Never used  Substance and Sexual Activity   Alcohol use: No   Drug use: No   Sexual activity: Never  Other Topics Concern   Not on file  Social History Narrative   Lives with husband [name])   [place] child (daughter)   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[name]   Activity: walks and active in yard, grows vegetables   Diet: good water, good fruits/vegetables daily   Social Determinants of Radio broadcast assistant Strain: Low Risk    Difficulty of Paying Living Expenses: Not hard at all  Food Insecurity: No Food Insecurity   Worried About Charity fundraiser in the Last Year: Never true   Arboriculturist in the Last Year: Never true  Transportation Needs: No Transportation Needs   Lack of Transportation (Medical): No   Lack of Transportation (Non-Medical): No  Physical Activity: Inactive   Days of Exercise per Week: 0 days   Minutes of Exercise per Session: 0 min  Stress: No Stress Concern Present   Feeling of Stress : Not at all  Social Connections: Not on file  Intimate Partner Violence: Not At Risk   Fear of Current or Ex-Partner: No   Emotionally Abused: No   Physically Abused: No   Sexually Abused: No    Family History  Problem Relation Age of Onset   Hypertension Mother    Stroke Mother    Heart disease Mother    Rheum arthritis Father    Melanoma Father    Melanoma Brother     Lung cancer Paternal Grandfather    Colon cancer Neg Hx    Esophageal cancer Neg Hx    Pancreatic cancer Neg Hx    Stomach cancer Neg Hx      Current Outpatient Medications:    aspirin EC 81 MG tablet, Take 1 tablet (81 mg total) by mouth daily. Swallow whole., Disp: 30 tablet, Rfl: 11   Cholecalciferol (VITAMIN D3) 25 MCG (1000 UT) CAPS, Take 1 capsule (1,000 Units total) by mouth daily., Disp: 30 capsule, Rfl:    dexamethasone (DECADRON) 4 MG tablet, Take 4 tablets (16 mg total) by mouth once a week., Disp: 16 tablet, Rfl: 2   diltiazem (CARDIZEM CD) 180 MG 24 hr capsule, TAKE 1 CAPSULE BY MOUTH EVERY DAY, Disp: 90 capsule, Rfl: 3   ezetimibe (ZETIA) 10 MG tablet, TAKE ONE TABLET BY MOUTH DAILY, Disp: 90 tablet, Rfl: 3   folic acid (FOLVITE)  1 MG tablet, Take 1 tablet (1 mg total) by mouth daily., Disp: , Rfl:    polyethylene glycol (MIRALAX / GLYCOLAX) 17 g packet, Take 17 g by mouth daily., Disp: , Rfl:    propranolol (INDERAL) 40 MG tablet, TAKE 1 TABLET BY MOUTH TWICE A DAY, Disp: 180 tablet, Rfl: 1   sulfaSALAzine (AZULFIDINE) 500 MG tablet, TAKE 2 TABLETS BY MOUTH TWICE A DAY. *PLEASE KEEP SCHEDULED APPOINTMENT FOR FURTHER REFILLS.* (Patient taking differently: TAKE 2 TABLETS BY MOUTH TWICE A DAY. *PLEASE KEEP SCHEDULED APPOINTMENT FOR FURTHER REFILLS.* pt states taking 2 in morning and one in the evening.), Disp: 120 tablet, Rfl: 4   Wheat Dextrin (BENEFIBER PO), Take 1 Dose by mouth as needed. 1 tablespoon daily, Disp: , Rfl:    HYDROcodone-acetaminophen (NORCO) 5-325 MG tablet, Take 1 tablet by mouth every 6 (six) hours as needed for moderate pain., Disp: 30 tablet, Rfl: 0   lenalidomide (REVLIMID) 2.5 MG capsule, Take 1 capsule (2.5 mg total) by mouth daily. Take for 14 days, then hold for 14 days. Repeat every 28 days.  REMS# [id number] on 07/06/2021, Disp: 14 capsule, Rfl: 0   ondansetron (ZOFRAN) 4 MG tablet, Take 1 tablet (4 mg total) by mouth every 8 (eight) hours as needed for  nausea or vomiting., Disp: 20 tablet, Rfl: 0  Physical exam:  Vitals:   07/06/21 1406  BP: (!) 114/52  Pulse: 70  Resp: 20  Temp: (!) 96.2 F (35.7 C)  SpO2: 100%   Physical Exam Constitutional:      General: She is not in acute distress.    Comments: Frail elderly woman sitting in a wheelchair  Cardiovascular:     Rate and Rhythm: Normal rate and regular rhythm.     Heart sounds: Normal heart sounds.  Pulmonary:     Effort: Pulmonary effort is normal.     Breath sounds: Normal breath sounds.  Abdominal:     General: Bowel sounds are normal.     Palpations: Abdomen is soft.  Skin:    General: Skin is warm and dry.  Neurological:     Mental Status: She is alert and oriented to person, place, and time.     CMP Latest Ref Rng & Units 07/06/2021  Glucose 70 - 99 mg/dL 194(H)  BUN 8 - 23 mg/dL 44(H)  Creatinine 0.44 - 1.00 mg/dL 1.80(H)  Sodium 135 - 145 mmol/L 134(L)  Potassium 3.5 - 5.1 mmol/L 3.9  Chloride 98 - 111 mmol/L 100  CO2 22 - 32 mmol/L 23  Calcium 8.9 - 10.3 mg/dL 8.2(L)  Total Protein 6.5 - 8.1 g/dL 4.9(L)  Total Bilirubin 0.3 - 1.2 mg/dL 0.1(L)  Alkaline Phos 38 - 126 U/L 76  AST 15 - 41 U/L 24  ALT 0 - 44 U/L 25   CBC Latest Ref Rng & Units 07/06/2021  WBC 4.0 - 10.5 K/uL 19.3(H)  Hemoglobin 12.0 - 15.0 g/dL 7.9(L)  Hematocrit 36.0 - 46.0 % 24.6(L)  Platelets 150 - 400 K/uL 426(H)      Assessment and plan- Patient is a 88 y.o. female with history of kappa light chain multiple myeloma here for routine follow-up  Patient's hemoglobin prior to starting Revlimid was between 10.5-11.5.  After starting Revlimid her hemoglobin started drifting down and despite reducing the dose of Revlimid today her hemoglobin is 7.9 with an MCV of 100.4.  I have therefore asked her to hold off on taking the Revlimid to see if her hemoglobin improves.  I will be checking CBC, BMP ferritin and iron studies B12 and folate in 2 weeks and see her back in 4 weeks with myeloma  panel and serum free light chains.    Patient does report worsening back pain over the last couple of weeks.  She has refused bisphosphonates and palliative radiation in the past.  She is hesitant to try narcotics.  We discussed repeating a PET scan at this time to see if there is any worsening of her bone lesions or compression fractures of her spine that would explain the pain.  Patient is agreeable to that.  However will need to go to [facility] to get a PET scan  Leukocytosis: Likely secondary to steroids.  She is currently on Decadron 16 mg once a week and I am concerned about long-term steroids as well given potential side effect such as myopathy and leg swelling.  I have therefore asked her to take it either once a month or hold off until I see her next month before deciding further plan of care.  Patient verbalized understanding   Visit Diagnosis 1. Multiple myeloma not having achieved remission ([place])      Dr. Randa Evens, MD, MPH [facility] at [facility] [phone] 07/10/2021 9:39 AM

## 2021-07-10 NOTE — Telephone Encounter (Signed)
Spoke with patient and she informed me her daughter called "all the pharmacies in [place]" and no one had the medication. I was able to find the medication in stock at [facility], who can deliver the medication to the patient this afternoon. ? ? ?[facility] - [place], [place] - [address] ?[address] ?[place] [address] ?Phone: [phone] Fax: [phone] ? ? ?

## 2021-07-13 ENCOUNTER — Other Ambulatory Visit: Payer: Self-pay

## 2021-07-13 ENCOUNTER — Inpatient Hospital Stay: Payer: Medicare Other

## 2021-07-13 ENCOUNTER — Other Ambulatory Visit: Payer: Self-pay | Admitting: *Deleted

## 2021-07-13 ENCOUNTER — Inpatient Hospital Stay (HOSPITAL_BASED_OUTPATIENT_CLINIC_OR_DEPARTMENT_OTHER): Payer: Medicare Other | Admitting: Hospice and Palliative Medicine

## 2021-07-13 VITALS — BP 99/50 | HR 73 | Temp 99.4°F | Resp 16

## 2021-07-13 DIAGNOSIS — C9000 Multiple myeloma not having achieved remission: Secondary | ICD-10-CM | POA: Diagnosis not present

## 2021-07-13 DIAGNOSIS — G893 Neoplasm related pain (acute) (chronic): Secondary | ICD-10-CM

## 2021-07-13 DIAGNOSIS — R5383 Other fatigue: Secondary | ICD-10-CM | POA: Diagnosis not present

## 2021-07-13 DIAGNOSIS — R531 Weakness: Secondary | ICD-10-CM | POA: Diagnosis not present

## 2021-07-13 LAB — COMPREHENSIVE METABOLIC PANEL
ALT: 18 U/L (ref 0–44)
AST: 17 U/L (ref 15–41)
Albumin: 2.4 g/dL — ABNORMAL LOW (ref 3.5–5.0)
Alkaline Phosphatase: 84 U/L (ref 38–126)
Anion gap: 10 (ref 5–15)
BUN: 31 mg/dL — ABNORMAL HIGH (ref 8–23)
CO2: 22 mmol/L (ref 22–32)
Calcium: 8.1 mg/dL — ABNORMAL LOW (ref 8.9–10.3)
Chloride: 99 mmol/L (ref 98–111)
Creatinine, Ser: 1.63 mg/dL — ABNORMAL HIGH (ref 0.44–1.00)
GFR, Estimated: 31 mL/min — ABNORMAL LOW (ref >60)
Glucose, Bld: 145 mg/dL — ABNORMAL HIGH (ref 70–99)
Potassium: 4.4 mmol/L (ref 3.5–5.1)
Sodium: 131 mmol/L — ABNORMAL LOW (ref 135–145)
Total Bilirubin: <0.1 mg/dL — ABNORMAL LOW (ref 0.3–1.2)
Total Protein: 5.3 g/dL — ABNORMAL LOW (ref 6.5–8.1)

## 2021-07-13 LAB — CBC WITH DIFFERENTIAL/PLATELET
Abs Immature Granulocytes: 0.25 10*3/uL — ABNORMAL HIGH (ref 0.00–0.07)
Basophils Absolute: 0.1 10*3/uL (ref 0.0–0.1)
Basophils Relative: 1 %
Eosinophils Absolute: 0.1 10*3/uL (ref 0.0–0.5)
Eosinophils Relative: 1 %
HCT: 26.7 % — ABNORMAL LOW (ref 36.0–46.0)
Hemoglobin: 8.5 g/dL — ABNORMAL LOW (ref 12.0–15.0)
Immature Granulocytes: 1 %
Lymphocytes Relative: 8 %
Lymphs Abs: 1.6 10*3/uL (ref 0.7–4.0)
MCH: 32.3 pg (ref 26.0–34.0)
MCHC: 31.8 g/dL (ref 30.0–36.0)
MCV: 101.5 fL — ABNORMAL HIGH (ref 80.0–100.0)
Monocytes Absolute: 1.3 10*3/uL — ABNORMAL HIGH (ref 0.1–1.0)
Monocytes Relative: 6 %
Neutro Abs: 16.9 10*3/uL — ABNORMAL HIGH (ref 1.7–7.7)
Neutrophils Relative %: 83 %
Platelets: 352 10*3/uL (ref 150–400)
RBC: 2.63 MIL/uL — ABNORMAL LOW (ref 3.87–5.11)
RDW: 18 % — ABNORMAL HIGH (ref 11.5–15.5)
WBC: 20.2 10*3/uL — ABNORMAL HIGH (ref 4.0–10.5)
nRBC: 0 % (ref 0.0–0.2)

## 2021-07-13 LAB — MULTIPLE MYELOMA PANEL, SERUM
Albumin SerPl Elph-Mcnc: 2.9 g/dL (ref 2.9–4.4)
Albumin/Glob SerPl: 1.2 (ref 0.7–1.7)
Alpha 1: 0.4 g/dL (ref 0.0–0.4)
Alpha2 Glob SerPl Elph-Mcnc: 0.8 g/dL (ref 0.4–1.0)
B-Globulin SerPl Elph-Mcnc: 0.9 g/dL (ref 0.7–1.3)
Gamma Glob SerPl Elph-Mcnc: 0.4 g/dL (ref 0.4–1.8)
Globulin, Total: 2.5 g/dL (ref 2.2–3.9)
IgA: 166 mg/dL (ref 64–422)
IgG (Immunoglobin G), Serum: 456 mg/dL — ABNORMAL LOW (ref 586–1602)
IgM (Immunoglobulin M), Srm: 20 mg/dL — ABNORMAL LOW (ref 26–217)
Total Protein ELP: 5.4 g/dL — ABNORMAL LOW (ref 6.0–8.5)

## 2021-07-13 LAB — SAMPLE TO BLOOD BANK

## 2021-07-13 MED ORDER — DEXAMETHASONE 2 MG PO TABS: ORAL_TABLET | ORAL | 0 refills | Status: DC

## 2021-07-13 MED ORDER — SODIUM CHLORIDE 0.9 % IV SOLN
INTRAVENOUS | Status: DC
  Filled 2021-07-13 (×2): qty 250

## 2021-07-13 MED ORDER — DEXAMETHASONE 4 MG PO TABS: 16.0000 mg | ORAL_TABLET | ORAL | 2 refills | Status: DC

## 2021-07-13 MED ORDER — DEXAMETHASONE 4 MG PO TABS: 4.0000 mg | ORAL_TABLET | ORAL | 0 refills | Status: DC

## 2021-07-13 NOTE — Progress Notes (Signed)
Pt presents to [facility] this afternoon with concerns of weakness, fatigue, and poor appetite. Per daughter, pt is barely able to stand, and requires max assistance from family for all transfers. She is also interested in home health. States that her pain is better controlled than it was previously.  ?

## 2021-07-13 NOTE — Progress Notes (Signed)
Symptom Management [place] at [facility] [phone] [phone]  Patient Care Team: [name], MD as PCP - General (Family Medicine) Troy Sine, MD as PCP - Cardiology (Cardiology) [name], MD as Consulting Physician (Hematology and Oncology) [name], [name], MD as Consulting Physician (Gastroenterology) [name], [facility] as Pharmacist (Pharmacist)   Name of the patient: Diana Brewer  [id number]  11-05-1933   Date of visit: 07/13/21  Reason for Consult: MARVETTE SCHAMP is a 88 y.o. female with multiple medical problems including multiple myeloma. She has history of back pain with previous endplate fracture of T9, T5, T7, and T11. Patient has been previously seen by neurosurgery with recommendation for medical management.   Patient started on Revlimid in August 2022.    Patient last saw Dr. Janese Banks on 05/27/21 at which time back pain had improved after starting on treatment for myeloma.   Telephone visit on 3/2 with family given intractable back pain.  Started patient on Norco.  Today, patient follows up with [facility].  She reports improved pain but marked weakness.  She has difficulty standing without assistance.  She has had poor oral intake.  She has had intermittent nausea and diarrhea.  No fever or chills.  No abdominal pain or distention.  Denies any neurologic complaints. Denies recent fevers or illnesses. Denies any easy bleeding or bruising. Reports poor appetite and denies weight loss. Denies chest pain. Denies urinary complaints. Patient offers no further specific complaints today.  PAST MEDICAL HISTORY: Past Medical History:  Diagnosis Date   Arthritis 2016   R wrist predominantly radius scaphoid and midcarpal [name]   History of migraine remote   History of shingles    HLD (hyperlipidemia)    Hypertension    Macular degeneration    [name]   Multiple myeloma (HCC)    MVP (mitral  valve prolapse)    Palpitations    Postherpetic neuralgia 12/27/2012   Ulcerative colitis ([place])    left sided - proctosigmoiditis ([name])    PAST SURGICAL HISTORY:  Past Surgical History:  Procedure Laterality Date   BREAST BIOPSY  1975   COLONOSCOPY  2011   [facility]   TRACHEOSTOMY  1942   due to Strep Throat (?epiglottitis)    HEMATOLOGY/ONCOLOGY HISTORY:  Oncology History  Multiple myeloma not having achieved remission ([place])  12/07/2020 Initial Diagnosis   Multiple myeloma not having achieved remission ([place])   12/22/2020 Cancer Staging   Staging form: Plasma Cell Myeloma and Plasma Cell Disorders, AJCC 8th Edition - Clinical stage from 12/22/2020: Beta-2-microglobulin (mg/L): 5.8, Albumin (g/dL): 1.4, ISS: Stage III, High-risk cytogenetics: Unknown, LDH: Normal - Signed by [name], MD on 12/27/2020 Beta 2 microglobulin range (mg/L): Greater than or equal to 5.5 Albumin range (g/dL): Less than 3.5 Cytogenetics: Unknown Serum calcium level: Elevated Pre-operative calcium level (mg/dL): 10.8 Serum creatinine level: Elevated Bone disease on imaging: Present      ALLERGIES:  is allergic to tramadol, codeine, cortisone, and guaifenesin er.  MEDICATIONS:  Current Outpatient Medications  Medication Sig Dispense Refill   aspirin EC 81 MG tablet Take 1 tablet (81 mg total) by mouth daily. Swallow whole. 30 tablet 11   Cholecalciferol (VITAMIN D3) 25 MCG (1000 UT) CAPS Take 1 capsule (1,000 Units total) by mouth daily. 30 capsule    dexamethasone (DECADRON) 4 MG tablet Take 4 tablets (16 mg total) by mouth once a week. 16 tablet 2   diltiazem (CARDIZEM CD) 180  MG 24 hr capsule TAKE 1 CAPSULE BY MOUTH EVERY DAY 90 capsule 3   ezetimibe (ZETIA) 10 MG tablet TAKE ONE TABLET BY MOUTH DAILY 90 tablet 3   folic acid (FOLVITE) 1 MG tablet Take 1 tablet (1 mg total) by mouth daily.     HYDROcodone-acetaminophen (NORCO) 5-325 MG tablet Take 1 tablet by mouth every 6 (six) hours as  needed for moderate pain. 30 tablet 0   lenalidomide (REVLIMID) 2.5 MG capsule Take 1 capsule (2.5 mg total) by mouth daily. Take for 14 days, then hold for 14 days. Repeat every 28 days.  REMS# [id number] on 07/06/2021 14 capsule 0   ondansetron (ZOFRAN) 4 MG tablet Take 1 tablet (4 mg total) by mouth every 8 (eight) hours as needed for nausea or vomiting. 20 tablet 0   polyethylene glycol (MIRALAX / GLYCOLAX) 17 g packet Take 17 g by mouth daily.     propranolol (INDERAL) 40 MG tablet TAKE 1 TABLET BY MOUTH TWICE A DAY 180 tablet 1   sulfaSALAzine (AZULFIDINE) 500 MG tablet TAKE 2 TABLETS BY MOUTH TWICE A DAY. *PLEASE KEEP SCHEDULED APPOINTMENT FOR FURTHER REFILLS.* (Patient taking differently: TAKE 2 TABLETS BY MOUTH TWICE A DAY. *PLEASE KEEP SCHEDULED APPOINTMENT FOR FURTHER REFILLS.* pt states taking 2 in morning and one in the evening.) 120 tablet 4   Wheat Dextrin (BENEFIBER PO) Take 1 Dose by mouth as needed. 1 tablespoon daily     No current facility-administered medications for this visit.    VITAL SIGNS: There were no vitals taken for this visit. There were no vitals filed for this visit.  Estimated body mass index is 24.61 kg/m as calculated from the following:   Height as of 05/27/21: 5' (1.524 m).   Weight as of 05/27/21: 126 lb (57.2 kg).  LABS: CBC:    Component Value Date/Time   WBC 19.3 (H) 07/06/2021 1330   HGB 7.9 (L) 07/06/2021 1330   HGB 9.1 (L) 02/27/2021 1404   HGB 10.6 (L) 06/24/2020 0914   HCT 24.6 (L) 07/06/2021 1330   HCT 30.1 (L) 06/24/2020 0914   PLT 426 (H) 07/06/2021 1330   PLT 194 02/27/2021 1404   PLT 467 (H) 06/24/2020 0914   MCV 100.4 (H) 07/06/2021 1330   MCV 98 (H) 06/24/2020 0914   NEUTROABS 15.9 (H) 07/06/2021 1330   NEUTROABS 4.4 10/01/2019 1140   LYMPHSABS 0.8 07/06/2021 1330   LYMPHSABS 1.5 10/01/2019 1140   MONOABS 2.3 (H) 07/06/2021 1330   EOSABS 0.0 07/06/2021 1330   EOSABS 0.3 10/01/2019 1140   BASOSABS 0.1 07/06/2021 1330   BASOSABS  0.1 10/01/2019 1140   Comprehensive Metabolic Panel:    Component Value Date/Time   NA 134 (L) 07/06/2021 1330   NA 139 07/01/2020 0940   K 3.9 07/06/2021 1330   CL 100 07/06/2021 1330   CO2 23 07/06/2021 1330   BUN 44 (H) 07/06/2021 1330   BUN 23 07/01/2020 0940   CREATININE 1.80 (H) 07/06/2021 1330   CREATININE 1.27 (H) 02/27/2021 1404   CREATININE 1.24 (H) 03/10/2016 0814   GLUCOSE 194 (H) 07/06/2021 1330   CALCIUM 8.2 (L) 07/06/2021 1330   AST 24 07/06/2021 1330   AST 40 02/27/2021 1404   ALT 25 07/06/2021 1330   ALT 87 (H) 02/27/2021 1404   ALKPHOS 76 07/06/2021 1330   BILITOT 0.1 (L) 07/06/2021 1330   BILITOT 0.7 02/27/2021 1404   PROT 4.9 (L) 07/06/2021 1330   PROT 6.7 06/24/2020 0914  ALBUMIN 2.7 (L) 07/06/2021 1330   ALBUMIN 4.6 06/24/2020 0914    RADIOGRAPHIC STUDIES: No results found.  PERFORMANCE STATUS (ECOG) : 3 - Symptomatic, >50% confined to bed  Review of Systems Unless otherwise noted, a complete review of systems is negative.  Physical Exam General: NAD Cardiovascular: regular rate and rhythm Pulmonary: clear anterior/posterior fields Abdomen: soft, nontender, + bowel sounds GU: no suprapubic tenderness Extremities: no edema, no joint deformities Skin: no rashes Neurological: Weakness but otherwise nonfocal  Assessment and Plan- Patient is a 88 y.o. female  including multiple myeloma. She has history of back pain with previous endplate fracture of T9, T5, T7, and T11. Patient has been previously seen by neurosurgery with recommendation for medical management.    Weakness-labs grossly unchanged from baseline.  Suspect residual effects from cancer treatment and exacerbated by poor oral intake.  We will proceed with IV fluids today.  We will start on low-dose dexamethasone taper for appetite/fatigue.  Referral for home health PT/OT.  Discussed with Dr. Janese Banks and will proceed with PET scan for restaging.  Discussed follow-up with patient.  She was  reluctant to come back for fluids later this week.  She will call if she feels like she needs follow-up.  Otherwise, patient to return as previously scheduled.  Case and plan discussed with Dr. Janese Banks   Patient expressed understanding and was in agreement with this plan. She also understands that She can call clinic at any time with any questions, concerns, or complaints.   Thank you for allowing me to participate in the care of this very pleasant patient.   Time Total: 25 minutes  Visit consisted of counseling and education dealing with the complex and emotionally intense issues of symptom management in the setting of serious illness.Greater than 50%  of this time was spent counseling and coordinating care related to the above assessment and plan.  Signed by: Altha Harm, PhD, NP-C

## 2021-07-14 ENCOUNTER — Telehealth: Payer: Self-pay | Admitting: *Deleted

## 2021-07-14 NOTE — Telephone Encounter (Signed)
I had refilled her steroids since she was going to get it once this week to see if she feels better and may be helped with her fatigue and pain. Diana Brewer has seen the pt for [facility] and he sent rx for 4 mg daily x 1 week and then 2 mg next week daily. The [facility] did not have 2 mg tablets. So I called [facility] and spoke to [employer] and told them that I will send in anew rx that is s 4 mg tablet daily for 7 days and then cut tablet in half  week 2 and take 1/2 tablet daily for 1 week and then done. [facility] has 58m tablets and they can fill the order. I also spoke to daughter and she will get her rx today and take it to her mother ?

## 2021-07-16 ENCOUNTER — Telehealth: Payer: Self-pay | Admitting: *Deleted

## 2021-07-16 NOTE — Telephone Encounter (Signed)
ok 

## 2021-07-16 NOTE — Telephone Encounter (Signed)
Call returned to [facility] and left message on confidential voice mail for order approval as requested ?

## 2021-07-16 NOTE — Telephone Encounter (Signed)
Incoming call from [place] with [place] asking for order approval for continuation of PT 2 wk 1, 1 wk 4 and also to add a home health aid ?

## 2021-07-17 ENCOUNTER — Telehealth: Payer: Self-pay | Admitting: *Deleted

## 2021-07-17 DIAGNOSIS — C9000 Multiple myeloma not having achieved remission: Secondary | ICD-10-CM

## 2021-07-17 DIAGNOSIS — R197 Diarrhea, unspecified: Secondary | ICD-10-CM

## 2021-07-17 NOTE — Telephone Encounter (Signed)
She had 4-5 diarrhea stools over 2 days. I spoke to her and told her that she can come in and see symptom management. She states that she is weak and does not feel like coming in. I told her about BRAT diet, and to take imodium for diarrhea. Also I can make a packet for her if someone will come and get it. It will have a cup for stool sample for c diff and a container with orange lid for stool pathogens. She said that she will get someone to pick it up. Same day the stool is collected needs to be back on same day. I wrote instructions for it. She has a lab appt for 3/13 and she thinks she may feel better then to come in and did offer her if she needs it symptom management but I need to know to set it up. She wants to wait til Monday and if she needs it she will call me in am. ?

## 2021-07-17 NOTE — Telephone Encounter (Signed)
Pt called and said that she was having diarrhea, she took some peptobismol ?

## 2021-07-20 ENCOUNTER — Other Ambulatory Visit: Payer: Self-pay | Admitting: Hospice and Palliative Medicine

## 2021-07-20 ENCOUNTER — Other Ambulatory Visit: Payer: Self-pay

## 2021-07-20 ENCOUNTER — Inpatient Hospital Stay: Payer: Medicare Other

## 2021-07-20 ENCOUNTER — Telehealth: Payer: Self-pay | Admitting: *Deleted

## 2021-07-20 ENCOUNTER — Other Ambulatory Visit: Payer: Self-pay | Admitting: *Deleted

## 2021-07-20 ENCOUNTER — Other Ambulatory Visit: Payer: Self-pay | Admitting: Internal Medicine

## 2021-07-20 DIAGNOSIS — A09 Infectious gastroenteritis and colitis, unspecified: Secondary | ICD-10-CM

## 2021-07-20 DIAGNOSIS — C9000 Multiple myeloma not having achieved remission: Secondary | ICD-10-CM | POA: Diagnosis not present

## 2021-07-20 DIAGNOSIS — R197 Diarrhea, unspecified: Secondary | ICD-10-CM

## 2021-07-20 LAB — BASIC METABOLIC PANEL
Anion gap: 9 (ref 5–15)
BUN: 41 mg/dL — ABNORMAL HIGH (ref 8–23)
CO2: 23 mmol/L (ref 22–32)
Calcium: 8.4 mg/dL — ABNORMAL LOW (ref 8.9–10.3)
Chloride: 104 mmol/L (ref 98–111)
Creatinine, Ser: 1.72 mg/dL — ABNORMAL HIGH (ref 0.44–1.00)
GFR, Estimated: 29 mL/min — ABNORMAL LOW (ref >60)
Glucose, Bld: 301 mg/dL — ABNORMAL HIGH (ref 70–99)
Potassium: 4.7 mmol/L (ref 3.5–5.1)
Sodium: 136 mmol/L (ref 135–145)

## 2021-07-20 LAB — CBC WITH DIFFERENTIAL/PLATELET
Abs Immature Granulocytes: 0.25 10*3/uL — ABNORMAL HIGH (ref 0.00–0.07)
Basophils Absolute: 0 10*3/uL (ref 0.0–0.1)
Basophils Relative: 0 %
Eosinophils Absolute: 0 10*3/uL (ref 0.0–0.5)
Eosinophils Relative: 0 %
HCT: 27.8 % — ABNORMAL LOW (ref 36.0–46.0)
Hemoglobin: 8.9 g/dL — ABNORMAL LOW (ref 12.0–15.0)
Immature Granulocytes: 1 %
Lymphocytes Relative: 2 %
Lymphs Abs: 0.4 10*3/uL — ABNORMAL LOW (ref 0.7–4.0)
MCH: 33 pg (ref 26.0–34.0)
MCHC: 32 g/dL (ref 30.0–36.0)
MCV: 103 fL — ABNORMAL HIGH (ref 80.0–100.0)
Monocytes Absolute: 0.5 10*3/uL (ref 0.1–1.0)
Monocytes Relative: 3 %
Neutro Abs: 19.8 10*3/uL — ABNORMAL HIGH (ref 1.7–7.7)
Neutrophils Relative %: 94 %
Platelets: 410 10*3/uL — ABNORMAL HIGH (ref 150–400)
RBC: 2.7 MIL/uL — ABNORMAL LOW (ref 3.87–5.11)
RDW: 19.8 % — ABNORMAL HIGH (ref 11.5–15.5)
WBC: 21 10*3/uL — ABNORMAL HIGH (ref 4.0–10.5)
nRBC: 0 % (ref 0.0–0.2)

## 2021-07-20 LAB — FERRITIN: Ferritin: 95 ng/mL (ref 11–307)

## 2021-07-20 LAB — C DIFFICILE QUICK SCREEN W PCR REFLEX
C Diff antigen: NEGATIVE
C Diff interpretation: NOT DETECTED
C Diff toxin: NEGATIVE

## 2021-07-20 LAB — IRON AND TIBC
Iron: 48 ug/dL (ref 28–170)
Saturation Ratios: 22 % (ref 10.4–31.8)
TIBC: 218 ug/dL — ABNORMAL LOW (ref 250–450)
UIBC: 170 ug/dL

## 2021-07-20 LAB — VITAMIN B12: Vitamin B-12: 727 pg/mL (ref 180–914)

## 2021-07-20 MED ORDER — FLUCONAZOLE 100 MG PO TABS: 100.0000 mg | ORAL_TABLET | Freq: Every day | ORAL | 0 refills | Status: DC

## 2021-07-20 MED ORDER — FLUCONAZOLE 100 MG PO TABS
100.0000 mg | ORAL_TABLET | Freq: Every day | ORAL | 0 refills | Status: DC
Start: 2021-07-20 — End: 2021-07-20

## 2021-07-20 NOTE — Progress Notes (Signed)
New rx

## 2021-07-22 LAB — GI PATHOGEN PANEL BY PCR, STOOL

## 2021-07-31 ENCOUNTER — Telehealth: Payer: Self-pay | Admitting: Family Medicine

## 2021-07-31 ENCOUNTER — Telehealth: Payer: Self-pay

## 2021-07-31 NOTE — Telephone Encounter (Signed)
Spoke with PCP's office requesting Dr Danise Mina, to call Dr Elroy Channel cell to discuss care for mutual patient as requested by Dr.Rao ?

## 2021-07-31 NOTE — Telephone Encounter (Signed)
Called back, went to voicemail. Will try again later.  ?

## 2021-07-31 NOTE — Telephone Encounter (Signed)
Spoke with Dr Janese Banks.  ?Anemia on low dose Revlimid. Off Revlimid for the past month, Hgb 7.9-->8.9.  ?FLC ratio is improved.  ?Pt feels poorly overall.  ?Rec further imaging as next step but [facility] PET machine is down - scan pending until 09/2021. Pt declines going to [place], declines MRI.  ? ?Plz call pt - I last saw 09/2020, would like her to come see me over the next several weeks to touch base on her condition overall, see how else we can help.  ?

## 2021-07-31 NOTE — Telephone Encounter (Signed)
[facility] called stated Dr Janese Banks would like a call back has questions and concern regarding pt # [phone] ?

## 2021-08-03 ENCOUNTER — Telehealth: Payer: Self-pay

## 2021-08-03 ENCOUNTER — Telehealth: Payer: Self-pay | Admitting: *Deleted

## 2021-08-03 NOTE — Telephone Encounter (Signed)
Yes ok 

## 2021-08-03 NOTE — Telephone Encounter (Signed)
Spoke with patient's daughter Constance Holster and scheduled a telephonic Palliative Consult for 08/04/21 @ 1 PM.  ? ?Consent obtained; updated Outlook/Netsmart/Team List and Epic.  ? ?

## 2021-08-03 NOTE — Telephone Encounter (Signed)
Earlier today the daughter called to see if anyone as reached out to [place] about getting a  bed for the pt. She is weak and neither the daughter or the husband can't get her up from wheel chair to bathroom. Iasked the daughter who did she call and she thought it was [facility]. I called them and they said they have not got a ref. I ten spoke to the pt. And asked her how she is doing. She is doing awful, she says that her feet are swollen, cshe goes from diarrhea to constipation, she feels like she has glaze over her eyes. The pt. Does not thing that the revlimid she was on did not do anything for her. I told  her from lab standpoint the numbers were getting better, but from physical ly for pt it made her feel worse and  appetite while she was on med all food tasted bad. She does not want to take any more of that medicine. She states that she has BSC, Walker, cane, wheelchair. She states that neither of her family numbers have the strength to help her to get her on commode. She can't use adult diapers because they can't get them on. When she has diarrhea it makes a mess everywhere. I told her that I can send ref. To hospice and Dr. Janese Banks will still be the doctor and they will come and see if she qualifies for hospice or a semi electric bed. The bed is the only thing pt wants. I also told her that if she gets hospice and ever wanted to get treatment in the future we would have to cancel the hospice. She understands but does not want any more treatment. ? ?

## 2021-08-03 NOTE — Telephone Encounter (Signed)
Referral received and Hospice is asking per family request if Dr Janese Banks will serve as attending  ?

## 2021-08-04 ENCOUNTER — Other Ambulatory Visit: Payer: Self-pay | Admitting: Hospice

## 2021-08-04 ENCOUNTER — Ambulatory Visit: Payer: Medicare Other | Admitting: Oncology

## 2021-08-04 ENCOUNTER — Other Ambulatory Visit: Payer: Medicare Other

## 2021-08-04 ENCOUNTER — Other Ambulatory Visit: Payer: Self-pay

## 2021-08-04 NOTE — Telephone Encounter (Signed)
Spoke to patient and her daughter Constance Holster and was advised that she is not able to come in. Constance Holster stated that her mom is pretty much bedridden and they are waiting on a bed from Hospice. Patient's daughter stated that they have requested Hospice care and there is no way that her mom is up to coming to the office to be seen. Constance Holster stated that she thought that her cancer doctor would be taking care of her mom. ?

## 2021-08-06 ENCOUNTER — Telehealth: Payer: Self-pay | Admitting: *Deleted

## 2021-08-06 ENCOUNTER — Other Ambulatory Visit: Payer: Self-pay | Admitting: Hospice and Palliative Medicine

## 2021-08-06 MED ORDER — FUROSEMIDE 20 MG PO TABS: 10.0000 mg | ORAL_TABLET | Freq: Every day | ORAL | 0 refills | Status: AC | PRN

## 2021-08-06 MED ORDER — BISACODYL 10 MG RE SUPP: 10.0000 mg | Freq: Every day | RECTAL | 0 refills | Status: AC | PRN

## 2021-08-06 MED ORDER — DEXAMETHASONE 2 MG PO TABS: 1.0000 mg | ORAL_TABLET | Freq: Every day | ORAL | 0 refills | Status: AC

## 2021-08-06 NOTE — Telephone Encounter (Signed)
I called th husband after daughter called about medication from [place] [facility]. I called Josh about meds for the pt. And jennifer at hospice was taking care of putting in the meds. Daughter checked and no rx was sent in. I called [facility] and spoke to [place] and she says she will send a message to jennifer to see if she can call me or get meds into pharmacy and no one called back. I called the main office and then crystal called me back to see if I can put orders in and I told her I will need to call josh to see what meds are and will call her back. I called josh and he is putting them in and I will let family know. I called the husband and apologized for the meds not in pharmacy but I spoke to [place] and he is putting them in . Husband thanked me for the call. ?

## 2021-08-06 NOTE — Progress Notes (Signed)
I received a call from hospice nurse, Anderson Malta. Reportedly, patient has several concerns including BLE edema and constipation. Additionally, patient has requested refill of her dexamethasone. We dicsussed that it could be the steroid causing fluid retention. However, patient appears to feel better on the steroid and hospice would like to try a diuretic. Will decrease dose of dexamethasone to 8m daily. Okay to trial Lasix 157mdaily for a few days to see if that helps. However, I would caution patient regarding fall risk. Additionally, okay to implement bowel regimen including PR dulcolax per hospice request.  ?

## 2021-08-06 NOTE — Telephone Encounter (Signed)
Crystal from hospice has called me back and I gave her the meds that Josh had entered in for hospice. Dulcolax sup, lasix and dexamethasone. Crystal had already called the daughter to let her know. I let crystal know that I called the husband and let him know also ?

## 2021-08-07 ENCOUNTER — Telehealth: Payer: Self-pay | Admitting: Hospice and Palliative Medicine

## 2021-08-07 ENCOUNTER — Emergency Department (HOSPITAL_COMMUNITY)
Admission: EM | Admit: 2021-08-07 | Discharge: 2021-08-07 | Disposition: A | Discharge status: Home / Self Care | Payer: Medicare Other | Attending: Emergency Medicine | Admitting: Emergency Medicine

## 2021-08-07 ENCOUNTER — Encounter (HOSPITAL_COMMUNITY): Payer: Self-pay | Admitting: Emergency Medicine

## 2021-08-07 ENCOUNTER — Other Ambulatory Visit: Payer: Self-pay

## 2021-08-07 DIAGNOSIS — N390 Urinary tract infection, site not specified: Secondary | ICD-10-CM | POA: Diagnosis not present

## 2021-08-07 DIAGNOSIS — Z8579 Personal history of other malignant neoplasms of lymphoid, hematopoietic and related tissues: Secondary | ICD-10-CM | POA: Diagnosis not present

## 2021-08-07 DIAGNOSIS — R339 Retention of urine, unspecified: Secondary | ICD-10-CM | POA: Diagnosis not present

## 2021-08-07 DIAGNOSIS — Z7982 Long term (current) use of aspirin: Secondary | ICD-10-CM | POA: Diagnosis not present

## 2021-08-07 DIAGNOSIS — I10 Essential (primary) hypertension: Secondary | ICD-10-CM | POA: Insufficient documentation

## 2021-08-07 DIAGNOSIS — Z79899 Other long term (current) drug therapy: Secondary | ICD-10-CM | POA: Diagnosis not present

## 2021-08-07 DIAGNOSIS — R600 Localized edema: Secondary | ICD-10-CM | POA: Insufficient documentation

## 2021-08-07 DIAGNOSIS — R000 Tachycardia, unspecified: Secondary | ICD-10-CM | POA: Diagnosis not present

## 2021-08-07 LAB — CBC WITH DIFFERENTIAL/PLATELET
Abs Immature Granulocytes: 0.21 10*3/uL — ABNORMAL HIGH (ref 0.00–0.07)
Basophils Absolute: 0.1 10*3/uL (ref 0.0–0.1)
Basophils Relative: 0 %
Eosinophils Absolute: 0 10*3/uL (ref 0.0–0.5)
Eosinophils Relative: 0 %
HCT: 27.9 % — ABNORMAL LOW (ref 36.0–46.0)
Hemoglobin: 8.9 g/dL — ABNORMAL LOW (ref 12.0–15.0)
Immature Granulocytes: 2 %
Lymphocytes Relative: 6 %
Lymphs Abs: 0.7 10*3/uL (ref 0.7–4.0)
MCH: 32.8 pg (ref 26.0–34.0)
MCHC: 31.9 g/dL (ref 30.0–36.0)
MCV: 103 fL — ABNORMAL HIGH (ref 80.0–100.0)
Monocytes Absolute: 1 10*3/uL (ref 0.1–1.0)
Monocytes Relative: 8 %
Neutro Abs: 9.6 10*3/uL — ABNORMAL HIGH (ref 1.7–7.7)
Neutrophils Relative %: 84 %
Platelets: 166 10*3/uL (ref 150–400)
RBC: 2.71 MIL/uL — ABNORMAL LOW (ref 3.87–5.11)
RDW: 17.7 % — ABNORMAL HIGH (ref 11.5–15.5)
WBC: 11.6 10*3/uL — ABNORMAL HIGH (ref 4.0–10.5)
nRBC: 0.2 % (ref 0.0–0.2)

## 2021-08-07 LAB — URINALYSIS, ROUTINE W REFLEX MICROSCOPIC
Bilirubin Urine: NEGATIVE
Glucose, UA: NEGATIVE mg/dL
Hgb urine dipstick: NEGATIVE
Ketones, ur: NEGATIVE mg/dL
Leukocytes,Ua: NEGATIVE
Nitrite: NEGATIVE
Protein, ur: NEGATIVE mg/dL
Specific Gravity, Urine: 1.015 (ref 1.005–1.030)
pH: 5 (ref 5.0–8.0)

## 2021-08-07 LAB — BASIC METABOLIC PANEL
Anion gap: 10 (ref 5–15)
BUN: 59 mg/dL — ABNORMAL HIGH (ref 8–23)
CO2: 22 mmol/L (ref 22–32)
Calcium: 8.1 mg/dL — ABNORMAL LOW (ref 8.9–10.3)
Chloride: 104 mmol/L (ref 98–111)
Creatinine, Ser: 1.42 mg/dL — ABNORMAL HIGH (ref 0.44–1.00)
GFR, Estimated: 36 mL/min — ABNORMAL LOW (ref >60)
Glucose, Bld: 193 mg/dL — ABNORMAL HIGH (ref 70–99)
Potassium: 4.3 mmol/L (ref 3.5–5.1)
Sodium: 136 mmol/L (ref 135–145)

## 2021-08-07 MED ORDER — TAMSULOSIN HCL 0.4 MG PO CAPS: 0.4000 mg | ORAL_CAPSULE | Freq: Every day | ORAL | 0 refills | Status: AC

## 2021-08-07 NOTE — Telephone Encounter (Signed)
Received a call from hospice nurse that patient is now having urinary retention.  Verbal order given for Foley placement.  RN to go out later this afternoon. ?

## 2021-08-07 NOTE — ED Notes (Signed)
Called [facility] for transport as pt is an [facility] hospice patient.  ?

## 2021-08-07 NOTE — ED Triage Notes (Addendum)
Pt BIB EMS from home requesting a catheter be inserted. States hospice nurse was unable to insert cath at home.  ?

## 2021-08-07 NOTE — ED Provider Notes (Signed)
?[facility] DEPT ?Provider Note ? ? ?CSN: [id number] ?Arrival date & time: 08/07/21  1711 ? ?  ? ?History ? ?Chief Complaint  ?Patient presents with  ? Needs Cath  ? Urinary Retention  ? ? ?Diana Brewer is a 88 y.o. female. ? ?HPI ? ?  ? ?88 year old female with a history of multiple myeloma, history of back pain with endplate fractures, significant decrease in mobility, hypertension, hyperlipidemia, ulcerative colitis, who is being seen by for care hospice presents with concern for urinary retention.  It attempted to place a catheter with the hospice nurse, and she was unable to do so.  ? ?She reports she has been unable to urinate since 6 or 7 AM yesterday. Has felt the urge but unable to do so.  She had a loose bowel movement today (not black or bloody), but did not have any urine with it.  She has had increasing suprapubic abdominal pain since then.  Pain severe, unrelieved by her home hydrocodone. Denies vomiting, fevers.  She has had increasing leg swelling.  They recently stopped her steroids, but this was restarted.  No other new medications.  She has been taking hydrocodone for pain, but she has been taking this for some time.  Did not have preceding dysuria.  She has back pain related to known fractures, and disability for which she is determined to not be a surgical candidate.  ? ?Discussed her ED stay goals, and patient and family agree and focusing on her urinary retention today and keeping her comfortable.  She is not sure whether or not she would want admission, however if she had significant renal injury or electrolyte abnormalities related to this. She would like testing for UTI.  ? ? ? ? ?Past Medical History:  ?Diagnosis Date  ? Arthritis 2016  ? R wrist predominantly radius scaphoid and midcarpal [name]  ? History of migraine remote  ? History of shingles   ? HLD (hyperlipidemia)   ? Hypertension   ? Macular degeneration   ? [name]  ? Multiple myeloma ([place])    ? MVP (mitral valve prolapse)   ? Palpitations   ? Postherpetic neuralgia 12/27/2012  ? Ulcerative colitis ([place])   ? left sided - proctosigmoiditis ([name])  ?  ? ?Home Medications ?Prior to Admission medications   ?Medication Sig Start Date End Date Taking? Authorizing Provider  ?tamsulosin (FLOMAX) 0.4 MG CAPS capsule Take 1 capsule (0.4 mg total) by mouth daily for 14 days. 08/07/21 08/21/21 Yes Gareth Morgan, MD  ?aspirin EC 81 MG tablet Take 1 tablet (81 mg total) by mouth daily. Swallow whole. 04/25/20   Troy Sine, MD  ?bisacodyl (DULCOLAX) 10 MG suppository Place 1 suppository (10 mg total) rectally daily as needed for moderate constipation. 08/06/21   Borders, Kirt Boys, NP  ?Cholecalciferol (VITAMIN D3) 25 MCG (1000 UT) CAPS Take 1 capsule (1,000 Units total) by mouth daily. 09/12/20   [name], MD  ?dexamethasone (DECADRON) 2 MG tablet Take 0.5 tablets (1 mg total) by mouth daily. 08/06/21   Borders, Kirt Boys, NP  ?diltiazem (CARDIZEM CD) 180 MG 24 hr capsule TAKE 1 CAPSULE BY MOUTH EVERY DAY 10/17/20   Troy Sine, MD  ?ezetimibe (ZETIA) 10 MG tablet TAKE ONE TABLET BY MOUTH DAILY 05/08/21   Troy Sine, MD  ?fluconazole (DIFLUCAN) 100 MG tablet Take 1 tablet (100 mg total) by mouth daily. 07/20/21   Borders, Kirt Boys, NP  ?folic acid (FOLVITE) 1 MG  tablet Take 1 tablet (1 mg total) by mouth daily. 11/26/19   [name], MD  ?furosemide (LASIX) 20 MG tablet Take 0.5-1 tablets (10-20 mg total) by mouth daily as needed for edema. 08/06/21   Borders, Kirt Boys, NP  ?HYDROcodone-acetaminophen (NORCO) 5-325 MG tablet Take 1 tablet by mouth every 6 (six) hours as needed for moderate pain. 07/10/21   Borders, Kirt Boys, NP  ?lenalidomide (REVLIMID) 2.5 MG capsule Take 1 capsule (2.5 mg total) by mouth daily. Take for 14 days, then hold for 14 days. Repeat every 28 days.  REMS# [id number] on 07/06/2021 07/06/21   [name], MD  ?ondansetron (ZOFRAN) 4 MG tablet Take 1 tablet (4 mg  total) by mouth every 8 (eight) hours as needed for nausea or vomiting. 07/09/21   Borders, Kirt Boys, NP  ?polyethylene glycol (MIRALAX / GLYCOLAX) 17 g packet Take 17 g by mouth daily.    [provider]  ?propranolol (INDERAL) 40 MG tablet TAKE 1 TABLET BY MOUTH TWICE A DAY 01/20/21   Troy Sine, MD  ?sulfaSALAzine (AZULFIDINE) 500 MG tablet TAKE 2 TABLETS BY MOUTH TWICE A DAY. *PLEASE KEEP SCHEDULED APPOINTMENT FOR FURTHER REFILLS.* ?Patient taking differently: TAKE 2 TABLETS BY MOUTH TWICE A DAY. *PLEASE KEEP SCHEDULED APPOINTMENT FOR FURTHER REFILLS.* pt states taking 2 in morning and one in the evening. 01/20/21   [name], [name], MD  ?Wheat Dextrin (BENEFIBER PO) Take 1 Dose by mouth as needed. 1 tablespoon daily    [provider]  ?   ? ?Allergies    ?Tramadol, Codeine, Cortisone, and Guaifenesin er   ? ?Review of Systems   ?Review of Systems ? ?See above ? ?Physical Exam ?Updated Vital Signs ?BP 114/68   Pulse 94   Temp 98.1 ?F (36.7 ?C) (Oral)   Resp 16   SpO2 92%  ?Physical Exam ?Vitals and nursing note reviewed.  ?Constitutional:   ?   General: She is not in acute distress. ?   Appearance: Normal appearance. She is not ill-appearing, toxic-appearing or diaphoretic.  ?HENT:  ?   Head: Normocephalic.  ?Eyes:  ?   Conjunctiva/sclera: Conjunctivae normal.  ?Cardiovascular:  ?   Rate and Rhythm: Normal rate and regular rhythm.  ?   Pulses: Normal pulses.  ?Pulmonary:  ?   Effort: Pulmonary effort is normal. No respiratory distress.  ?Musculoskeletal:     ?   General: No deformity or signs of injury.  ?   Cervical back: No rigidity.  ?   Right lower leg: Edema present.  ?   Left lower leg: Edema (left greater than right (baseline)) present.  ?Skin: ?   General: Skin is warm and dry.  ?   Coloration: Skin is not jaundiced or pale.  ?Neurological:  ?   General: No focal deficit present.  ?   Mental Status: She is alert and oriented to person, place, and time.  ? ? ?ED Results / Procedures /  Treatments   ?Labs ?(all labs ordered are listed, but only abnormal results are displayed) ?Labs Reviewed  ?CBC WITH DIFFERENTIAL/PLATELET - Abnormal; Notable for the following components:  ?    Result Value  ? WBC 11.6 (*)   ? RBC 2.71 (*)   ? Hemoglobin 8.9 (*)   ? HCT 27.9 (*)   ? MCV 103.0 (*)   ? RDW 17.7 (*)   ? Neutro Abs 9.6 (*)   ? Abs Immature Granulocytes 0.21 (*)   ? All other  components within normal limits  ?BASIC METABOLIC PANEL - Abnormal; Notable for the following components:  ? Glucose, Bld 193 (*)   ? BUN 59 (*)   ? Creatinine, Ser 1.42 (*)   ? Calcium 8.1 (*)   ? GFR, Estimated 36 (*)   ? All other components within normal limits  ?URINALYSIS, ROUTINE W REFLEX MICROSCOPIC - Abnormal; Notable for the following components:  ? APPearance HAZY (*)   ? All other components within normal limits  ?URINE CULTURE  ? ? ?EKG ?None ? ?Radiology ?No results found. ? ?Procedures ?Procedures  ? ? ?Medications Ordered in ED ?Medications - No data to display ? ?ED Course/ Medical Decision Making/ A&P ?  ?                        ?Medical Decision Making ?Amount and/or Complexity of Data Reviewed ?Labs: ordered. ? ?Risk ?Prescription drug management. ? ? ? ?88 year old female with a history of multiple myeloma, history of back pain with endplate fractures, significant decrease in mobility, hypertension, hyperlipidemia, ulcerative colitis, who is being seen by for care hospice presents with concern for urinary retention, failed catheter placement by hospice nurse.  ? ?Catheter placement was attempted by 2 different emergency department RNs, and they were able to successfully place. She feel significantly improved following placement of the Foley catheter.  No clear medication triggers for this.  Discussed that it is possible that her symptoms may be related to spinal pathology, stress, medication, swelling and given that she is currently on hospice and determined not to be surgical candidate do not feel more  aggressive evaluation for etiology is indicated at this time. ? ?They are interested in obtaining lab work and urinalysis. ? ?Labs obtained and evaluated by me without clinically significant changes, no UTI. ? ?Pla

## 2021-08-07 NOTE — Progress Notes (Signed)
[place] ([facility])   ?    ?This patient is a current hospice patient with [facility], admitted with a terminal diagnosis  Multiple myeloma ? ?  ?[facility] will continue to follow for any discharge planning needs and to coordinate continuation of hospice care.  ?   ?Please call with any questions/concerns.  ?  ?Thank you for the opportunity to participate in this patient's care. ? ?Daphene Calamity, MSW ?[facility] Liaison  ?[phone] ? ? ?

## 2021-08-09 LAB — URINE CULTURE: Culture: NO GROWTH

## 2021-08-10 ENCOUNTER — Other Ambulatory Visit: Payer: Medicare Other

## 2021-08-12 ENCOUNTER — Inpatient Hospital Stay: Payer: Medicare Other | Admitting: Oncology

## 2021-08-12 ENCOUNTER — Inpatient Hospital Stay: Payer: Medicare Other

## 2021-08-28 ENCOUNTER — Other Ambulatory Visit: Payer: Self-pay | Admitting: *Deleted

## 2021-08-28 ENCOUNTER — Telehealth: Payer: Self-pay | Admitting: *Deleted

## 2021-08-28 MED ORDER — FLUCONAZOLE 100 MG PO TABS: 100.0000 mg | ORAL_TABLET | Freq: Every day | ORAL | 0 refills | Status: AC

## 2021-08-28 NOTE — Telephone Encounter (Signed)
Got a call from daughter and she states that pt. Has thrush in mouth and the lozenge is not working for her and they wanted to know if pt could have diflucan. I called [facility]  and after 2 times trying I did get mitzi and she gave me her cell phone [phone]. She says that difucan is covered and she can have 7 days. RX went to [facility]. Then called family and spoke to the daughter and they know that it is covered and told her that sometimes it takes 1-2 days to get better and other are longer. But she has 7 days if she needs it ?

## 2021-09-02 ENCOUNTER — Telehealth: Payer: Self-pay | Admitting: Family Medicine

## 2021-09-02 NOTE — Telephone Encounter (Signed)
Spoke with husband [name] to express my condolences.  ?Remer Macho was today. ?

## 2021-09-07 DEATH — deceased

## 2021-10-26 ENCOUNTER — Ambulatory Visit: Payer: Medicare Other

## 2022-04-26 ENCOUNTER — Telehealth: Payer: Self-pay | Admitting: Family Medicine

## 2022-04-26 NOTE — Telephone Encounter (Signed)
Form filled and in Lisa's box.  Pt passed away 09-19-2021.

## 2022-04-26 NOTE — Telephone Encounter (Signed)
Placed form in Dr. Synthia Innocent box.

## 2022-04-26 NOTE — Telephone Encounter (Signed)
Faxed form to [employer] [facility] at [phone].

## 2022-04-26 NOTE — Telephone Encounter (Signed)
A form on behalf of pt came in the mail from [facility]. Form will be in pcp box

## 2023-01-19 IMAGING — DX DG THORACIC SPINE 3V
3 series · 3 of 3 positions shown · non-contrast
Comparison: 04/07/2020

CLINICAL DATA: Acute on chronic severe pain

EXAM:
THORACIC SPINE - 3 VIEWS

[t-spine ap]
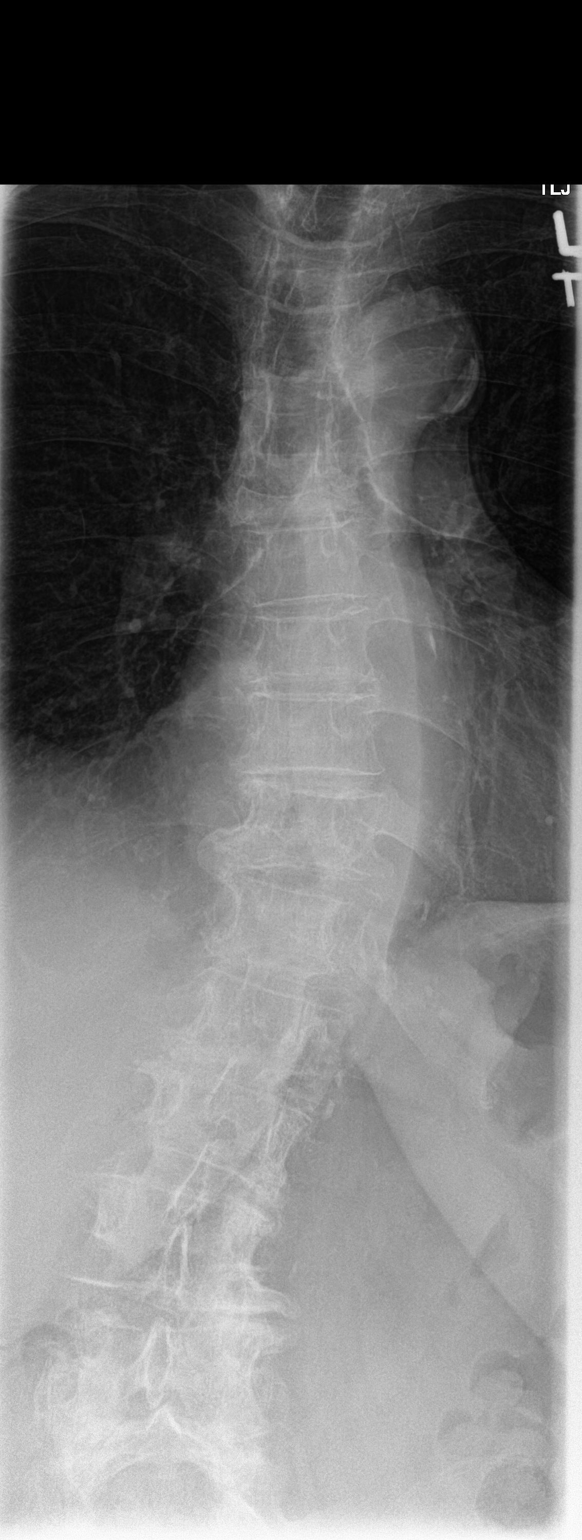

[t-spine lat]
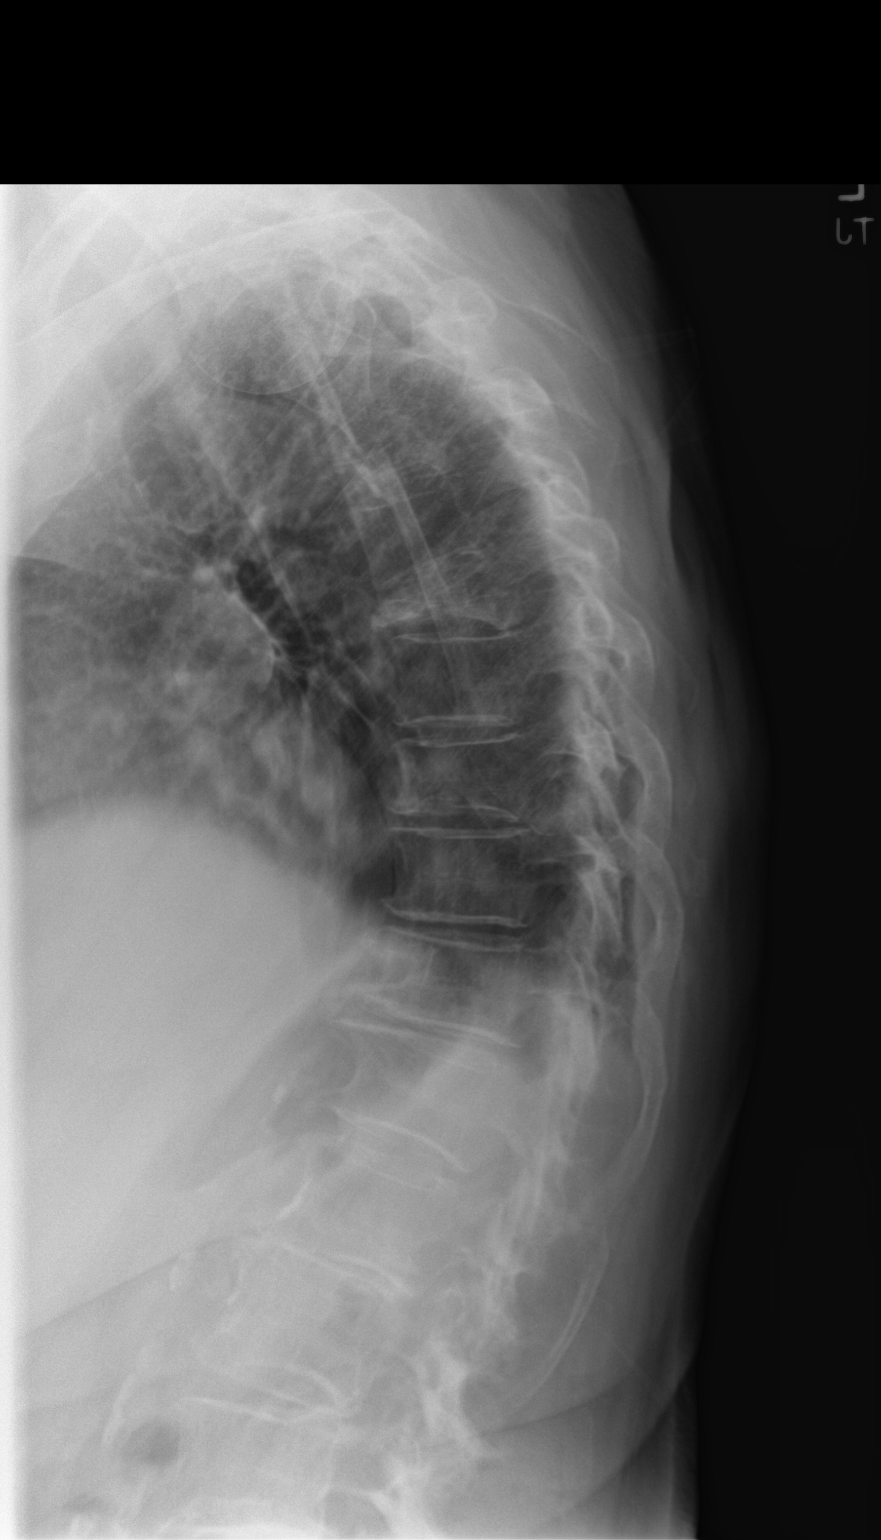

[t-spine swimmers]
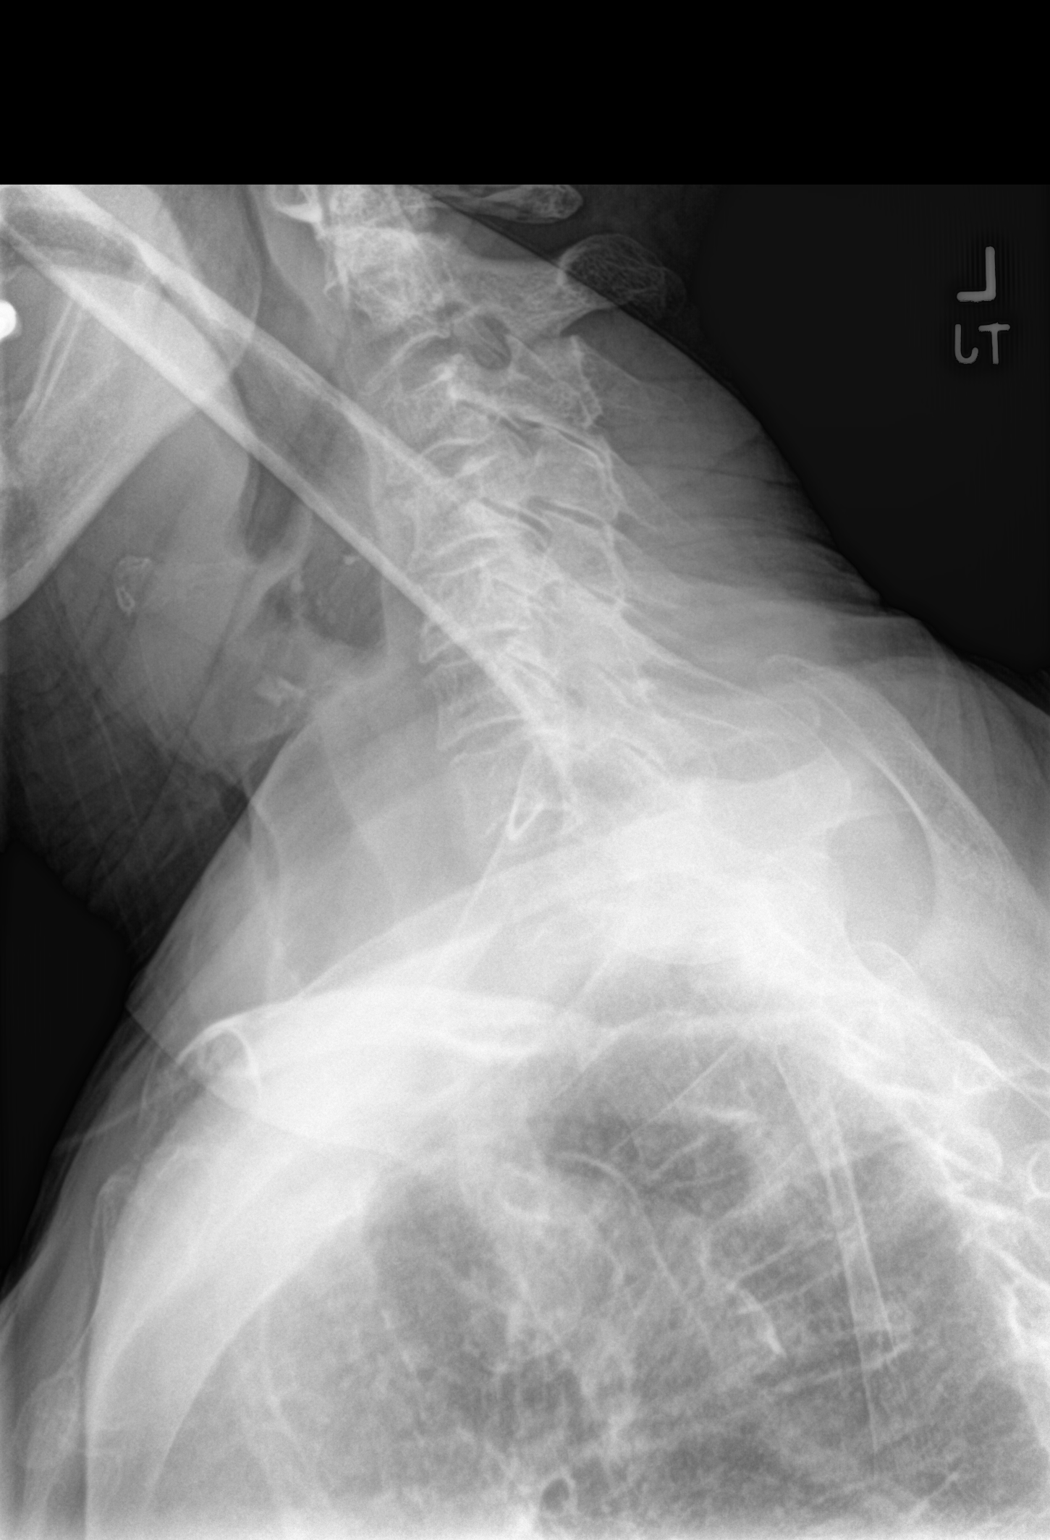

[3 of 3 positions shown; findings below may reference images not displayed]

FINDINGS: Remote T5, T7, and T11 compression fractures with advanced height
loss at T5 and T7 where there is also sclerosis. T9 inferior
endplate fracture that is new from March 2020 with endplate
depression only seen to a mild degree. No evidence of bone lesion or
erosion. Thoracolumbar scoliosis and generalized degenerative disc
space narrowing with ridging.
IMPRESSION: 1. T9 inferior endplate fracture which is new from March 2020 and
age-indeterminate.
2. Remote T5, T7, and T11 compression fractures.

## 2023-01-19 IMAGING — DX DG LUMBAR SPINE COMPLETE 4+V
5 series · 5 of 5 positions shown · non-contrast
Comparison: None.

CLINICAL DATA: Acute on chronic severe pain

EXAM:
LUMBAR SPINE - COMPLETE 4+ VIEW

[l-spine ap]
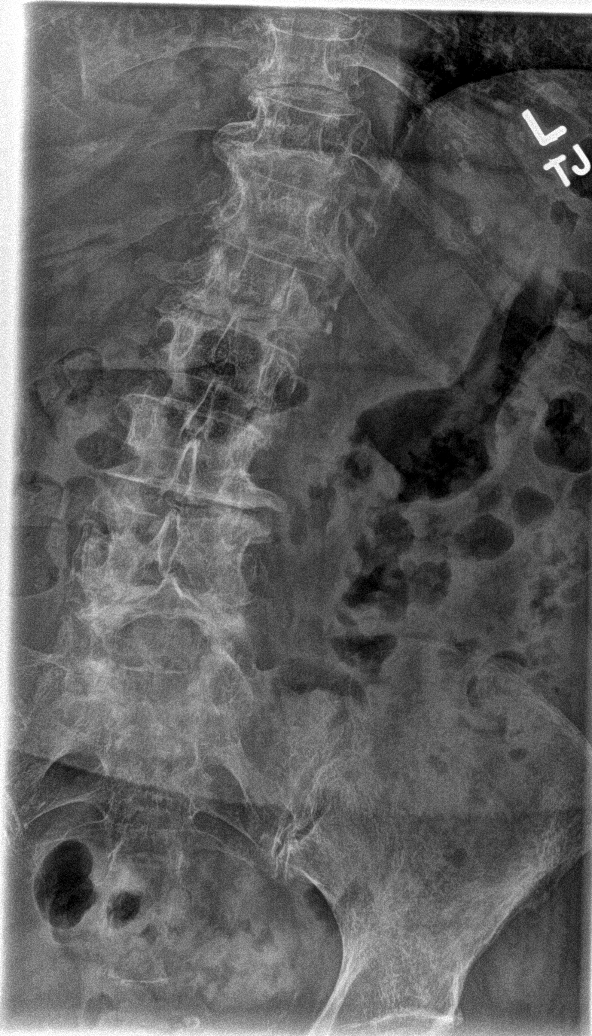

[l-spine obl (1 of 2)]
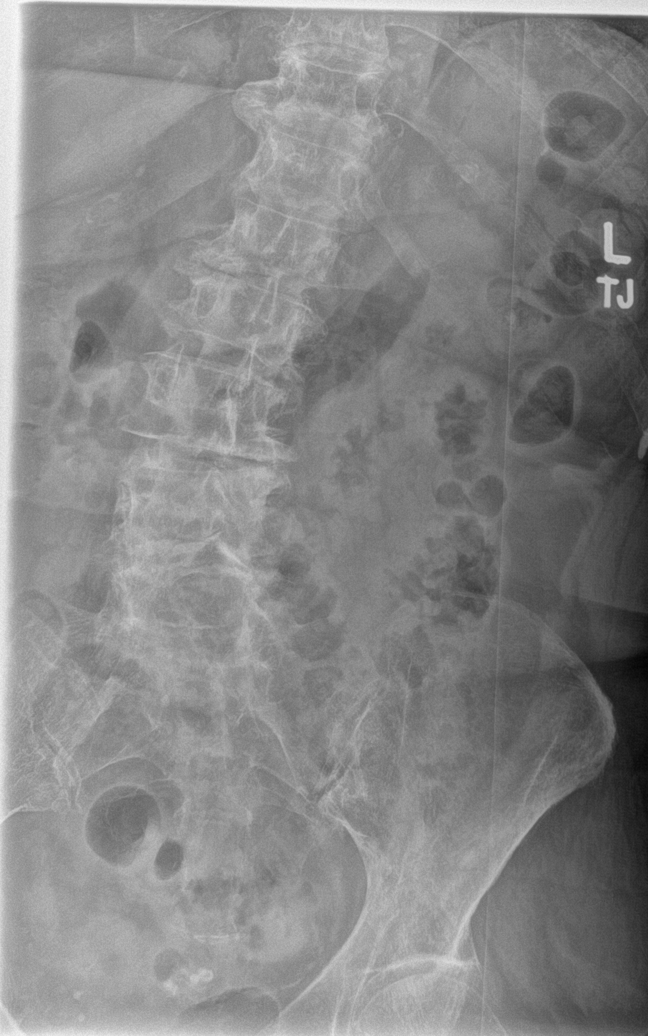

[l-spine obl (2 of 2)]
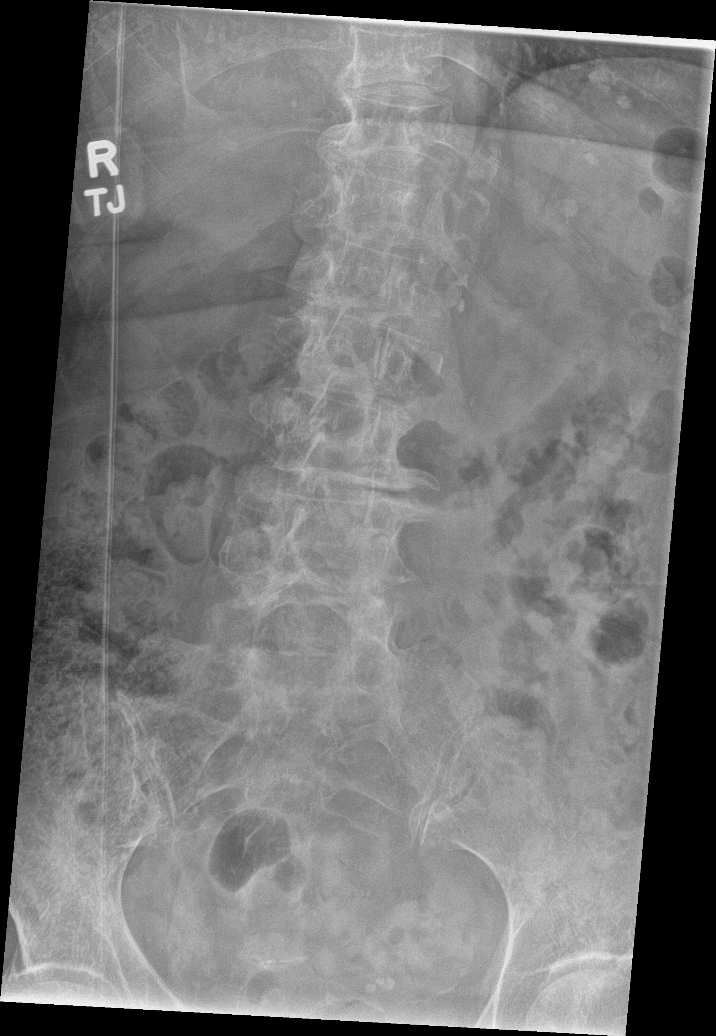

[l-spine lat]
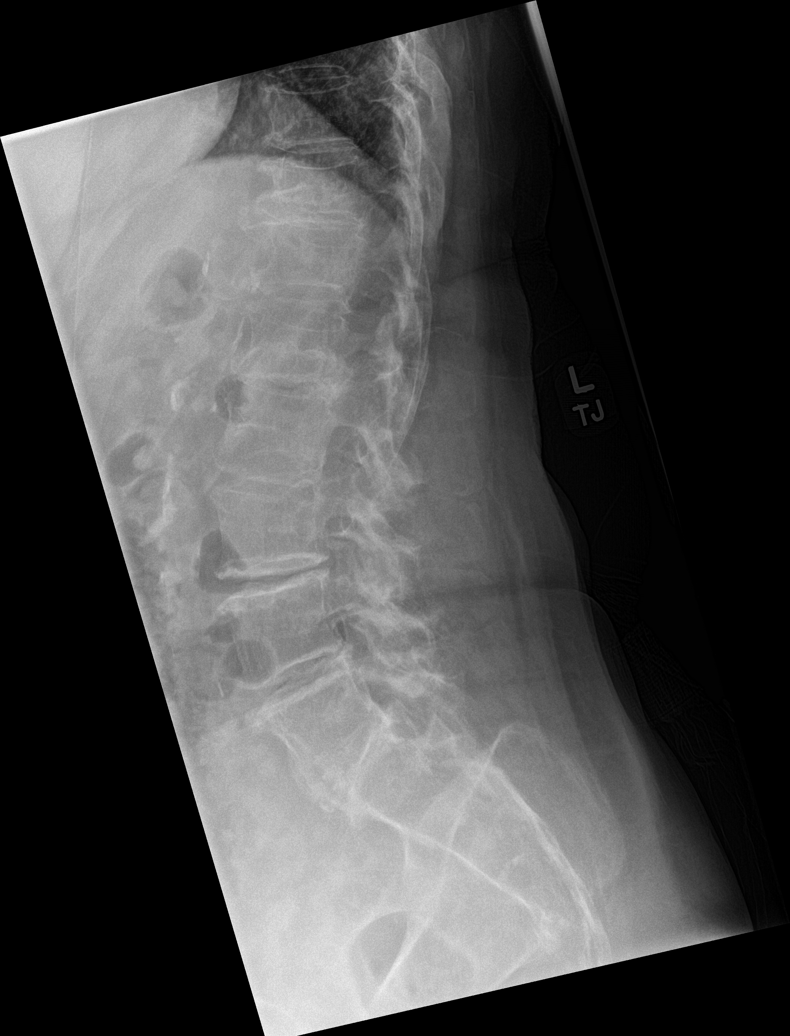

[l-spine l5/s1]
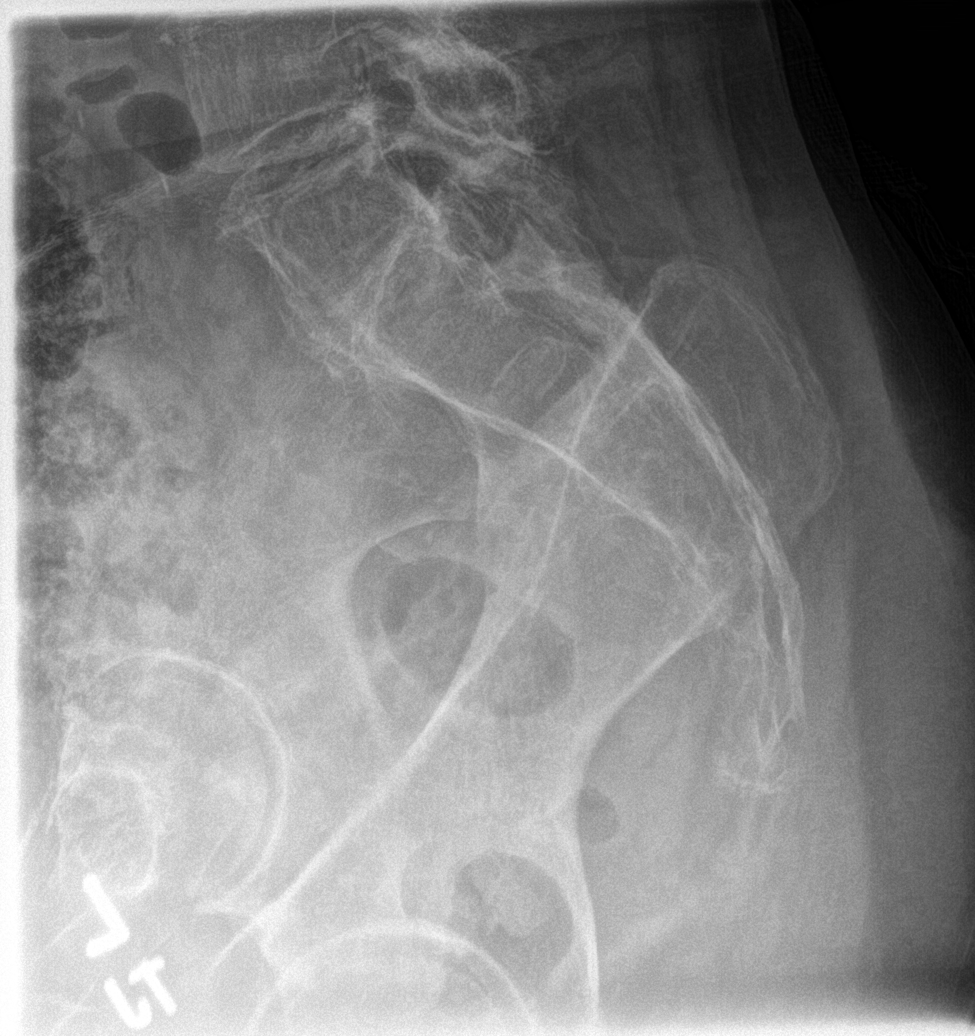

[5 of 5 positions shown; findings below may reference images not displayed]

FINDINGS: T12 wedging as described on dedicated thoracic study.

No acute lumbar spine fracture is seen.

Diffuse disc narrowing and endplate spurring with scoliosis.
Multilevel degenerative facet spurring. Prominent osteopenia.
IMPRESSION: 1. No acute finding in the lumbar spine.
2. Radiographic sensitivity is definitely diminished by osteopenia
and advanced degenerative disease with scoliosis.

## 2023-05-12 NOTE — Telephone Encounter (Signed)
 Error
